# Patient Record
Sex: Female | Born: 1995 | Race: Black or African American | Hispanic: No | Marital: Single | Smoking: Former smoker
Health system: Southern US, Community
[De-identification: ages and names within clinical notes are randomized; demographics above are authoritative.]

## PROBLEM LIST (undated history)

## (undated) ENCOUNTER — Inpatient Hospital Stay (HOSPITAL_COMMUNITY): Payer: Self-pay

## (undated) DIAGNOSIS — J45909 Unspecified asthma, uncomplicated: Secondary | ICD-10-CM

## (undated) DIAGNOSIS — A749 Chlamydial infection, unspecified: Secondary | ICD-10-CM

## (undated) DIAGNOSIS — A549 Gonococcal infection, unspecified: Secondary | ICD-10-CM

## (undated) DIAGNOSIS — A599 Trichomoniasis, unspecified: Secondary | ICD-10-CM

## (undated) DIAGNOSIS — K219 Gastro-esophageal reflux disease without esophagitis: Secondary | ICD-10-CM

## (undated) DIAGNOSIS — R569 Unspecified convulsions: Secondary | ICD-10-CM

## (undated) DIAGNOSIS — L708 Other acne: Secondary | ICD-10-CM

## (undated) DIAGNOSIS — F419 Anxiety disorder, unspecified: Secondary | ICD-10-CM

## (undated) DIAGNOSIS — E282 Polycystic ovarian syndrome: Secondary | ICD-10-CM

## (undated) DIAGNOSIS — I10 Essential (primary) hypertension: Secondary | ICD-10-CM

## (undated) DIAGNOSIS — A4902 Methicillin resistant Staphylococcus aureus infection, unspecified site: Secondary | ICD-10-CM

## (undated) DIAGNOSIS — R519 Headache, unspecified: Secondary | ICD-10-CM

## (undated) HISTORY — PX: ROOT CANAL: SHX2363

## (undated) HISTORY — PX: ADENOIDECTOMY: SUR15

## (undated) HISTORY — PX: TONSILLECTOMY: SUR1361

## (undated) HISTORY — PX: WISDOM TOOTH EXTRACTION: SHX21

## (undated) HISTORY — PX: NASAL SEPTOPLASTY W/ TURBINOPLASTY: SHX2070

## (undated) HISTORY — DX: Essential (primary) hypertension: I10

---
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
## 2016-01-17 ENCOUNTER — Emergency Department (HOSPITAL_COMMUNITY)
Admission: EM | Admit: 2016-01-17 | Discharge: 2016-01-18 | Disposition: A | Discharge status: Home / Self Care | Payer: BLUE CROSS/BLUE SHIELD | Attending: Emergency Medicine | Admitting: Emergency Medicine

## 2016-01-17 ENCOUNTER — Encounter (HOSPITAL_COMMUNITY): Payer: Self-pay | Admitting: *Deleted

## 2016-01-17 DIAGNOSIS — B9689 Other specified bacterial agents as the cause of diseases classified elsewhere: Secondary | ICD-10-CM | POA: Insufficient documentation

## 2016-01-17 DIAGNOSIS — N760 Acute vaginitis: Secondary | ICD-10-CM | POA: Diagnosis not present

## 2016-01-17 DIAGNOSIS — Z79899 Other long term (current) drug therapy: Secondary | ICD-10-CM | POA: Diagnosis not present

## 2016-01-17 DIAGNOSIS — Z91040 Latex allergy status: Secondary | ICD-10-CM | POA: Diagnosis not present

## 2016-01-17 DIAGNOSIS — F17200 Nicotine dependence, unspecified, uncomplicated: Secondary | ICD-10-CM | POA: Diagnosis not present

## 2016-01-17 DIAGNOSIS — N898 Other specified noninflammatory disorders of vagina: Secondary | ICD-10-CM | POA: Diagnosis present

## 2016-01-17 DIAGNOSIS — J45909 Unspecified asthma, uncomplicated: Secondary | ICD-10-CM | POA: Insufficient documentation

## 2016-01-17 HISTORY — DX: Unspecified asthma, uncomplicated: J45.909

## 2016-01-17 LAB — WET PREP, GENITAL
Sperm: NONE SEEN
TRICH WET PREP: NONE SEEN
YEAST WET PREP: NONE SEEN

## 2016-01-17 NOTE — ED Triage Notes (Signed)
Pt c/o vaginal itching, discharge, treated for chlamydia 2 months ago

## 2016-01-17 NOTE — ED Provider Notes (Signed)
[facility]-EMERGENCY DEPT Provider Note   [name]: [id number] Arrival date & time: 01/17/16  2259  By signing my name below, I, Vista Minkobert Ross, attest that this documentation has been prepared under the direction and in the presence of No att. providers found. Electronically signed, Vista Minkobert Ross, ED Scribe. 01/17/16. 11:30 PM.  History   Chief Complaint Chief Complaint  Patient presents with  . Vaginal Itching    HPI HPI Comments: Duffy RhodyBranisha Podolak is a 21 y.o. female, with Hx of vaginosis, chlamydia, who presents to the Emergency Department complaining of persistent vaginal itching and discharge that started last week. Pt states that her symptoms feel similar to prior yeast infection that she had last year. She was recently treated for chlamydia last month. She never had symptoms but was dx and treated. Pt's partner also received treatment for chlamydia. No fever.  The history is provided by the patient. No language interpreter was used.   Past Medical History:  Diagnosis Date  . Asthma    There are no active problems to display for this patient.   Past Surgical History:  Procedure Laterality Date  . TONSILLECTOMY      OB History    No data available       Home Medications    Prior to Admission medications   Medication Sig Start Date End Date Taking? Authorizing Provider  JUNEL FE 1/20 1-20 MG-MCG tablet Take 1 tablet by mouth daily. 12/19/15  Yes Historical Provider, MD  metroNIDAZOLE (FLAGYL) 500 MG tablet Take 1 tablet (500 mg total) by mouth 2 (two) times daily. 01/18/16   Rolland PorterMark Vee Bahe, MD  metroNIDAZOLE (FLAGYL) 500 MG tablet Take 1 tablet (500 mg total) by mouth 2 (two) times daily. 01/18/16   Rolland PorterMark Coriann Brouhard, MD    Family History No family history on file.  Social History Social History  Substance Use Topics  . Smoking status: Current Some Day Smoker  . Smokeless tobacco: Never Used  . Alcohol use No    Allergies   Latex and Red dye   Review of Systems Review of Systems    Constitutional: Negative for appetite change, chills, diaphoresis, fatigue and fever.  HENT: Negative for mouth sores, sore throat and trouble swallowing.   Eyes: Negative for visual disturbance.  Respiratory: Negative for cough, chest tightness, shortness of breath and wheezing.   Cardiovascular: Negative for chest pain.  Gastrointestinal: Negative for abdominal distention, abdominal pain, diarrhea, nausea and vomiting.  Endocrine: Negative for polydipsia, polyphagia and polyuria.  Genitourinary: Positive for vaginal discharge. Negative for dysuria, frequency and hematuria.  Musculoskeletal: Negative for gait problem.  Skin: Negative for color change, pallor and rash.  Neurological: Negative for dizziness, syncope, light-headedness and headaches.  Hematological: Does not bruise/bleed easily.  Psychiatric/Behavioral: Negative for behavioral problems and confusion.     Physical Exam Updated Vital Signs BP 143/95   Pulse 99   Temp 98.9 F (37.2 C) (Oral)   Resp 16   LMP 11/17/2015   SpO2 100%   Physical Exam  Constitutional: She is oriented to person, place, and time. She appears well-developed and well-nourished. No distress.  HENT:  Head: Normocephalic.  Eyes: Conjunctivae are normal. Pupils are equal, round, and reactive to light. No scleral icterus.  Neck: Normal range of motion. Neck supple. No thyromegaly present.  Cardiovascular: Normal rate and regular rhythm.  Exam reveals no gallop and no friction rub.   No murmur heard. Pulmonary/Chest: Effort normal and breath sounds normal. No respiratory distress. She has no wheezes. She  has no rales.  Abdominal: Soft. Bowel sounds are normal. She exhibits no distension. There is no tenderness. There is no rebound.  Genitourinary:  Genitourinary Comments: Pelvic exam: Thick yellow discharge.  No CMT. No external lesions or erythema.  Musculoskeletal: Normal range of motion.  Neurological: She is alert and oriented to person,  place, and time.  Skin: Skin is warm and dry. No rash noted.  Psychiatric: She has a normal mood and affect. Her behavior is normal.     ED Treatments / Results  DIAGNOSTIC STUDIES: Oxygen Saturation is 100% on RA, normal by my interpretation.  COORDINATION OF CARE: 11:29 PM-Discussed treatment plan with pt at bedside and pt agreed to plan.   Labs (all labs ordered are listed, but only abnormal results are displayed) Labs Reviewed  WET PREP, GENITAL - Abnormal; Notable for the following:       Result Value   Clue Cells Wet Prep HPF POC PRESENT (*)    WBC, Wet Prep HPF POC FEW (*)    All other components within normal limits  POC URINE PREG, ED  GC/CHLAMYDIA PROBE AMP ([place]) NOT AT [facility]    EKG  EKG Interpretation None       Radiology No results found.  Procedures Procedures (including critical care time)  Medications Ordered in ED Medications - No data to display   Initial Impression / Assessment and Plan / ED Course  I have reviewed the triage vital signs and the nursing notes.  Pertinent labs & imaging results that were available during my care of the patient were reviewed by me and considered in my medical decision making (see chart for details).  Clinical Course       Final Clinical Impressions(s) / ED Diagnoses   Final diagnoses:  BV (bacterial vaginosis)    New Prescriptions Discharge Medication List as of 01/18/2016 12:37 AM    START taking these medications   Details  metroNIDAZOLE (FLAGYL) 500 MG tablet Take 1 tablet (500 mg total) by mouth 2 (two) times daily., Starting Thu 01/18/2016, Print      I personally performed the services described in this documentation, which was scribed in my presence. The recorded information has been reviewed and is accurate.     Rolland Porter, MD 01/18/16 [phone]

## 2016-01-18 DIAGNOSIS — N760 Acute vaginitis: Secondary | ICD-10-CM | POA: Diagnosis not present

## 2016-01-18 LAB — POC URINE PREG, ED: PREG TEST UR: NEGATIVE

## 2016-01-18 LAB — GC/CHLAMYDIA PROBE AMP (~~LOC~~) NOT AT ARMC
CHLAMYDIA, DNA PROBE: NEGATIVE
Neisseria Gonorrhea: NEGATIVE

## 2016-01-18 MED ORDER — METRONIDAZOLE 500 MG PO TABS: 500.0000 mg | ORAL_TABLET | Freq: Two times a day (BID) | ORAL | 0 refills | Status: DC

## 2016-01-18 NOTE — ED Notes (Signed)
Patient is alert and oriented x3.  She was given DC instructions and follow up visit instructions.  Patient gave verbal understanding. She was DC ambulatory under her own power to home.  V/S stable.  He was not showing any signs of distress on DC 

## 2016-01-18 NOTE — Discharge Instructions (Signed)
Flagyl prescription 10 days. Absolute no alcohol while taking Flagyl.  I recommend OB/GYN follow-up. GYN maybe able to explain your recurrent episodes of BV, and possibly help prevent future episodes.  You have been given the name and number of the [facility]. Call for outpatient appointment.

## 2016-09-14 ENCOUNTER — Encounter (HOSPITAL_COMMUNITY): Payer: Self-pay | Admitting: Nurse Practitioner

## 2016-09-14 DIAGNOSIS — A599 Trichomoniasis, unspecified: Secondary | ICD-10-CM | POA: Diagnosis not present

## 2016-09-14 DIAGNOSIS — J45909 Unspecified asthma, uncomplicated: Secondary | ICD-10-CM | POA: Insufficient documentation

## 2016-09-14 DIAGNOSIS — Z91040 Latex allergy status: Secondary | ICD-10-CM | POA: Diagnosis not present

## 2016-09-14 DIAGNOSIS — F17200 Nicotine dependence, unspecified, uncomplicated: Secondary | ICD-10-CM | POA: Insufficient documentation

## 2016-09-14 DIAGNOSIS — B9689 Other specified bacterial agents as the cause of diseases classified elsewhere: Secondary | ICD-10-CM | POA: Insufficient documentation

## 2016-09-14 DIAGNOSIS — N760 Acute vaginitis: Secondary | ICD-10-CM | POA: Insufficient documentation

## 2016-09-14 DIAGNOSIS — B373 Candidiasis of vulva and vagina: Secondary | ICD-10-CM | POA: Insufficient documentation

## 2016-09-14 DIAGNOSIS — N898 Other specified noninflammatory disorders of vagina: Secondary | ICD-10-CM | POA: Diagnosis present

## 2016-09-14 NOTE — ED Triage Notes (Signed)
Pt is reporting that she has colorless, odorless vaginal discharge and discomfort. Onset she reports as a week ago, adds that she is sexually active, unprotected sex would like to checked for STD and pregnancy.

## 2016-09-15 ENCOUNTER — Emergency Department (HOSPITAL_COMMUNITY)
Admission: EM | Admit: 2016-09-15 | Discharge: 2016-09-15 | Disposition: A | Discharge status: Home / Self Care | Payer: Medicaid Other | Attending: Emergency Medicine | Admitting: Emergency Medicine

## 2016-09-15 DIAGNOSIS — B3731 Acute candidiasis of vulva and vagina: Secondary | ICD-10-CM

## 2016-09-15 DIAGNOSIS — B373 Candidiasis of vulva and vagina: Secondary | ICD-10-CM

## 2016-09-15 DIAGNOSIS — N760 Acute vaginitis: Secondary | ICD-10-CM

## 2016-09-15 DIAGNOSIS — B9689 Other specified bacterial agents as the cause of diseases classified elsewhere: Secondary | ICD-10-CM

## 2016-09-15 DIAGNOSIS — A599 Trichomoniasis, unspecified: Secondary | ICD-10-CM

## 2016-09-15 LAB — URINALYSIS, ROUTINE W REFLEX MICROSCOPIC
Bacteria, UA: NONE SEEN
Bilirubin Urine: NEGATIVE
Glucose, UA: NEGATIVE mg/dL
Hgb urine dipstick: NEGATIVE
Ketones, ur: NEGATIVE mg/dL
NITRITE: NEGATIVE
PH: 8 (ref 5.0–8.0)
PROTEIN: 30 mg/dL — AB
SPECIFIC GRAVITY, URINE: 1.024 (ref 1.005–1.030)

## 2016-09-15 LAB — WET PREP, GENITAL
Sperm: NEGATIVE
Trich, Wet Prep: NEGATIVE — AB

## 2016-09-15 LAB — RPR: RPR: NONREACTIVE

## 2016-09-15 LAB — PREGNANCY, URINE: PREG TEST UR: NEGATIVE

## 2016-09-15 LAB — HIV ANTIBODY (ROUTINE TESTING W REFLEX): HIV SCREEN 4TH GENERATION: NONREACTIVE

## 2016-09-15 MED ORDER — METRONIDAZOLE 500 MG PO TABS
500.0000 mg | ORAL_TABLET | Freq: Once | ORAL | Status: AC
  Administered 2016-09-15: 500 mg via ORAL
  Filled 2016-09-15: qty 1

## 2016-09-15 MED ORDER — METRONIDAZOLE 500 MG PO TABS: 500.0000 mg | ORAL_TABLET | Freq: Two times a day (BID) | ORAL | 0 refills | Status: DC

## 2016-09-15 MED ORDER — FLUCONAZOLE 100 MG PO TABS
100.0000 mg | ORAL_TABLET | Freq: Once | ORAL | Status: AC
  Administered 2016-09-15: 100 mg via ORAL
  Filled 2016-09-15: qty 1

## 2016-09-15 NOTE — Discharge Instructions (Signed)
You were not tested for all STDs today. Your gonorrhea and chlamydia tests are pending- if they are positive, you will receive a phone call. Refrain from sex until you have the results from a full STD screen. Please go to [facility] (Address: [address], [place], [place] [address] Phone: [phone]) or see the [facility] (Address: [address] Phone: [phone]) for full STD screening. Return to the emergency room for worsening of symptoms, fever, and vomiting.

## 2016-09-15 NOTE — ED Provider Notes (Signed)
[facility]-EMERGENCY DEPT Provider Note   [name]: [id number] Arrival date & time: 09/14/16  2209     History   Chief Complaint Chief Complaint  Patient presents with  . Vaginal Discharge     HPI   Blood pressure 135/90, pulse 79, temperature 98.2 F (36.8 C), temperature source Oral, resp. rate 18, height 5\' 4"  (1.626 m), weight 123.8 kg (273 lb), SpO2 100 %.  Colleen Stone is a 21 y.o. female complaining of colorless odorless vaginal discharge onset approximately 4 days ago. She regularly has unprotected sex with her boyfriend, she is monogamous with him. She denies any abdominal pain, fever, chills, nausea, vomiting, dysuria, hematuria, urinary frequency. She states that she does not have an OB/GYN and is frequently affected with bacterial vaginosis.  Past Medical History:  Diagnosis Date  . Asthma     There are no active problems to display for this patient.   Past Surgical History:  Procedure Laterality Date  . TONSILLECTOMY      OB History    No data available       Home Medications    Prior to Admission medications   Medication Sig Start Date End Date Taking? Authorizing Provider  metroNIDAZOLE (FLAGYL) 500 MG tablet Take 1 tablet (500 mg total) by mouth 2 (two) times daily. One tab PO bid x 10 days 09/15/16   Percy Winterrowd, Mardella Layman    Family History History reviewed. No pertinent family history.  Social History Social History  Substance Use Topics  . Smoking status: Current Some Day Smoker  . Smokeless tobacco: Never Used  . Alcohol use No     Allergies   Latex and Red dye   Review of Systems Review of Systems  A complete review of systems was obtained and all systems are negative except as noted in the HPI and PMH.    Physical Exam Updated Vital Signs BP (!) 145/93 (BP Location: Left Arm)   Pulse 88   Temp 98.2 F (36.8 C) (Oral)   Resp 17   Ht 5\' 4"  (1.626 m)   Wt 123.8 kg (273 lb)   SpO2 100%   BMI 46.86 kg/m   Physical Exam    Constitutional: She is oriented to person, place, and time. She appears well-developed and well-nourished. No distress.  HENT:  Head: Normocephalic and atraumatic.  Mouth/Throat: Oropharynx is clear and moist.  Eyes: Pupils are equal, round, and reactive to light. Conjunctivae and EOM are normal.  Neck: Normal range of motion.  Cardiovascular: Normal rate, regular rhythm and intact distal pulses.   Pulmonary/Chest: Effort normal and breath sounds normal.  Abdominal: Soft. She exhibits no distension and no mass. There is no tenderness. There is no rebound and no guarding. No hernia.  Genitourinary:  Genitourinary Comments: Pelvic exam a chaperoned by technician: No rashes or lesions, scant, non-foul-smelling discharge no cervical motion or adnexal tenderness.  Musculoskeletal: Normal range of motion.  Neurological: She is alert and oriented to person, place, and time.  Skin: Capillary refill takes less than 2 seconds. She is not diaphoretic.  Psychiatric: She has a normal mood and affect.  Nursing note and vitals reviewed.    ED Treatments / Results  Labs (all labs ordered are listed, but only abnormal results are displayed) Labs Reviewed  WET PREP, GENITAL - Abnormal; Notable for the following:       Result Value   Yeast Wet Prep HPF POC PRESENT (*)    Trich, Wet Prep NEGATIVE (*)  Clue Cells Wet Prep HPF POC PRESENT (*)    WBC, Wet Prep HPF POC MODERATE (*)    All other components within normal limits  URINALYSIS, ROUTINE W REFLEX MICROSCOPIC - Abnormal; Notable for the following:    APPearance CLOUDY (*)    Protein, ur 30 (*)    Leukocytes, UA MODERATE (*)    Squamous Epithelial / LPF 0-5 (*)    All other components within normal limits  PREGNANCY, URINE  RPR  HIV ANTIBODY (ROUTINE TESTING)  GC/CHLAMYDIA PROBE AMP ([place]) NOT AT [facility]    EKG  EKG Interpretation None       Radiology No results found.  Procedures Procedures (including critical care  time)  Medications Ordered in ED Medications  fluconazole (DIFLUCAN) tablet 100 mg (100 mg Oral Given 09/15/16 0431)  metroNIDAZOLE (FLAGYL) tablet 500 mg (500 mg Oral Given 09/15/16 0431)     Initial Impression / Assessment and Plan / ED Course  I have reviewed the triage vital signs and the nursing notes.  Pertinent labs & imaging results that were available during my care of the patient were reviewed by me and considered in my medical decision making (see chart for details).     Vitals:   09/14/16 2244 09/15/16 0413  BP: 135/90 (!) 145/93  Pulse: 79 88  Resp: 18 17  Temp: 98.2 F (36.8 C)   TempSrc: Oral   SpO2: 100% 100%  Weight: 123.8 kg (273 lb)   Height: 5\' 4"  (1.626 m)     Medications  fluconazole (DIFLUCAN) tablet 100 mg (100 mg Oral Given 09/15/16 0431)  metroNIDAZOLE (FLAGYL) tablet 500 mg (500 mg Oral Given 09/15/16 0431)    Colleen Stone is 21 y.o. female presenting with Vaginal discharge onset several days ago. Abdominal exam is benign, no signs of systemic infection.  Wet prep with yeast, trichomoniasis and clue cells. Urinalysis with moderate leukocytes negative nitrites, think this is likely contamination from the vaginal area. She's having no urinary symptoms. Advised patient to refrain from sex will she is being treated for trichomonas and advised her her partner will need treatment as well. [facility].  Evaluation does not show pathology that would require ongoing emergent intervention or inpatient treatment. Pt is hemodynamically stable and mentating appropriately. Discussed findings and plan with patient/guardian, who agrees with care plan. All questions answered. Return precautions discussed and outpatient follow up given.    Final Clinical Impressions(s) / ED Diagnoses   Final diagnoses:  BV (bacterial vaginosis)  Trichimoniasis  Yeast infection of the vagina    New Prescriptions New Prescriptions   METRONIDAZOLE (FLAGYL)  500 MG TABLET    Take 1 tablet (500 mg total) by mouth 2 (two) times daily. One tab PO bid x 10 days     Kaylyn Limisciotta, Nieves Chapa, PA-C 09/15/16 0444    Mancel BaleWentz, Elliott, MD 09/15/16 [id number]

## 2016-09-19 LAB — GC/CHLAMYDIA PROBE AMP (~~LOC~~) NOT AT ARMC
CHLAMYDIA, DNA PROBE: POSITIVE — AB
Neisseria Gonorrhea: NEGATIVE

## 2016-09-20 ENCOUNTER — Telehealth: Payer: Self-pay | Admitting: Student

## 2016-09-20 DIAGNOSIS — A749 Chlamydial infection, unspecified: Secondary | ICD-10-CM

## 2016-09-20 MED ORDER — AZITHROMYCIN 500 MG PO TABS: 1000.0000 mg | ORAL_TABLET | Freq: Once | ORAL | 0 refills | Status: AC

## 2016-09-20 NOTE — Telephone Encounter (Addendum)
Colleen Stone tested positive for  Chlamydia. Patient was called by RN and allergies and pharmacy confirmed. Rx sent to pharmacy of choice.   Judeth HornLawrence, Alishia Lebo, NP 09/20/2016 3:36 PM          ----- Message from Kathe BectonLori S Berdik, RN sent at 09/20/2016  3:34 PM EDT ----- This patient tested positive for :"Chlamydia",   She :","is allergic to latex, Red dye , I have informed the patient of her results and confirmed her pharmacy is correct in her chart. Please send Rx.   Thank you,   Kathe BectonBerdik, Lori S, RN   [facility].

## 2016-10-04 ENCOUNTER — Encounter (HOSPITAL_COMMUNITY): Payer: Self-pay | Admitting: Emergency Medicine

## 2016-10-04 DIAGNOSIS — N898 Other specified noninflammatory disorders of vagina: Secondary | ICD-10-CM | POA: Insufficient documentation

## 2016-10-04 DIAGNOSIS — N730 Acute parametritis and pelvic cellulitis: Secondary | ICD-10-CM | POA: Insufficient documentation

## 2016-10-04 DIAGNOSIS — R1030 Lower abdominal pain, unspecified: Secondary | ICD-10-CM | POA: Diagnosis present

## 2016-10-04 DIAGNOSIS — J45909 Unspecified asthma, uncomplicated: Secondary | ICD-10-CM | POA: Insufficient documentation

## 2016-10-04 DIAGNOSIS — F17210 Nicotine dependence, cigarettes, uncomplicated: Secondary | ICD-10-CM | POA: Insufficient documentation

## 2016-10-04 DIAGNOSIS — Z7251 High risk heterosexual behavior: Secondary | ICD-10-CM | POA: Insufficient documentation

## 2016-10-04 DIAGNOSIS — Z91040 Latex allergy status: Secondary | ICD-10-CM | POA: Diagnosis not present

## 2016-10-04 LAB — LIPASE, BLOOD: LIPASE: 26 U/L (ref 11–51)

## 2016-10-04 LAB — URINALYSIS, ROUTINE W REFLEX MICROSCOPIC
BILIRUBIN URINE: NEGATIVE
GLUCOSE, UA: NEGATIVE mg/dL
HGB URINE DIPSTICK: NEGATIVE
KETONES UR: NEGATIVE mg/dL
LEUKOCYTES UA: NEGATIVE
NITRITE: NEGATIVE
Protein, ur: 30 mg/dL — AB
Specific Gravity, Urine: 1.026 (ref 1.005–1.030)
pH: 5 (ref 5.0–8.0)

## 2016-10-04 LAB — CBC
HEMATOCRIT: 40.6 % (ref 36.0–46.0)
HEMOGLOBIN: 13.4 g/dL (ref 12.0–15.0)
MCH: 27 pg (ref 26.0–34.0)
MCHC: 33 g/dL (ref 30.0–36.0)
MCV: 81.7 fL (ref 78.0–100.0)
Platelets: 341 10*3/uL (ref 150–400)
RBC: 4.97 MIL/uL (ref 3.87–5.11)
RDW: 14.4 % (ref 11.5–15.5)
WBC: 7.1 10*3/uL (ref 4.0–10.5)

## 2016-10-04 LAB — I-STAT BETA HCG BLOOD, ED (MC, WL, AP ONLY): I-stat hCG, quantitative: <5 m[IU]/mL (ref <5)

## 2016-10-04 LAB — COMPREHENSIVE METABOLIC PANEL
ALT: 19 U/L (ref 14–54)
ANION GAP: 7 (ref 5–15)
AST: 20 U/L (ref 15–41)
Albumin: 3.5 g/dL (ref 3.5–5.0)
Alkaline Phosphatase: 84 U/L (ref 38–126)
BILIRUBIN TOTAL: 0.5 mg/dL (ref 0.3–1.2)
BUN: 9 mg/dL (ref 6–20)
CHLORIDE: 107 mmol/L (ref 101–111)
CO2: 23 mmol/L (ref 22–32)
Calcium: 9.3 mg/dL (ref 8.9–10.3)
Creatinine, Ser: 0.65 mg/dL (ref 0.44–1.00)
GFR calc Af Amer: >60 mL/min (ref >60)
Glucose, Bld: 80 mg/dL (ref 65–99)
POTASSIUM: 3.8 mmol/L (ref 3.5–5.1)
Sodium: 137 mmol/L (ref 135–145)
TOTAL PROTEIN: 7 g/dL (ref 6.5–8.1)

## 2016-10-04 NOTE — ED Triage Notes (Signed)
Reports taking a pregnancy test yesterday that was positive.  Reports having lower abdominal pain that started today with discharge with milky thick discharge.  Reports being treated for std's on the 2nd but has had unprotected sex with same partner since then.  Unsure if he was treated or not.  Denies having any bleeding.

## 2016-10-05 ENCOUNTER — Emergency Department (HOSPITAL_COMMUNITY)
Admission: EM | Admit: 2016-10-05 | Discharge: 2016-10-05 | Disposition: A | Discharge status: Home / Self Care | Payer: Medicaid Other | Attending: Emergency Medicine | Admitting: Emergency Medicine

## 2016-10-05 DIAGNOSIS — N730 Acute parametritis and pelvic cellulitis: Secondary | ICD-10-CM

## 2016-10-05 DIAGNOSIS — Z7251 High risk heterosexual behavior: Secondary | ICD-10-CM

## 2016-10-05 LAB — WET PREP, GENITAL
SPERM: NONE SEEN
Trich, Wet Prep: NONE SEEN
YEAST WET PREP: NONE SEEN

## 2016-10-05 MED ORDER — DOXYCYCLINE HYCLATE 100 MG PO CAPS: 100.0000 mg | ORAL_CAPSULE | Freq: Two times a day (BID) | ORAL | 0 refills | Status: DC

## 2016-10-05 MED ORDER — NAPROXEN 500 MG PO TABS: 500.0000 mg | ORAL_TABLET | Freq: Two times a day (BID) | ORAL | 0 refills | Status: DC

## 2016-10-05 MED ORDER — LIDOCAINE HCL (PF) 1 % IJ SOLN
INTRAMUSCULAR | Status: AC
  Filled 2016-10-05: qty 5

## 2016-10-05 MED ORDER — PROMETHAZINE HCL 25 MG PO TABS
25.0000 mg | ORAL_TABLET | Freq: Once | ORAL | Status: AC
  Administered 2016-10-05: 25 mg via ORAL
  Filled 2016-10-05: qty 1

## 2016-10-05 MED ORDER — CEFTRIAXONE SODIUM 250 MG IJ SOLR
250.0000 mg | Freq: Once | INTRAMUSCULAR | Status: AC
  Administered 2016-10-05: 250 mg via INTRAMUSCULAR
  Filled 2016-10-05: qty 250

## 2016-10-05 MED ORDER — DOXYCYCLINE HYCLATE 100 MG PO TABS
100.0000 mg | ORAL_TABLET | Freq: Once | ORAL | Status: AC
  Administered 2016-10-05: 100 mg via ORAL
  Filled 2016-10-05: qty 1

## 2016-10-05 NOTE — ED Provider Notes (Signed)
[facility]-EMERGENCY DEPT Provider Note   [name]: [id number] Arrival date & time: 10/04/16  2035    History   Chief Complaint Chief Complaint  Patient presents with  . Abdominal Pain    HPI Colleen Stone is a 21 y.o. female.  21 year old female presents to the emergency department for evaluation of lower abdominal pain. Symptoms began this morning, preceded by vaginal discharge 2 days. She describes a milky, thick discharge. She was recently tested and treated for chlamydia 3 weeks ago. She reports telling her partner to be tested and treated, but she is unsure if he followed through with this. She has been sexually active with this partner since her treatment 3 weeks ago. Intercourse has been unprotected. She describes a cramping suprapubic discomfort. No medications taken PTA for symptoms. No vaginal bleeding, N/V, fevers, urinary symptoms. No hx of abdominal surgeries.   The history is provided by the patient. No language interpreter was used.  Abdominal Pain   Pertinent negatives include fever and vomiting.    Past Medical History:  Diagnosis Date  . Asthma     There are no active problems to display for this patient.   Past Surgical History:  Procedure Laterality Date  . TONSILLECTOMY      OB History    No data available       Home Medications    Prior to Admission medications   Medication Sig Start Date End Date Taking? Authorizing Provider  doxycycline (VIBRAMYCIN) 100 MG capsule Take 1 capsule (100 mg total) by mouth 2 (two) times daily. Take as prescribed until finished 10/05/16   Antony Madura, PA-C  metroNIDAZOLE (FLAGYL) 500 MG tablet Take 1 tablet (500 mg total) by mouth 2 (two) times daily. One tab PO bid x 10 days Patient not taking: Reported on 10/05/2016 09/15/16   Pisciotta, Joni Reining, PA-C  naproxen (NAPROSYN) 500 MG tablet Take 1 tablet (500 mg total) by mouth 2 (two) times daily. 10/05/16   Antony Madura, PA-C    Family History No family history on  file.  Social History Social History  Substance Use Topics  . Smoking status: Current Some Day Smoker  . Smokeless tobacco: Never Used  . Alcohol use No     Allergies   Latex and Red dye   Review of Systems Review of Systems  Constitutional: Negative for fever.  Gastrointestinal: Positive for abdominal pain. Negative for vomiting.  Genitourinary: Positive for vaginal discharge.  Ten systems reviewed and are negative for acute change, except as noted in the HPI.    Physical Exam Updated Vital Signs BP (!) 128/91 (BP Location: Left Arm)   Pulse 86   Temp 98.2 F (36.8 C) (Oral)   Resp 18   Ht 5' 4.5" (1.638 m)   Wt 123.8 kg (273 lb)   LMP 08/26/2016   SpO2 100%   BMI 46.14 kg/m   Physical Exam  Constitutional: She is oriented to person, place, and time. She appears well-developed and well-nourished. No distress.  Nontoxic and in NAD  HENT:  Head: Normocephalic and atraumatic.  Eyes: Conjunctivae and EOM are normal. No scleral icterus.  Neck: Normal range of motion.  Cardiovascular: Normal rate, regular rhythm and intact distal pulses.   Pulmonary/Chest: Effort normal. No respiratory distress.  Respirations even and unlabored  Abdominal: Soft. She exhibits no mass. There is tenderness (minimal, suprapubic). There is no guarding.  Obese abdomen without peritoneal signs.  Genitourinary: There is no rash, tenderness or lesion on the right labia. There is  no rash, tenderness or lesion on the left labia. Uterus is tender. Cervix exhibits no motion tenderness and no friability. Right adnexum displays no mass and no tenderness. Left adnexum displays tenderness (minimal). Left adnexum displays no mass. Vaginal discharge (white) found.  Musculoskeletal: Normal range of motion.  Neurological: She is alert and oriented to person, place, and time. She exhibits normal muscle tone. Coordination normal.  Skin: Skin is warm and dry. No rash noted. She is not diaphoretic. No erythema.  No pallor.  Psychiatric: She has a normal mood and affect. Her behavior is normal.  Nursing note and vitals reviewed.    ED Treatments / Results  Labs (all labs ordered are listed, but only abnormal results are displayed) Labs Reviewed  WET PREP, GENITAL - Abnormal; Notable for the following:       Result Value   Clue Cells Wet Prep HPF POC PRESENT (*)    WBC, Wet Prep HPF POC MANY (*)    All other components within normal limits  URINALYSIS, ROUTINE W REFLEX MICROSCOPIC - Abnormal; Notable for the following:    APPearance HAZY (*)    Protein, ur 30 (*)    Bacteria, UA RARE (*)    Squamous Epithelial / LPF 0-5 (*)    All other components within normal limits  LIPASE, BLOOD  COMPREHENSIVE METABOLIC PANEL  CBC  I-STAT BETA HCG BLOOD, ED ([facility], [facility], [facility] ONLY)  GC/CHLAMYDIA PROBE AMP ([place]) NOT AT [facility]    EKG  EKG Interpretation None       Radiology No results found.  Procedures Procedures (including critical care time)  Medications Ordered in ED Medications  cefTRIAXone (ROCEPHIN) injection 250 mg (not administered)  doxycycline (VIBRA-TABS) tablet 100 mg (not administered)  promethazine (PHENERGAN) tablet 25 mg (not administered)     Initial Impression / Assessment and Plan / ED Course  I have reviewed the triage vital signs and the nursing notes.  Pertinent labs & imaging results that were available during my care of the patient were reviewed by me and considered in my medical decision making (see chart for details).     21 year old female presents to the emergency department for evaluation of pelvic pain with associated vaginal discharge. She was treated for chlamydia 3 weeks ago, but has had unprotected sex with the same partner since her treatment and is unsure if he was treated for STDs as well. Patient has mild uterine tenderness. There is minimal left adnexal tenderness. No masses or peritoneal signs on exam.The patient is afebrile with a reassuring  laboratory workup. Wet prep does show many white blood cells. There is concern for underlying repeat STI infection. Given pelvic pain, will treat for pelvic inflammatory disease. I have had a long conversation with the patient regarding safe sex practices. She is to follow-up with the health department in 48 hours regarding the results of her STD tests. Return precautions discussed and provided. Patient discharged in stable condition with no unaddressed concerns.   Final Clinical Impressions(s) / ED Diagnoses   Final diagnoses:  PID (acute pelvic inflammatory disease)  History of unprotected sex    New Prescriptions New Prescriptions   DOXYCYCLINE (VIBRAMYCIN) 100 MG CAPSULE    Take 1 capsule (100 mg total) by mouth 2 (two) times daily. Take as prescribed until finished   NAPROXEN (NAPROSYN) 500 MG TABLET    Take 1 tablet (500 mg total) by mouth 2 (two) times daily.     Antony Madura, PA-C 10/05/16 1610  Geoffery Lyons, MD 10/05/16 [phone]

## 2016-10-05 NOTE — ED Notes (Signed)
Patient able to ambulate independently  

## 2016-10-05 NOTE — Discharge Instructions (Signed)
Your work up today was concerning for possible STD exposure. You have been treated for gonorrhea and chlamydia today. Continue doxycycline as prescribed for management of pelvic inflammatory disease. Follow-up with the health department in 48 hours for the results of your STD tests. If you test positive for STDs, notify all sexual partners of their need to be tested and treated as well. Do not engage in sexual intercourse for one week after completion of treatment. Use a condom when sexually active.

## 2016-10-07 LAB — GC/CHLAMYDIA PROBE AMP (~~LOC~~) NOT AT ARMC
Chlamydia: NEGATIVE
Neisseria Gonorrhea: NEGATIVE

## 2016-10-29 ENCOUNTER — Emergency Department (HOSPITAL_COMMUNITY): Payer: Medicaid Other

## 2016-10-29 ENCOUNTER — Encounter (HOSPITAL_COMMUNITY): Payer: Self-pay | Admitting: Emergency Medicine

## 2016-10-29 ENCOUNTER — Emergency Department (HOSPITAL_COMMUNITY)
Admission: EM | Admit: 2016-10-29 | Discharge: 2016-10-29 | Disposition: A | Discharge status: Home / Self Care | Payer: Medicaid Other | Attending: Emergency Medicine | Admitting: Emergency Medicine

## 2016-10-29 DIAGNOSIS — R55 Syncope and collapse: Secondary | ICD-10-CM | POA: Diagnosis not present

## 2016-10-29 DIAGNOSIS — Z91040 Latex allergy status: Secondary | ICD-10-CM | POA: Insufficient documentation

## 2016-10-29 DIAGNOSIS — Z79899 Other long term (current) drug therapy: Secondary | ICD-10-CM | POA: Diagnosis not present

## 2016-10-29 DIAGNOSIS — R079 Chest pain, unspecified: Secondary | ICD-10-CM | POA: Insufficient documentation

## 2016-10-29 DIAGNOSIS — F17200 Nicotine dependence, unspecified, uncomplicated: Secondary | ICD-10-CM | POA: Diagnosis not present

## 2016-10-29 DIAGNOSIS — R42 Dizziness and giddiness: Secondary | ICD-10-CM | POA: Insufficient documentation

## 2016-10-29 DIAGNOSIS — J45909 Unspecified asthma, uncomplicated: Secondary | ICD-10-CM | POA: Insufficient documentation

## 2016-10-29 DIAGNOSIS — R300 Dysuria: Secondary | ICD-10-CM | POA: Diagnosis not present

## 2016-10-29 LAB — D-DIMER, QUANTITATIVE: D-Dimer, Quant: 1.5 ug/mL-FEU — ABNORMAL HIGH (ref 0.00–0.50)

## 2016-10-29 LAB — CBC WITH DIFFERENTIAL/PLATELET
BASOS ABS: 0 10*3/uL (ref 0.0–0.1)
Basophils Relative: 0 %
Eosinophils Absolute: 0.1 10*3/uL (ref 0.0–0.7)
Eosinophils Relative: 1 %
HEMATOCRIT: 40 % (ref 36.0–46.0)
HEMOGLOBIN: 13.3 g/dL (ref 12.0–15.0)
LYMPHS ABS: 3.9 10*3/uL (ref 0.7–4.0)
Lymphocytes Relative: 48 %
MCH: 26.9 pg (ref 26.0–34.0)
MCHC: 33.3 g/dL (ref 30.0–36.0)
MCV: 81 fL (ref 78.0–100.0)
Monocytes Absolute: 0.4 10*3/uL (ref 0.1–1.0)
Monocytes Relative: 5 %
Neutro Abs: 3.9 10*3/uL (ref 1.7–7.7)
Neutrophils Relative %: 46 %
Platelets: 354 10*3/uL (ref 150–400)
RBC: 4.94 MIL/uL (ref 3.87–5.11)
RDW: 14 % (ref 11.5–15.5)
WBC: 8.3 10*3/uL (ref 4.0–10.5)

## 2016-10-29 LAB — BASIC METABOLIC PANEL
ANION GAP: 8 (ref 5–15)
BUN: 12 mg/dL (ref 6–20)
CHLORIDE: 107 mmol/L (ref 101–111)
CO2: 24 mmol/L (ref 22–32)
Calcium: 9.5 mg/dL (ref 8.9–10.3)
Creatinine, Ser: 0.72 mg/dL (ref 0.44–1.00)
GFR calc Af Amer: >60 mL/min (ref >60)
GLUCOSE: 91 mg/dL (ref 65–99)
POTASSIUM: 3.8 mmol/L (ref 3.5–5.1)
Sodium: 139 mmol/L (ref 135–145)

## 2016-10-29 LAB — URINALYSIS, ROUTINE W REFLEX MICROSCOPIC
Bacteria, UA: NONE SEEN
Bilirubin Urine: NEGATIVE
Glucose, UA: NEGATIVE mg/dL
Ketones, ur: NEGATIVE mg/dL
Nitrite: NEGATIVE
Protein, ur: NEGATIVE mg/dL
Specific Gravity, Urine: 1.02 (ref 1.005–1.030)
pH: 5 (ref 5.0–8.0)

## 2016-10-29 LAB — I-STAT BETA HCG BLOOD, ED (MC, WL, AP ONLY): I-stat hCG, quantitative: <5 m[IU]/mL (ref <5)

## 2016-10-29 MED ORDER — FLUCONAZOLE 100 MG PO TABS
150.0000 mg | ORAL_TABLET | Freq: Once | ORAL | Status: AC
  Administered 2016-10-29: 150 mg via ORAL
  Filled 2016-10-29: qty 2

## 2016-10-29 MED ORDER — IOPAMIDOL (ISOVUE-370) INJECTION 76%
INTRAVENOUS | Status: AC
  Administered 2016-10-29: 100 mL
  Filled 2016-10-29: qty 100

## 2016-10-29 MED ORDER — NAPROXEN 250 MG PO TABS
500.0000 mg | ORAL_TABLET | Freq: Once | ORAL | Status: AC
  Administered 2016-10-29: 500 mg via ORAL
  Filled 2016-10-29: qty 2

## 2016-10-29 MED ORDER — SULFAMETHOXAZOLE-TRIMETHOPRIM 800-160 MG PO TABS: 1.0000 | ORAL_TABLET | Freq: Once | ORAL | Status: DC

## 2016-10-29 NOTE — ED Triage Notes (Signed)
Patient reported that she passed out yesterday afternoon witnessed by her brother at home   , pt. can not recall incident , alert and oriented at arrival , denies injury , ambulatory/respirations unlabored .

## 2016-10-29 NOTE — ED Notes (Signed)
Nurse will stick.

## 2016-10-29 NOTE — Discharge Instructions (Signed)
Follow-up with neurology for reevaluation of your possible seizures. Follow-up with OB/GYN. Return to the ED immediately if any concerning signs or symptoms develop such as numbness, weakness, slurred speech, or if you have worsening abdominal pain, fevers, or pain radiating up the back because this may be a sign of worsening UTI.

## 2016-10-29 NOTE — ED Notes (Signed)
Pt difficult stick, needs IV for CTA.

## 2016-10-29 NOTE — ED Notes (Signed)
Pt transported to CT ?

## 2016-10-29 NOTE — ED Provider Notes (Signed)
[facility] EMERGENCY DEPARTMENT Provider Note   [name]: [id number] Arrival date & time: 10/29/16  0502     History   Chief Complaint Chief Complaint  Patient presents with  . Passed Out    HPI Colleen Stone is a 21 y.o. female with history of asthma who presents today with chief complaint acute onset, resolved episodes of syncope. She states that yesterday at around 5 PM she was at home and walking when she began feeling acutely lightheaded and nauseous and states "I tried to go sit down. Then I woke up on the couch. My brother says that he hit the floor and he had to carry me to the couch". She states the episode occurred around 4 PM and that when she awoke it was night fall. She is unsure how long she lost consciousness, and she is unsure if there was any seizure-like activity involved. She states she has a history of focal seizures for which she was taking Depakote but states she has not taken this in over 6 months and does not have follow-up with a neurologist. She states she had a second episode 2 hours prior to arrival in which she got up to get a glass of water and then fell to the ground. She states this episode lasted less than 30 minutes. Currently she endorses a mild generalized headache, denies vision changes, CP, SOB,abdominal pain, nausea, or vomiting. Denies neck or back pain, numbness, tingling, or weakness. She was recently treated for PID and completed her course of doxycycline 10 days ago. She does endorse urinary symptoms of dysuria and frequency for the past several days. She also endorses lower crampy abdominal pain but states this is consistent with her cycle. She is on estrogen patch for birth control, but states she is not currently on it for her cycle. Denies hemoptysis, recent travel or surgeries, prior history of DVT or PE. She does smoke marijuana, but denies any other drug use. Denies alcohol use or cigarette smoking.  The history is provided by  the patient.    Past Medical History:  Diagnosis Date  . Asthma     There are no active problems to display for this patient.   Past Surgical History:  Procedure Laterality Date  . TONSILLECTOMY      OB History    No data available       Home Medications    Prior to Admission medications   Medication Sig Start Date End Date Taking? Authorizing Provider  norelgestromin-ethinyl estradiol Burr Medico) 150-35 MCG/24HR transdermal patch Place 1 patch onto the skin once a week. Tuesdays   Yes [provider]    Family History No family history on file.  Social History Social History  Substance Use Topics  . Smoking status: Current Some Day Smoker  . Smokeless tobacco: Never Used  . Alcohol use No     Allergies   Latex and Red dye   Review of Systems Review of Systems  Constitutional: Negative for chills and fever.  Eyes: Negative for visual disturbance.  Respiratory: Negative for cough and shortness of breath.   Cardiovascular: Negative for chest pain.  Gastrointestinal: Positive for abdominal pain. Negative for nausea and vomiting.  Genitourinary: Positive for dysuria and frequency. Negative for hematuria.  Musculoskeletal: Negative for back pain and neck pain.  Neurological: Positive for syncope, light-headedness and headaches. Negative for weakness and numbness.  All other systems reviewed and are negative.    Physical Exam Updated Vital Signs BP 119/81 (  BP Location: Right Arm)   Pulse 84   Temp 98.2 F (36.8 C) (Oral)   Resp (!) 25   Ht  (1.626 m)   Wt 119.3 kg (263 lb)   LMP 10/28/2016   SpO2 99%   BMI 45.14 kg/m   Physical Exam  Constitutional: She appears well-developed and well-nourished. No distress.  HENT:  Head: Normocephalic and atraumatic.  Right Ear: External ear normal.  Left Ear: External ear normal.  No Battle's signs, no raccoon's eyes, no rhinorrhea. No hemotympanum. No tenderness to palpation of the face or skull.  No deformity, crepitus, or swelling noted.   Eyes: Pupils are equal, round, and reactive to light. Conjunctivae and EOM are normal. Right eye exhibits no discharge. Left eye exhibits no discharge.  Neck: No JVD present. No tracheal deviation present.  Cardiovascular: Regular rhythm, normal heart sounds and intact distal pulses.   Tachycardic, 2+ radial and DP/PT pulses bl, negative Homan's bl, No appreciable leg swelling  Pulmonary/Chest: Effort normal and breath sounds normal. No respiratory distress. She has no wheezes. She has no rales. She exhibits no tenderness.  Abdominal: Soft. Bowel sounds are normal. She exhibits no distension. There is tenderness.  Mild suprapubic tenderness, Murphy's sign absent, Rovsing sign absent, no CVA tenderness  Musculoskeletal: Normal range of motion. She exhibits no edema or tenderness.  No midline spine TTP, no paraspinal muscle tenderness, no deformity, crepitus, or step-off noted. 5/5 strength of BLE and BLE major muscle groups. No tenderness, deformity, crepitus, or swelling noted on palpation of the extremities.  Neurological: She is alert. No cranial nerve deficit or sensory deficit. She exhibits normal muscle tone.  Mental Status:  Alert to person, place, but not time, thought content appropriate, able to give a coherent history. Speech fluent without evidence of aphasia. Able to follow 2 step commands without difficulty.  Cranial Nerves:  II:  Peripheral visual fields grossly normal, pupils equal, round, reactive to light III,IV, VI: ptosis not present, extra-ocular motions intact bilaterally  V,VII: smile symmetric, facial light touch sensation equal VIII: hearing grossly normal to voice  X: uvula elevates symmetrically  XI: bilateral shoulder shrug symmetric and strong XII: midline tongue extension without fassiculations Motor:  Normal tone. 5/5 strength of BUE and BLE major muscle groups including strong and equal grip strength and  dorsiflexion/plantar flexion Sensory: light touch normal in all extremities. Cerebellar: normal finger-to-nose with bilateral upper extremities Gait: normal gait and balance. Able to walk on toes and heels with ease.     Skin: No erythema.  Psychiatric: She has a normal mood and affect. Her behavior is normal.  Nursing note and vitals reviewed.    ED Treatments / Results  Labs (all labs ordered are listed, but only abnormal results are displayed) Labs Reviewed  URINALYSIS, ROUTINE W REFLEX MICROSCOPIC - Abnormal; Notable for the following:       Result Value   Hgb urine dipstick LARGE (*)    Leukocytes, UA SMALL (*)    Squamous Epithelial / LPF 0-5 (*)    All other components within normal limits  D-DIMER, QUANTITATIVE (NOT AT [facility]) - Abnormal; Notable for the following:    D-Dimer, Quant 1.50 (*)    All other components within normal limits  URINE CULTURE  CBC WITH DIFFERENTIAL/PLATELET  BASIC METABOLIC PANEL  I-STAT BETA HCG BLOOD, ED ([facility], [facility], [facility] ONLY)    EKG  EKG Interpretation  Date/Time:  Tuesday October 29 2016 06:16:08 EDT Ventricular Rate:  77 PR Interval:  QRS Duration: 83 QT Interval:  367 QTC Calculation: 416 R Axis:   58 Text Interpretation:  Sinus rhythm Probable left atrial enlargement Otherwise within normal limits Confirmed by Gilda Crease [phone]) on 10/29/2016 6:26:11 AM Also confirmed by Gilda Crease [phone]), editor Elita Quick ([id number])  on 10/29/2016 6:56:22 AM       Radiology Ct Angio Chest Pe W And/or Wo Contrast  Result Date: 10/29/2016 CLINICAL DATA:  Mid chest pain, shortness of breath x2 days EXAM: CT ANGIOGRAPHY CHEST WITH CONTRAST TECHNIQUE: Multidetector CT imaging of the chest was performed using the standard protocol during bolus administration of intravenous contrast. Multiplanar CT image reconstructions and MIPs were obtained to evaluate the vascular anatomy. CONTRAST:  80 mL Isovue 370 IV COMPARISON:   None. FINDINGS: Cardiovascular: Satisfactory opacification of the bilateral pulmonary arteries to the segmental level. No evidence of pulmonary embolism. No evidence of thoracic aortic aneurysm. The heart is normal in size.  No pericardial effusion. Mediastinum/Nodes: No suspicious mediastinal lymphadenopathy. Visualized thyroid is unremarkable. Lungs/Pleura: Evaluation of the lung parenchyma is mildly constrained by respiratory motion. Within that constraint, there are no suspicious pulmonary nodules. Mild ground-glass opacity, likely reflecting atelectasis/ mosaic attenuation related to poor inspiration. No pleural effusion or pneumothorax. Upper Abdomen: Visualized upper abdomen is unremarkable. Musculoskeletal: Visualized osseous structures are within normal limits. Review of the MIP images confirms the above findings. IMPRESSION: No evidence of pulmonary embolism. Negative CT chest. Electronically Signed   By: Charline Bills M.D.   On: 10/29/2016 10:45    Procedures Procedures (including critical care time)  Medications Ordered in ED Medications  naproxen (NAPROSYN) tablet 500 mg (500 mg Oral Given 10/29/16 0928)  iopamidol (ISOVUE-370) 76 % injection (100 mLs  Contrast Given 10/29/16 1030)  fluconazole (DIFLUCAN) tablet 150 mg (150 mg Oral Given 10/29/16 1109)     Initial Impression / Assessment and Plan / ED Course  I have reviewed the triage vital signs and the nursing notes.  Pertinent labs & imaging results that were available during my care of the patient were reviewed by me and considered in my medical decision making (see chart for details).     Patient with 2 episodes of syncope. Afebrile, vital signs are stable. No focal neurological deficits on examination. Denies chest pain or shortness of breath, however she was intermittently tachycardic while in the ED and uses estrogen patch as birth control. D-dimer was positive, so she underwent CTA of the chest to rule out PE. Imaging  was negative forPE or other cardiopulmonary abnormality.I have low suspicion of DVT. EKG without evidence of ST segment abnormality or arrhythmia. Remainder of lab work is unremarkable. She is not anemic and no electrolyte abnormalities. She is extremely low risk for cardiac etiology of symptoms, I doubt ACS or MI, and she has no chest pain. I doubt CVA, ICH, SAH, or TIA as she is low risk for the above and there are no red flag signs. UA is not concerning for UTI, however she does have dysuria. Discussed with patient, she elects to hold on antibiotics and wishes to wait for urine culture results. She is requesting Diflucan for yeast infection as she recently had antibiotics for PID and states that she is prone to yeast infections. I think this is reasonable.  With her history of focal seizures and with her not taking Depakote for greater than 6 months, I recommend follow-up with neurology for reevaluation. Also discussed follow-up with primary care physician for possible Holter monitoring. She also  requested information for follow-up with OB/GYN which I provided.discussed indications for return to the ED.Pt verbalized understanding of and agreement with plan and is safe for discharge home at this time.   Final Clinical Impressions(s) / ED Diagnoses   Final diagnoses:  Syncope and collapse    New Prescriptions New Prescriptions   No medications on file     Jeanie Sewer, PA-C 10/29/16 1112    Gilda Crease, MD 10/30/16 [phone]

## 2016-10-30 LAB — URINE CULTURE

## 2016-12-03 ENCOUNTER — Other Ambulatory Visit: Payer: Self-pay

## 2016-12-03 ENCOUNTER — Encounter: Payer: Medicaid Other | Admitting: Medical

## 2016-12-03 DIAGNOSIS — Z7251 High risk heterosexual behavior: Secondary | ICD-10-CM | POA: Insufficient documentation

## 2016-12-03 DIAGNOSIS — J45909 Unspecified asthma, uncomplicated: Secondary | ICD-10-CM | POA: Insufficient documentation

## 2016-12-03 DIAGNOSIS — Z91040 Latex allergy status: Secondary | ICD-10-CM | POA: Diagnosis not present

## 2016-12-03 DIAGNOSIS — F17200 Nicotine dependence, unspecified, uncomplicated: Secondary | ICD-10-CM | POA: Insufficient documentation

## 2016-12-03 DIAGNOSIS — Z113 Encounter for screening for infections with a predominantly sexual mode of transmission: Secondary | ICD-10-CM | POA: Diagnosis present

## 2016-12-03 DIAGNOSIS — N898 Other specified noninflammatory disorders of vagina: Secondary | ICD-10-CM | POA: Diagnosis not present

## 2016-12-03 DIAGNOSIS — Z79899 Other long term (current) drug therapy: Secondary | ICD-10-CM | POA: Insufficient documentation

## 2016-12-04 ENCOUNTER — Encounter (HOSPITAL_COMMUNITY): Payer: Self-pay | Admitting: Emergency Medicine

## 2016-12-04 ENCOUNTER — Emergency Department (HOSPITAL_COMMUNITY)
Admission: EM | Admit: 2016-12-04 | Discharge: 2016-12-04 | Disposition: A | Discharge status: Home / Self Care | Payer: Medicaid Other | Attending: Emergency Medicine | Admitting: Emergency Medicine

## 2016-12-04 DIAGNOSIS — Z7251 High risk heterosexual behavior: Secondary | ICD-10-CM

## 2016-12-04 HISTORY — DX: Polycystic ovarian syndrome: E28.2

## 2016-12-04 LAB — WET PREP, GENITAL
SPERM: NONE SEEN
TRICH WET PREP: NONE SEEN
YEAST WET PREP: NONE SEEN

## 2016-12-04 LAB — URINALYSIS, ROUTINE W REFLEX MICROSCOPIC
Bacteria, UA: NONE SEEN
Bilirubin Urine: NEGATIVE
GLUCOSE, UA: NEGATIVE mg/dL
Ketones, ur: NEGATIVE mg/dL
Leukocytes, UA: NEGATIVE
Nitrite: NEGATIVE
PH: 7 (ref 5.0–8.0)
Protein, ur: NEGATIVE mg/dL
SPECIFIC GRAVITY, URINE: 1.009 (ref 1.005–1.030)

## 2016-12-04 LAB — GC/CHLAMYDIA PROBE AMP (~~LOC~~) NOT AT ARMC
Chlamydia: NEGATIVE
Neisseria Gonorrhea: NEGATIVE

## 2016-12-04 LAB — POC URINE PREG, ED: Preg Test, Ur: NEGATIVE

## 2016-12-04 NOTE — ED Provider Notes (Signed)
[facility] EMERGENCY DEPARTMENT Provider Note   [name]: [id number] Arrival date & time: 12/03/16  2355     History   Chief Complaint Chief Complaint  Patient presents with  . Gynecologic Exam    HPI Colleen Stone is a 21 y.o. female.  [age] year old female with a history of asthma and polycystic ovarian syndrome presents to the emergency department after missing a routine appointment with women's outpatient clinic.  She states that she made the appointment to be tested for STDs.  She denies having any current symptoms, but is distrusting of her boyfriend who has cheated on her in the past.  She does have a history of STDs which she was treated for with antibiotics.  She is unsure if her partner was treated as well and she has resumed sexual intercourse without use of protection.  No fevers, abdominal pain or urinary symptoms.   The history is provided by the patient. No language interpreter was used.  Gynecologic Exam     Past Medical History:  Diagnosis Date  . Asthma   . PCOS (polycystic ovarian syndrome)     There are no active problems to display for this patient.   Past Surgical History:  Procedure Laterality Date  . TONSILLECTOMY      OB History    No data available       Home Medications    Prior to Admission medications   Medication Sig Start Date End Date Taking? Authorizing Provider  norelgestromin-ethinyl estradiol Burr Medico(XULANE) 150-35 MCG/24HR transdermal patch Place 1 patch onto the skin once a week. Tuesdays    [provider]    Family History History reviewed. No pertinent family history.  Social History Social History   Tobacco Use  . Smoking status: Current Some Day Smoker  . Smokeless tobacco: Never Used  Substance Use Topics  . Alcohol use: No  . Drug use: Yes    Types: Marijuana     Allergies   Latex and Red dye   Review of Systems Review of Systems Ten systems reviewed and are negative for acute  change, except as noted in the HPI.    Physical Exam Updated Vital Signs BP 126/85 (BP Location: Right Arm)   Pulse 82   Temp 98.1 F (36.7 C) (Oral)   Resp 16   Ht 5\' 4"  (1.626 m)   Wt 119.3 kg (263 lb)   SpO2 100%   BMI 45.14 kg/m   Physical Exam  Constitutional: She is oriented to person, place, and time. She appears well-developed and well-nourished. No distress.  Nontoxic appearing and in NAD  HENT:  Head: Normocephalic and atraumatic.  Eyes: Conjunctivae and EOM are normal. No scleral icterus.  Neck: Normal range of motion.  Pulmonary/Chest: Effort normal. No respiratory distress.  Respirations even and unlabored  Genitourinary: There is no rash, tenderness, lesion or injury on the right labia. There is no rash, tenderness, lesion or injury on the left labia. Cervix exhibits no motion tenderness and no friability. No foreign body in the vagina. Vaginal discharge found.  Genitourinary Comments: Scant brown discharge. No CMT or friability.  Musculoskeletal: Normal range of motion.  Neurological: She is alert and oriented to person, place, and time. She exhibits normal muscle tone. Coordination normal.  Skin: Skin is warm and dry. No rash noted. She is not diaphoretic. No erythema. No pallor.  Psychiatric: She has a normal mood and affect. Her behavior is normal.  Nursing note and vitals reviewed.  ED Treatments / Results  Labs (all labs ordered are listed, but only abnormal results are displayed) Labs Reviewed  WET PREP, GENITAL - Abnormal; Notable for the following components:      Result Value   Clue Cells Wet Prep HPF POC PRESENT (*)    WBC, Wet Prep HPF POC MANY (*)    All other components within normal limits  URINALYSIS, ROUTINE W REFLEX MICROSCOPIC - Abnormal; Notable for the following components:   Color, Urine STRAW (*)    Hgb urine dipstick MODERATE (*)    Squamous Epithelial / LPF 0-5 (*)    All other components within normal limits  POC URINE PREG,  ED  GC/CHLAMYDIA PROBE AMP ([place]) NOT AT [facility]    EKG  EKG Interpretation None       Radiology No results found.  Procedures Procedures (including critical care time)  Medications Ordered in ED Medications - No data to display   Initial Impression / Assessment and Plan / ED Course  I have reviewed the triage vital signs and the nursing notes.  Pertinent labs & imaging results that were available during my care of the patient were reviewed by me and considered in my medical decision making (see chart for details).     Patient to be discharged with instructions to follow up with the Health Department. Discussed importance of using protection when sexually active. Patient understands that they have GC/Chlamydia cultures pending and that they will need to inform all sexual partners if results return positive. No current indication for prophylactic treatment. Return precautions discussed and provided. Patient discharged in stable condition with no unaddressed concerns.   Final Clinical Impressions(s) / ED Diagnoses   Final diagnoses:  History of unprotected sex    ED Discharge Orders    None       Antony MaduraHumes, Jerome Otter, PA-C 12/04/16 0154    Palumbo, April, MD 12/04/16 [phone]

## 2016-12-04 NOTE — Discharge Instructions (Signed)
Follow-up with the health department in 2 days regarding the results of your STD test.  You were negative for trichomonas at this visit.  Your urinalysis did not suggest evidence of a UTI.  If you test positive for an STD, notify all partners of their need to be tested and treated for STDs as well.  Should you require treatment with antibiotics, do not resume sexual intercourse until 1 week following completion of these antibiotics.  Follow-up with an OB/GYN as needed for persistent symptoms.
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
## 2017-09-27 ENCOUNTER — Emergency Department (HOSPITAL_COMMUNITY)
Admission: EM | Admit: 2017-09-27 | Discharge: 2017-09-27 | Disposition: A | Discharge status: Home / Self Care | Payer: Medicaid Other | Attending: Emergency Medicine | Admitting: Emergency Medicine

## 2017-09-27 ENCOUNTER — Encounter (HOSPITAL_COMMUNITY): Payer: Self-pay | Admitting: Obstetrics and Gynecology

## 2017-09-27 ENCOUNTER — Other Ambulatory Visit: Payer: Self-pay

## 2017-09-27 DIAGNOSIS — Z3201 Encounter for pregnancy test, result positive: Secondary | ICD-10-CM

## 2017-09-27 DIAGNOSIS — N898 Other specified noninflammatory disorders of vagina: Secondary | ICD-10-CM | POA: Diagnosis not present

## 2017-09-27 DIAGNOSIS — R109 Unspecified abdominal pain: Secondary | ICD-10-CM | POA: Diagnosis not present

## 2017-09-27 DIAGNOSIS — J45909 Unspecified asthma, uncomplicated: Secondary | ICD-10-CM | POA: Insufficient documentation

## 2017-09-27 DIAGNOSIS — F17200 Nicotine dependence, unspecified, uncomplicated: Secondary | ICD-10-CM | POA: Diagnosis not present

## 2017-09-27 LAB — I-STAT BETA HCG BLOOD, ED (MC, WL, AP ONLY): I-stat hCG, quantitative: 28.6 m[IU]/mL — ABNORMAL HIGH (ref <5)

## 2017-09-27 LAB — POC URINE PREG, ED: Preg Test, Ur: NEGATIVE

## 2017-09-27 LAB — HCG, QUANTITATIVE, PREGNANCY: hCG, Beta Chain, Quant, S: 28 m[IU]/mL — ABNORMAL HIGH (ref <5)

## 2017-09-27 NOTE — ED Provider Notes (Signed)
[facility]-EMERGENCY DEPT Provider Note   [name]: [id number] Arrival date & time: 09/27/17  1844     History   Chief Complaint Chief Complaint  Patient presents with  . Possible Pregnancy    HPI Colleen Stone is a 22 y.o. G4 P0040, with hx of PCOS who presents to the ED requesting a pregnancy test. Patient is currently sexually active with 3 partners. one partner is female and 2 are female. patient has had unprotected sex. Patient reports using no birth control and thinks her period was over 2 months ago but it is irregular due to PCOS. Patient reports she has had lower abdominal cramping, vaginal d/c, vaginal itching and irritation. Hx of GC, Chlamydia and trichomonas. Patient states she has taken several HPT that were positive.   HPI  Past Medical History:  Diagnosis Date  . Asthma   . PCOS (polycystic ovarian syndrome)     There are no active problems to display for this patient.   Past Surgical History:  Procedure Laterality Date  . TONSILLECTOMY       OB History   None      Home Medications    Prior to Admission medications   Medication Sig Start Date End Date Taking? Authorizing Provider  norelgestromin-ethinyl estradiol Burr Medico) 150-35 MCG/24HR transdermal patch Place 1 patch onto the skin once a week. Tuesdays    [provider]    Family History No family history on file.  Social History Social History   Tobacco Use  . Smoking status: Current Some Day Smoker  . Smokeless tobacco: Never Used  Substance Use Topics  . Alcohol use: Yes    Comment: Social  . Drug use: Yes    Types: Marijuana    Comment: Daily     Allergies   Latex and Red dye   Review of Systems Review of Systems  Gastrointestinal: Positive for abdominal pain.  Genitourinary: Positive for pelvic pain and vaginal discharge. Negative for dysuria, frequency and vaginal bleeding. Vaginal pain: irritation and itching.  All other systems reviewed and  are negative.    Physical Exam Updated Vital Signs BP 133/79 (BP Location: Right Arm)   Pulse 83   Temp 99 F (37.2 C)   Resp 16   LMP 06/27/2017 (Approximate)   SpO2 100%   Physical Exam  Constitutional: She appears well-developed and well-nourished. No distress.  HENT:  Head: Normocephalic.  Eyes: EOM are normal.  Neck: Neck supple.  Cardiovascular: Normal rate.  Pulmonary/Chest: Effort normal.  Abdominal: Soft. There is no tenderness.  Genitourinary:  Genitourinary Comments: Cultures collected for GC and Chlamydia  Musculoskeletal: Normal range of motion.  Neurological: She is alert.  Skin: Skin is warm and dry.  Psychiatric: She has a normal mood and affect.  Nursing note and vitals reviewed.    ED Treatments / Results  Labs (all labs ordered are listed, but only abnormal results are displayed) Labs Reviewed  HCG, QUANTITATIVE, PREGNANCY - Abnormal; Notable for the following components:      Result Value   hCG, Beta Chain, Quant, S 28 (*)    All other components within normal limits  I-STAT BETA HCG BLOOD, ED ([facility], [facility], [facility] ONLY) - Abnormal; Notable for the following components:   I-stat hCG, quantitative 28.6 (*)    All other components within normal limits  RPR  HIV ANTIBODY (ROUTINE TESTING W REFLEX)  POC URINE PREG, ED  GC/CHLAMYDIA PROBE AMP ([place]) NOT AT [facility]   Radiology No  results found.  Procedures Procedures (including critical care time)  Medications Ordered in ED Medications - No data to display   Initial Impression / Assessment and Plan / ED Course  I have reviewed the triage vital signs and the nursing notes. 22 y.o. female here for pregnancy conformation and mild cramping in lower abdomen stable for d/c without vaginal bleeding and no pain on abdominal exam. Spoke with Vonzella NippleJulie Wenzel[facility] and they will see the patient for f/u in the clinic Tuesday 09/30/17 @ 8:30 am. If the patient has problems before then she  [facility]. Ectopic precautions discussed with the patient.   Final Clinical Impressions(s) / ED Diagnoses   Final diagnoses:  Positive pregnancy test  Abdominal cramping    ED Discharge Orders    None       Kerrie Buffaloeese, Hope ManM, [place]NP 09/27/17 2255    Cathren LaineSteinl, Kevin, MD 09/28/17 [phone]

## 2017-09-27 NOTE — ED Triage Notes (Signed)
Pt reports she took a pregnancy test as a joke and it came back positive, so she took 4 more. Pt reports they were all positive. Pt reports she has PCOS, so she was shocked that it came back positive. Pt is very concerned because she didn't think it was possible for her to be pregnant. Pt reports she was drinking and smoking marijuana yesterday and she smokes every day.

## 2017-09-27 NOTE — Discharge Instructions (Addendum)
Your pregnancy hormone level tonight is slightly elevated. [facility]30 am to repeat the hormone level. With a normal pregnancy the number should double every 2 days. You can ask the results of your STD screening at that visit and they will treat you if anything is positive. If you have problems before Tuesday [facility].

## 2017-09-28 LAB — HIV ANTIBODY (ROUTINE TESTING W REFLEX): HIV Screen 4th Generation wRfx: NONREACTIVE

## 2017-09-28 LAB — RPR: RPR Ser Ql: NONREACTIVE

## 2017-09-29 LAB — GC/CHLAMYDIA PROBE AMP (~~LOC~~) NOT AT ARMC
CHLAMYDIA, DNA PROBE: NEGATIVE
Neisseria Gonorrhea: NEGATIVE

## 2017-09-30 ENCOUNTER — Ambulatory Visit (INDEPENDENT_AMBULATORY_CARE_PROVIDER_SITE_OTHER): Payer: Medicaid Other | Admitting: General Practice

## 2017-09-30 DIAGNOSIS — Z3490 Encounter for supervision of normal pregnancy, unspecified, unspecified trimester: Secondary | ICD-10-CM

## 2017-09-30 DIAGNOSIS — O0281 Inappropriate change in quantitative human chorionic gonadotropin (hCG) in early pregnancy: Secondary | ICD-10-CM

## 2017-09-30 LAB — HCG, QUANTITATIVE, PREGNANCY: HCG, BETA CHAIN, QUANT, S: 7 m[IU]/mL — AB (ref <5)

## 2017-09-30 NOTE — Progress Notes (Signed)
I have reviewed this chart and agree with the RN/CMA assessment and management.    Lenice Koper C Lauralei Clouse, MD, FACOG Attending Physician, [facility]  

## 2017-09-30 NOTE — Progress Notes (Signed)
Patient presents to office today for stat bhcg. Patient denies pain or bleeding. Discussed with patient we are monitoring your beta hcg levels today & asked she wait in lobby for results/updated plan of care. Patient verbalized understanding & had no questions at this time.   Reviewed results with Dr Marice Potterove who finds decreasing bhcg levels consistent with SAB. No further follow up is needed.  Informed patient of results & offered follow up appt with a provider to discuss birth control, future pregnancies, annual exam etc. Patient verbalized understanding & will call back at another time. Patient had no questions.

## 2017-10-06 ENCOUNTER — Inpatient Hospital Stay (HOSPITAL_COMMUNITY)
Admission: AD | Admit: 2017-10-06 | Discharge: 2017-10-06 | Disposition: A | Discharge status: Home / Self Care | Payer: Medicaid Other | Source: Ambulatory Visit | Attending: Obstetrics and Gynecology | Admitting: Obstetrics and Gynecology

## 2017-10-06 ENCOUNTER — Other Ambulatory Visit: Payer: Self-pay

## 2017-10-06 ENCOUNTER — Encounter (HOSPITAL_COMMUNITY): Payer: Self-pay | Admitting: *Deleted

## 2017-10-06 DIAGNOSIS — F419 Anxiety disorder, unspecified: Secondary | ICD-10-CM

## 2017-10-06 DIAGNOSIS — O039 Complete or unspecified spontaneous abortion without complication: Secondary | ICD-10-CM | POA: Diagnosis present

## 2017-10-06 DIAGNOSIS — F17210 Nicotine dependence, cigarettes, uncomplicated: Secondary | ICD-10-CM | POA: Insufficient documentation

## 2017-10-06 DIAGNOSIS — Z711 Person with feared health complaint in whom no diagnosis is made: Secondary | ICD-10-CM

## 2017-10-06 HISTORY — DX: Gonococcal infection, unspecified: A54.9

## 2017-10-06 HISTORY — DX: Unspecified convulsions: R56.9

## 2017-10-06 HISTORY — DX: Trichomoniasis, unspecified: A59.9

## 2017-10-06 HISTORY — DX: Chlamydial infection, unspecified: A74.9

## 2017-10-06 LAB — URINALYSIS, ROUTINE W REFLEX MICROSCOPIC
BILIRUBIN URINE: NEGATIVE
Glucose, UA: NEGATIVE mg/dL
HGB URINE DIPSTICK: NEGATIVE
Ketones, ur: NEGATIVE mg/dL
Leukocytes, UA: NEGATIVE
NITRITE: NEGATIVE
PH: 6 (ref 5.0–8.0)
Protein, ur: NEGATIVE mg/dL
SPECIFIC GRAVITY, URINE: 1.016 (ref 1.005–1.030)

## 2017-10-06 LAB — CBC WITH DIFFERENTIAL/PLATELET
Basophils Absolute: 0 10*3/uL (ref 0.0–0.1)
Basophils Relative: 0 %
EOS ABS: 0 10*3/uL (ref 0.0–0.7)
Eosinophils Relative: 1 %
HCT: 37 % (ref 36.0–46.0)
HEMOGLOBIN: 12.3 g/dL (ref 12.0–15.0)
LYMPHS ABS: 2.4 10*3/uL (ref 0.7–4.0)
LYMPHS PCT: 41 %
MCH: 27.2 pg (ref 26.0–34.0)
MCHC: 33.2 g/dL (ref 30.0–36.0)
MCV: 81.9 fL (ref 78.0–100.0)
Monocytes Absolute: 0.3 10*3/uL (ref 0.1–1.0)
Monocytes Relative: 5 %
NEUTROS PCT: 53 %
Neutro Abs: 3.2 10*3/uL (ref 1.7–7.7)
Platelets: 355 10*3/uL (ref 150–400)
RBC: 4.52 MIL/uL (ref 3.87–5.11)
RDW: 14.6 % (ref 11.5–15.5)
WBC: 5.9 10*3/uL (ref 4.0–10.5)

## 2017-10-06 LAB — ABO/RH: ABO/RH(D): A POS

## 2017-10-06 LAB — HCG, QUANTITATIVE, PREGNANCY: hCG, Beta Chain, Quant, S: 1 m[IU]/mL (ref <5)

## 2017-10-06 MED ORDER — CLONAZEPAM 0.5 MG PO TABS
0.5000 mg | ORAL_TABLET | Freq: Once | ORAL | Status: AC
  Administered 2017-10-06: 0.5 mg via ORAL
  Filled 2017-10-06: qty 1

## 2017-10-06 MED ORDER — CLONAZEPAM 0.5 MG PO TABS: 0.5000 mg | ORAL_TABLET | Freq: Two times a day (BID) | ORAL | 0 refills | Status: DC | PRN

## 2017-10-06 MED ORDER — KETOROLAC TROMETHAMINE 60 MG/2ML IM SOLN
60.0000 mg | Freq: Once | INTRAMUSCULAR | Status: AC
  Administered 2017-10-06: 60 mg via INTRAMUSCULAR
  Filled 2017-10-06: qty 2

## 2017-10-06 NOTE — Discharge Instructions (Signed)

## 2017-10-06 NOTE — MAU Note (Signed)
Pt was very tearful and has experienced much recent loss and didn't know it was okay to cry and "break down". She lost her grandmother and her friend and her brother and now this early pregnancy. She is very willing and eager to receive counseling. [facility], heart strings support and a local church to provide a variety of support. She desires to start GYN care in the clinic soon.

## 2017-10-06 NOTE — MAU Provider Note (Signed)
History     [name]: [id number]  Arrival date and time: 10/06/17 1653   First Provider Initiated Contact with Patient 10/06/17 1755      Chief Complaint  Patient presents with  . Vaginal Bleeding  . Abdominal Pain  . Fever   HPI   Ms.Colleen Stone is a 22 y.o. female G1P0010 @ Unknown here in MAU with concerns and questions regarding her recent miscarriage. Says she has a lot of questions and just does not understand the process or what to expect. She has been cramping off and on for about a week. Says the cramping comes and goes. She has tried advil/tylenol which is not helping. Currently she rates her pain 7/10; the pain is located in her lower back and the radiates changes in her abdomen from left to right lower quadrant. She started bleeding 1 week ago. She says she had a lot of bleeding and at times she felt light headed. Currently her bleeding is very light, she only notices it when she wipes.   OB History    Gravida  1   Para      Term      Preterm      AB  1   Living        SAB  1   TAB      Ectopic      Multiple      Live Births              Past Medical History:  Diagnosis Date  . Asthma   . Chlamydia   . Gonorrhea   . PCOS (polycystic ovarian syndrome)   . Seizures (HCC)    Last seizure in 2017  . Trichomonas infection     Past Surgical History:  Procedure Laterality Date  . ADENOIDECTOMY    . ROOT CANAL    . TONSILLECTOMY    . WISDOM TOOTH EXTRACTION      No family history on file.  Social History   Tobacco Use  . Smoking status: Current Some Day Smoker    Types: Cigarettes  . Smokeless tobacco: Never Used  Substance Use Topics  . Alcohol use: Yes    Comment: Social  . Drug use: Yes    Types: Marijuana    Comment: Last marijuana 10/03/17    Allergies:  Allergies  Allergen Reactions  . Latex Hives and Swelling  . Red Dye Nausea And Vomiting and Other (See Comments)    Her body rejects it    Medications Prior to  Admission  Medication Sig Dispense Refill Last Dose  . norelgestromin-ethinyl estradiol Burr Medico(XULANE) 150-35 MCG/24HR transdermal patch Place 1 patch onto the skin once a week. Tuesdays   Past Month at Unknown time   Results for orders placed or performed during the hospital encounter of 10/06/17 (from the past 48 hour(s))  Urinalysis, Routine w reflex microscopic     Status: None   Collection Time: 10/06/17  5:24 PM  Result Value Ref Range   Color, Urine YELLOW YELLOW   APPearance CLEAR CLEAR   Specific Gravity, Urine 1.016 1.005 - 1.030   pH 6.0 5.0 - 8.0   Glucose, UA NEGATIVE NEGATIVE mg/dL   Hgb urine dipstick NEGATIVE NEGATIVE   Bilirubin Urine NEGATIVE NEGATIVE   Ketones, ur NEGATIVE NEGATIVE mg/dL   Protein, ur NEGATIVE NEGATIVE mg/dL   Nitrite NEGATIVE NEGATIVE   Leukocytes, UA NEGATIVE NEGATIVE    Comment: Performed at [facility], [address] [place], [place]  [address]  CBC with Differential     Status: None   Collection Time: 10/06/17  5:57 PM  Result Value Ref Range   WBC 5.9 4.0 - 10.5 K/uL   RBC 4.52 3.87 - 5.11 MIL/uL   Hemoglobin 12.3 12.0 - 15.0 g/dL   HCT 47.8 29.5 - 62.1 %   MCV 81.9 78.0 - 100.0 fL   MCH 27.2 26.0 - 34.0 pg   MCHC 33.2 30.0 - 36.0 g/dL   RDW 30.8 65.7 - 84.6 %   Platelets 355 150 - 400 K/uL   Neutrophils Relative % 53 %   Neutro Abs 3.2 1.7 - 7.7 K/uL   Lymphocytes Relative 41 %   Lymphs Abs 2.4 0.7 - 4.0 K/uL   Monocytes Relative 5 %   Monocytes Absolute 0.3 0.1 - 1.0 K/uL   Eosinophils Relative 1 %   Eosinophils Absolute 0.0 0.0 - 0.7 K/uL   Basophils Relative 0 %   Basophils Absolute 0.0 0.0 - 0.1 K/uL    Comment: Performed at [facility], [address] [place], [place] [address]  hCG, quantitative, pregnancy     Status: None   Collection Time: 10/06/17  5:57 PM  Result Value Ref Range   hCG, Beta Chain, Quant, S 1 <5 mIU/mL    Comment:          GEST. AGE      CONC.  (mIU/mL)   <=1 WEEK        5 - 50     2 WEEKS        50 - 500     3 WEEKS       100 - 10,000     4 WEEKS     1,000 - 30,000     5 WEEKS     3,500 - 115,000   6-8 WEEKS     12,000 - 270,000    12 WEEKS     15,000 - 220,000        FEMALE AND NON-PREGNANT FEMALE:     LESS THAN 5 mIU/mL Performed at [facility], [address], [place], [place] [address]   ABO/Rh     Status: None   Collection Time: 10/06/17  5:57 PM  Result Value Ref Range   ABO/RH(D)      A POS Performed at [facility], [address], [place], [place] [address]     Review of Systems  Constitutional: Negative for fever.  Gastrointestinal: Negative for abdominal pain.  Genitourinary: Negative for vaginal bleeding and vaginal discharge.  Neurological: Positive for dizziness.  Psychiatric/Behavioral: Positive for sleep disturbance. Negative for suicidal ideas. The patient is nervous/anxious.    Physical Exam   Blood pressure 124/84, pulse 71, temperature 98.3 F (36.8 C), temperature source Oral, resp. rate 16, weight 120.9 kg, last menstrual period 08/26/2017, SpO2 100 %.  Physical Exam  Constitutional: She is oriented to person, place, and time. She appears well-developed and well-nourished. No distress.  GI: Soft. She exhibits no distension and no mass. There is no tenderness. There is no rebound and no guarding.  Musculoskeletal: Normal range of motion.  Neurological: She is alert and oriented to person, place, and time.  Skin: Skin is warm. She is not diaphoretic.  Psychiatric: Her behavior is normal. She exhibits a depressed mood. She expresses no homicidal and no suicidal ideation.    MAU Course  Procedures  None  MDM  A positive blood type.  Quant on 9/17: 7 Patient afebrile today,  no leukocytosis, quant <1. Spent a considerable amount of time discussing process of SAB.  0.5 mg of klonopin given in MAU, patient instructed not to drive. Patient informed staff that she has a ride home and is not driving.   Assessment and Plan   A:  1.  SAB (spontaneous abortion)   2. Physically well but worried   3. Anxiousness     P:  Discharge home in stable condition Message sent to to the [facility] to schedule a f/u visit for [facility] and miscarriage. Plan to have patient see Asher Muir at that time as well.  Return to MAU if symptoms worsen Comfort pack provided Rx: Klonopin for sleep # 10 no refill, no driving while taking this medication.    Venia Carbon I, NP 10/08/2017 8:24 AM

## 2017-10-06 NOTE — MAU Note (Signed)
Had a miscarriage last wk.  Still experiencing the same cramping. Her cousin brought her because she passed out about an hour ago. Pain is getting pretty intense. Had a fever last wk, over 100.  Doesn't feel like she is running a fever now, but her body still feels hot. Still bleeding here and there. Did not receive any after care instructions, came in because the pain is continuing.

## 2017-11-07 ENCOUNTER — Telehealth: Payer: Self-pay | Admitting: General Practice

## 2017-11-07 NOTE — Telephone Encounter (Signed)
Patient called & left message requesting a prenatal appt.

## 2017-11-27 ENCOUNTER — Inpatient Hospital Stay (HOSPITAL_COMMUNITY)
Admission: AD | Admit: 2017-11-27 | Discharge: 2017-11-27 | Disposition: A | Discharge status: Home / Self Care | Payer: Medicaid Other | Source: Ambulatory Visit | Attending: Obstetrics & Gynecology | Admitting: Obstetrics & Gynecology

## 2017-11-27 ENCOUNTER — Encounter (HOSPITAL_COMMUNITY): Payer: Self-pay | Admitting: *Deleted

## 2017-11-27 DIAGNOSIS — O210 Mild hyperemesis gravidarum: Secondary | ICD-10-CM | POA: Diagnosis not present

## 2017-11-27 DIAGNOSIS — Z5321 Procedure and treatment not carried out due to patient leaving prior to being seen by health care provider: Secondary | ICD-10-CM | POA: Insufficient documentation

## 2017-11-27 LAB — URINALYSIS, ROUTINE W REFLEX MICROSCOPIC
Bilirubin Urine: NEGATIVE
Glucose, UA: NEGATIVE mg/dL
Hgb urine dipstick: NEGATIVE
KETONES UR: NEGATIVE mg/dL
LEUKOCYTES UA: NEGATIVE
Nitrite: NEGATIVE
PH: 7 (ref 5.0–8.0)
PROTEIN: NEGATIVE mg/dL
Specific Gravity, Urine: 1.014 (ref 1.005–1.030)

## 2017-11-27 LAB — POCT PREGNANCY, URINE: PREG TEST UR: POSITIVE — AB

## 2017-11-27 NOTE — MAU Note (Signed)
Pt reports she has been having N/V  x1 week. Can't keep anything down.  Pt had pregnancy confirmed in [facility]. Pt had miscarriage in September. And is pregnant again. Did not have a period after her miscarriage.

## 2017-11-27 NOTE — MAU Note (Signed)
1st call, pt not in lobby 

## 2017-11-27 NOTE — MAU Note (Signed)
Pt not in lobby x3 

## 2017-11-27 NOTE — MAU Note (Signed)
Pt not in lobby x2 

## 2017-12-12 ENCOUNTER — Other Ambulatory Visit: Payer: Self-pay

## 2017-12-12 ENCOUNTER — Inpatient Hospital Stay (HOSPITAL_COMMUNITY)
Admission: AD | Admit: 2017-12-12 | Discharge: 2017-12-12 | Disposition: A | Discharge status: Home / Self Care | Payer: Medicaid Other | Source: Ambulatory Visit | Attending: Obstetrics and Gynecology | Admitting: Obstetrics and Gynecology

## 2017-12-12 ENCOUNTER — Encounter (HOSPITAL_COMMUNITY): Payer: Self-pay | Admitting: *Deleted

## 2017-12-12 DIAGNOSIS — K5900 Constipation, unspecified: Secondary | ICD-10-CM | POA: Insufficient documentation

## 2017-12-12 DIAGNOSIS — O23591 Infection of other part of genital tract in pregnancy, first trimester: Secondary | ICD-10-CM | POA: Diagnosis not present

## 2017-12-12 DIAGNOSIS — O99331 Smoking (tobacco) complicating pregnancy, first trimester: Secondary | ICD-10-CM | POA: Diagnosis not present

## 2017-12-12 DIAGNOSIS — F17210 Nicotine dependence, cigarettes, uncomplicated: Secondary | ICD-10-CM | POA: Insufficient documentation

## 2017-12-12 DIAGNOSIS — N760 Acute vaginitis: Secondary | ICD-10-CM | POA: Diagnosis not present

## 2017-12-12 DIAGNOSIS — N898 Other specified noninflammatory disorders of vagina: Secondary | ICD-10-CM

## 2017-12-12 DIAGNOSIS — Z3A09 9 weeks gestation of pregnancy: Secondary | ICD-10-CM | POA: Diagnosis not present

## 2017-12-12 DIAGNOSIS — Z79899 Other long term (current) drug therapy: Secondary | ICD-10-CM | POA: Insufficient documentation

## 2017-12-12 DIAGNOSIS — O99611 Diseases of the digestive system complicating pregnancy, first trimester: Secondary | ICD-10-CM | POA: Insufficient documentation

## 2017-12-12 DIAGNOSIS — O26891 Other specified pregnancy related conditions, first trimester: Secondary | ICD-10-CM

## 2017-12-12 DIAGNOSIS — B9689 Other specified bacterial agents as the cause of diseases classified elsewhere: Secondary | ICD-10-CM | POA: Diagnosis not present

## 2017-12-12 DIAGNOSIS — Z91040 Latex allergy status: Secondary | ICD-10-CM | POA: Diagnosis not present

## 2017-12-12 LAB — URINALYSIS, ROUTINE W REFLEX MICROSCOPIC
BILIRUBIN URINE: NEGATIVE
GLUCOSE, UA: NEGATIVE mg/dL
Hgb urine dipstick: NEGATIVE
KETONES UR: NEGATIVE mg/dL
Leukocytes, UA: NEGATIVE
NITRITE: NEGATIVE
PH: 5 (ref 5.0–8.0)
Protein, ur: NEGATIVE mg/dL
Specific Gravity, Urine: 1.019 (ref 1.005–1.030)

## 2017-12-12 LAB — POCT PREGNANCY, URINE: Preg Test, Ur: POSITIVE — AB

## 2017-12-12 LAB — WET PREP, GENITAL
SPERM: NONE SEEN
Trich, Wet Prep: NONE SEEN
Yeast Wet Prep HPF POC: NONE SEEN

## 2017-12-12 MED ORDER — METRONIDAZOLE 500 MG PO TABS: 500.0000 mg | ORAL_TABLET | Freq: Two times a day (BID) | ORAL | 0 refills | Status: DC

## 2017-12-12 MED ORDER — POLYETHYLENE GLYCOL 3350 17 GM/SCOOP PO POWD: 17.0000 g | Freq: Every day | ORAL | 2 refills | Status: DC | PRN

## 2017-12-12 MED ORDER — PROMETHAZINE HCL 25 MG PO TABS: 25.0000 mg | ORAL_TABLET | Freq: Four times a day (QID) | ORAL | 3 refills | Status: DC | PRN

## 2017-12-12 NOTE — MAU Note (Addendum)
Pt reports to MAU c/o a green vaginal discharge with no odor for the last few days. Pt reports she is [redacted]weeks pregnant. LMP was August 13th pt had a miscarriage after this LMP and had not had another period since then pt states she got care in [place] and they told her she was 6weeks so she now would be 9weeks.. No vaginal bleeding. Pt reports cramping and constipation.   Pt also concerned about patches on skin

## 2017-12-12 NOTE — Discharge Instructions (Signed)
Bacterial Vaginosis Bacterial vaginosis is a vaginal infection that occurs when the normal balance of bacteria in the vagina is disrupted. It results from an overgrowth of certain bacteria. This is the most common vaginal infection among women ages 7-44. Because bacterial vaginosis increases your risk for STIs (sexually transmitted infections), getting treated can help reduce your risk for chlamydia, gonorrhea, herpes, and HIV (human immunodeficiency virus). Treatment is also important for preventing complications in pregnant women, because this condition can cause an early (premature) delivery. What are the causes? This condition is caused by an increase in harmful bacteria that are normally present in small amounts in the vagina. However, the reason that the condition develops is not fully understood. What increases the risk? The following factors may make you more likely to develop this condition:  Having a new sexual partner or multiple sexual partners.  Having unprotected sex.  Douching.  Having an intrauterine device (IUD).  Smoking.  Drug and alcohol abuse.  Taking certain antibiotic medicines.  Being pregnant.  You cannot get bacterial vaginosis from toilet seats, bedding, swimming pools, or contact with objects around you. What are the signs or symptoms? Symptoms of this condition include:  Grey or white vaginal discharge. The discharge can also be watery or foamy.  A fish-like odor with discharge, especially after sexual intercourse or during menstruation.  Itching in and around the vagina.  Burning or pain with urination.  Some women with bacterial vaginosis have no signs or symptoms. How is this diagnosed? This condition is diagnosed based on:  Your medical history.  A physical exam of the vagina.  Testing a sample of vaginal fluid under a microscope to look for a large amount of bad bacteria or abnormal cells. Your health care provider may use a cotton swab  or a small wooden spatula to collect the sample.  How is this treated? This condition is treated with antibiotics. These may be given as a pill, a vaginal cream, or a medicine that is put into the vagina (suppository). If the condition comes back after treatment, a second round of antibiotics may be needed. Follow these instructions at home: Medicines  Take over-the-counter and prescription medicines only as told by your health care provider.  Take or use your antibiotic as told by your health care provider. Do not stop taking or using the antibiotic even if you start to feel better. General instructions  If you have a female sexual partner, tell her that you have a vaginal infection. She should see her health care provider and be treated if she has symptoms. If you have a female sexual partner, he does not need treatment.  During treatment: ? Avoid sexual activity until you finish treatment. ? Do not douche. ? Avoid alcohol as directed by your health care provider. ? Avoid breastfeeding as directed by your health care provider.  Drink enough water and fluids to keep your urine clear or pale yellow.  Keep the area around your vagina and rectum clean. ? Wash the area daily with warm water. ? Wipe yourself from front to back after using the toilet.  Keep all follow-up visits as told by your health care provider. This is important. How is this prevented?  Do not douche.  Wash the outside of your vagina with warm water only.  Use protection when having sex. This includes latex condoms and dental dams.  Limit how many sexual partners you have. To help prevent bacterial vaginosis, it is best to have sex with just  one partner (monogamous). °· Make sure you and your sexual partner are tested for STIs. °· Wear cotton or cotton-lined underwear. °· Avoid wearing tight pants and pantyhose, especially during summer. °· Limit the amount of alcohol that you drink. °· Do not use any products that  contain nicotine or tobacco, such as cigarettes and e-cigarettes. If you need help quitting, ask your health care provider. °· Do not use illegal drugs. °Where to find more information: °· Centers for Disease Control and Prevention: www.cdc.gov/std °· American Sexual Health Association (ASHA): www.ashastd.org °· U.S. Department of Health and Human Services, Office on Women's Health: www.womenshealth.gov/ or https://www.womenshealth.gov/a-z-topics/bacterial-vaginosis °Contact a health care provider if: °· Your symptoms do not improve, even after treatment. °· You have more discharge or pain when urinating. °· You have a fever. °· You have pain in your abdomen. °· You have pain during sex. °· You have vaginal bleeding between periods. °Summary °· Bacterial vaginosis is a vaginal infection that occurs when the normal balance of bacteria in the vagina is disrupted. °· Because bacterial vaginosis increases your risk for STIs (sexually transmitted infections), getting treated can help reduce your risk for chlamydia, gonorrhea, herpes, and HIV (human immunodeficiency virus). Treatment is also important for preventing complications in pregnant women, because the condition can cause an early (premature) delivery. °· This condition is treated with antibiotic medicines. These may be given as a pill, a vaginal cream, or a medicine that is put into the vagina (suppository). °This information is not intended to replace advice given to you by your health care provider. Make sure you discuss any questions you have with your health care provider. °Document Released: 12/31/2004 Document Revised: 05/06/2016 Document Reviewed: 09/16/2015 °Elsevier Interactive Patient Education © 2018 [employer] °Constipation, Adult °Constipation is when a person has fewer bowel movements in a week than normal, has difficulty having a bowel movement, or has stools that are dry, hard, or larger than normal. Constipation may be caused by an underlying  condition. It may become worse with age if a person takes certain medicines and does not take in enough fluids. °Follow these instructions at home: °Eating and drinking ° °· Eat foods that have a lot of fiber, such as fresh fruits and vegetables, whole grains, and beans. °· Limit foods that are high in fat, low in fiber, or overly processed, such as french fries, hamburgers, cookies, candies, and soda. °· Drink enough fluid to keep your urine clear or pale yellow. °General instructions °· Exercise regularly or as told by your health care provider. °· Go to the restroom when you have the urge to go. Do not hold it in. °· Take over-the-counter and prescription medicines only as told by your health care provider. These include any fiber supplements. °· Practice pelvic floor retraining exercises, such as deep breathing while relaxing the lower abdomen and pelvic floor relaxation during bowel movements. °· Watch your condition for any changes. °· Keep all follow-up visits as told by your health care provider. This is important. °Contact a health care provider if: °· You have pain that gets worse. °· You have a fever. °· You do not have a bowel movement after 4 days. °· You vomit. °· You are not hungry. °· You lose weight. °· You are bleeding from the anus. °· You have thin, pencil-like stools. °Get help right away if: °· You have a fever and your symptoms suddenly get worse. °· You leak stool or have blood in your stool. °· Your abdomen is   bloated. °· You have severe pain in your abdomen. °· You feel dizzy or you faint. °This information is not intended to replace advice given to you by your health care provider. Make sure you discuss any questions you have with your health care provider. °Document Released: 09/29/2003 Document Revised: 07/21/2015 Document Reviewed: 06/21/2015 °Elsevier Interactive Patient Education © 2018 [employer] ° °

## 2017-12-12 NOTE — MAU Provider Note (Addendum)
Chief Complaint: Vaginal Discharge and Rash   First Provider Initiated Contact with Patient 12/12/17 0721     SUBJECTIVE HPI: Colleen Stone is a 22 y.o. G2P0010 at [redacted]w[redacted]d who presents to Maternity Admissions reporting green vaginal discharge.  Worried that that means there is something wrong with her baby.  Had ultrasound on 11/20/2017 showing 6-week 4-day live IUP.  Is having slight cramping.  Denies bleeding.  Associated signs and symptoms: Negative for fever, chills, vaginal bleeding, urinary complaints, vaginal itching or irritation.  Positive for constipation.  Past Medical History:  Diagnosis Date  . Asthma   . Chlamydia   . Gonorrhea   . PCOS (polycystic ovarian syndrome)   . Seizures (HCC)    Last seizure in 2017  . Trichomonas infection    OB History  Gravida Para Term Preterm AB Living  2       1    SAB TAB Ectopic Multiple Live Births  1            # Outcome Date GA Lbr Len/2nd Weight Sex Delivery Anes PTL Lv  2 Current           1 SAB            Past Surgical History:  Procedure Laterality Date  . ADENOIDECTOMY    . ROOT CANAL    . TONSILLECTOMY    . WISDOM TOOTH EXTRACTION     Social History   Socioeconomic History  . Marital status: Single    Spouse name: Not on file  . Number of children: Not on file  . Years of education: Not on file  . Highest education level: Not on file  Occupational History  . Not on file  Social Needs  . Financial resource strain: Not on file  . Food insecurity:    Worry: Not on file    Inability: Not on file  . Transportation needs:    Medical: Not on file    Non-medical: Not on file  Tobacco Use  . Smoking status: Current Some Day Smoker    Types: Cigarettes  . Smokeless tobacco: Never Used  Substance and Sexual Activity  . Alcohol use: Yes    Comment: Social  . Drug use: Yes    Types: Marijuana    Comment: Last marijuana 10/03/17  . Sexual activity: Yes    Birth control/protection: none  Lifestyle  . Physical  activity:    Days per week: Not on file    Minutes per session: Not on file  . Stress: Not on file  Relationships  . Social connections:    Talks on phone: Not on file    Gets together: Not on file    Attends religious service: Not on file    Active member of club or organization: Not on file    Attends meetings of clubs or organizations: Not on file    Relationship status: Not on file  . Intimate partner violence:    Fear of current or ex partner: Not on file    Emotionally abused: Not on file    Physically abused: Not on file    Forced sexual activity: Not on file  Other Topics Concern  . Not on file  Social History Narrative  . Not on file   No family history on file. No current facility-administered medications on file prior to encounter.    Current Outpatient Medications on File Prior to Encounter  Medication Sig Dispense Refill  . Prenatal Vit-Fe Fumarate-FA (  PRENATAL MULTIVITAMIN) TABS tablet Take 1 tablet by mouth daily at 12 noon.    . clonazePAM (KLONOPIN) 0.5 MG tablet Take 1 tablet (0.5 mg total) by mouth 2 (two) times daily as needed for anxiety. 10 tablet 0  . norelgestromin-ethinyl estradiol Burr Medico(XULANE) 150-35 MCG/24HR transdermal patch Place 1 patch onto the skin once a week. Tuesdays     Allergies  Allergen Reactions  . Latex Hives and Swelling  . Red Dye Nausea And Vomiting and Other (See Comments)    Her body rejects it    I have reviewed patient's Past Medical Hx, Surgical Hx, Family Hx, Social Hx, medications and allergies.   Review of Systems  Constitutional: Negative for chills and fever.  Gastrointestinal: Positive for abdominal pain and constipation. Negative for diarrhea, nausea and vomiting.  Genitourinary: Positive for vaginal discharge. Negative for dysuria, vaginal bleeding and vaginal pain.    OBJECTIVE Patient Vitals for the past 24 hrs:  BP Temp Temp src Pulse Resp Height Weight  12/12/17 0604 123/74 98.4 F (36.9 C) Oral 92 17 5' 4.5"  (1.638 m) 120.8 kg   Constitutional: Well-developed, well-nourished female in no acute distress.  Very anxious. Cardiovascular: normal rate Respiratory: normal rate and effort.  GI: Abd soft, non-tender.  MS: Extremities nontender, no edema, normal ROM Neurologic: Alert and oriented x 4.  GU: Neg CVAT.  SPECULUM EXAM: NEFG, slight excoriation of vulva (patient states she has been previously prescribed with eczema of her vulva) thin, white, mildly malodorous discharge and small amount of mucus, no blood noted, cervix clean.  No mucopurulent discharge.  BIMANUAL: cervix closed; uterus-week size, no adnexal tenderness or masses.  No CMT.  Fetal heart rate 152 by informal bedside ultrasound.  Active fetus.  LAB RESULTS Results for orders placed or performed during the hospital encounter of 12/12/17 (from the past 24 hour(s))  Urinalysis, Routine w reflex microscopic     Status: Abnormal   Collection Time: 12/12/17  6:18 AM  Result Value Ref Range   Color, Urine YELLOW YELLOW   APPearance HAZY (A) CLEAR   Specific Gravity, Urine 1.019 1.005 - 1.030   pH 5.0 5.0 - 8.0   Glucose, UA NEGATIVE NEGATIVE mg/dL   Hgb urine dipstick NEGATIVE NEGATIVE   Bilirubin Urine NEGATIVE NEGATIVE   Ketones, ur NEGATIVE NEGATIVE mg/dL   Protein, ur NEGATIVE NEGATIVE mg/dL   Nitrite NEGATIVE NEGATIVE   Leukocytes, UA NEGATIVE NEGATIVE  Pregnancy, urine POC     Status: Abnormal   Collection Time: 12/12/17  6:21 AM  Result Value Ref Range   Preg Test, Ur POSITIVE (A) NEGATIVE  Wet prep, genital     Status: Abnormal   Collection Time: 12/12/17  6:42 AM  Result Value Ref Range   Yeast Wet Prep HPF POC NONE SEEN NONE SEEN   Trich, Wet Prep NONE SEEN NONE SEEN   Clue Cells Wet Prep HPF POC PRESENT (A) NONE SEEN   WBC, Wet Prep HPF POC MODERATE (A) NONE SEEN   Sperm NONE SEEN     IMAGING No results found.  MAU COURSE Orders Placed This Encounter  Procedures  . Wet prep, genital  . Urinalysis,  Routine w reflex microscopic  . Pregnancy, urine POC  . Discharge patient   Meds ordered this encounter  Medications  . metroNIDAZOLE (FLAGYL) 500 MG tablet    Sig: Take 1 tablet (500 mg total) by mouth 2 (two) times daily.    Dispense:  14 tablet    Refill:  0    Order Specific Question:   Supervising Provider    Answer:   Conan Bowens [[id number]]  . polyethylene glycol powder (GLYCOLAX/MIRALAX) powder    Sig: Take 17 g by mouth daily as needed for moderate constipation.    Dispense:  500 g    Refill:  2    Order Specific Question:   Supervising Provider    Answer:   Conan Bowens [[id number]]  . promethazine (PHENERGAN) 25 MG tablet    Sig: Take 1 tablet (25 mg total) by mouth every 6 (six) hours as needed for nausea or vomiting.    Dispense:  30 tablet    Refill:  3    Order Specific Question:   Supervising Provider    Answer:   Conan Bowens [[id number]]    MDM -Vaginal discharge in pregnancy.  Wet prep and exam consistent with bacterial vaginosis.  Patient was initially concerned because she did not feel like she had bacterial vaginosis and doubted the diagnosis because the discharge had looked green.  There was no green discharge on exam.  Patient reassured by seeing baby's heart rate on ultrasound.  Rx Flagyl. -Abdominal discomfort likely secondary to constipation.  Increase fluids and fiber.  MiraLAX as needed.  ASSESSMENT 1. BV (bacterial vaginosis)   2. Vaginal discharge during pregnancy in first trimester   3. Constipation during pregnancy in first trimester     PLAN Discharge home in stable condition. First trimester precautions Follow-up Information    [facility] Follow up on 01/13/2018.   Specialty:  Obstetrics and Gynecology Why:  For new OB appointment as scheduled or sooner as needed Contact information: [address] [place] [address] [phone]       [facility] Follow up.   Why:  For  pregnancy emergencies Contact information: [address] [id number] mc [place] [address] [phone]         Allergies as of 12/12/2017      Reactions   Latex Hives, Swelling   Red Dye Nausea And Vomiting, Other (See Comments)   Her body rejects it      Medication List    STOP taking these medications   clonazePAM 0.5 MG tablet Commonly known as:  Levy Pupa 150-35 MCG/24HR transdermal patch Generic drug:  norelgestromin-ethinyl estradiol     TAKE these medications   metroNIDAZOLE 500 MG tablet Commonly known as:  FLAGYL Take 1 tablet (500 mg total) by mouth 2 (two) times daily.   polyethylene glycol powder powder Commonly known as:  GLYCOLAX/MIRALAX Take 17 g by mouth daily as needed for moderate constipation.   prenatal multivitamin Tabs tablet Take 1 tablet by mouth daily at 12 noon.   promethazine 25 MG tablet Commonly known as:  PHENERGAN Take 1 tablet (25 mg total) by mouth every 6 (six) hours as needed for nausea or vomiting.        Katrinka Blazing, [place], CNM 12/12/2017  8:01 AM

## 2017-12-15 LAB — GC/CHLAMYDIA PROBE AMP (~~LOC~~) NOT AT ARMC
Chlamydia: NEGATIVE
Neisseria Gonorrhea: NEGATIVE

## 2017-12-27 ENCOUNTER — Other Ambulatory Visit: Payer: Self-pay | Admitting: Student

## 2017-12-27 ENCOUNTER — Telehealth: Payer: Self-pay | Admitting: Student

## 2017-12-27 DIAGNOSIS — K5900 Constipation, unspecified: Secondary | ICD-10-CM

## 2017-12-27 DIAGNOSIS — N898 Other specified noninflammatory disorders of vagina: Secondary | ICD-10-CM

## 2017-12-27 DIAGNOSIS — N760 Acute vaginitis: Secondary | ICD-10-CM

## 2017-12-27 DIAGNOSIS — B9689 Other specified bacterial agents as the cause of diseases classified elsewhere: Secondary | ICD-10-CM

## 2017-12-27 DIAGNOSIS — O26891 Other specified pregnancy related conditions, first trimester: Secondary | ICD-10-CM

## 2017-12-27 DIAGNOSIS — O99611 Diseases of the digestive system complicating pregnancy, first trimester: Secondary | ICD-10-CM

## 2017-12-27 MED ORDER — METRONIDAZOLE 500 MG PO TABS: 500.0000 mg | ORAL_TABLET | Freq: Two times a day (BID) | ORAL | 0 refills | Status: DC

## 2017-12-27 NOTE — Telephone Encounter (Signed)
Patient called bc she threw up her flagyl a " few times" but she cannot remember how many pills she took. She would like a refill. Recommended she take flagyl with food 30 min after she has taken her promethazine. Patient verbalized understanding.

## 2018-01-13 ENCOUNTER — Other Ambulatory Visit (HOSPITAL_COMMUNITY)
Admission: RE | Admit: 2018-01-13 | Discharge: 2018-01-13 | Disposition: A | Discharge status: Home / Self Care | Payer: Medicaid Other | Source: Ambulatory Visit | Attending: Nurse Practitioner | Admitting: Nurse Practitioner

## 2018-01-13 ENCOUNTER — Encounter: Payer: Self-pay | Admitting: Nurse Practitioner

## 2018-01-13 ENCOUNTER — Ambulatory Visit (INDEPENDENT_AMBULATORY_CARE_PROVIDER_SITE_OTHER): Payer: Self-pay | Admitting: Clinical

## 2018-01-13 ENCOUNTER — Ambulatory Visit (INDEPENDENT_AMBULATORY_CARE_PROVIDER_SITE_OTHER): Payer: Medicaid Other | Admitting: Nurse Practitioner

## 2018-01-13 VITALS — BP 131/85 | HR 102 | Wt 269.7 lb

## 2018-01-13 DIAGNOSIS — Z6841 Body Mass Index (BMI) 40.0 and over, adult: Secondary | ICD-10-CM

## 2018-01-13 DIAGNOSIS — O0990 Supervision of high risk pregnancy, unspecified, unspecified trimester: Secondary | ICD-10-CM | POA: Insufficient documentation

## 2018-01-13 DIAGNOSIS — Z3480 Encounter for supervision of other normal pregnancy, unspecified trimester: Secondary | ICD-10-CM | POA: Diagnosis present

## 2018-01-13 DIAGNOSIS — F17200 Nicotine dependence, unspecified, uncomplicated: Secondary | ICD-10-CM

## 2018-01-13 DIAGNOSIS — B951 Streptococcus, group B, as the cause of diseases classified elsewhere: Secondary | ICD-10-CM

## 2018-01-13 DIAGNOSIS — F3162 Bipolar disorder, current episode mixed, moderate: Secondary | ICD-10-CM

## 2018-01-13 DIAGNOSIS — O2340 Unspecified infection of urinary tract in pregnancy, unspecified trimester: Secondary | ICD-10-CM

## 2018-01-13 DIAGNOSIS — Z3482 Encounter for supervision of other normal pregnancy, second trimester: Secondary | ICD-10-CM

## 2018-01-13 DIAGNOSIS — Z818 Family history of other mental and behavioral disorders: Secondary | ICD-10-CM

## 2018-01-13 DIAGNOSIS — F319 Bipolar disorder, unspecified: Secondary | ICD-10-CM | POA: Insufficient documentation

## 2018-01-13 MED ORDER — VITAFOL GUMMIES 3.33-0.333-34.8 MG PO CHEW: 3.0000 | CHEWABLE_TABLET | Freq: Every day | ORAL | 11 refills | Status: DC

## 2018-01-13 NOTE — Progress Notes (Signed)
Subjective:   Colleen Stone is a 22 y.o. G2P0010 at 2016w2d by early ultrasound being seen today for her first obstetrical visit.  Her obstetrical history is significant for obesity and history of bipolar - stopped her meds and anxiety, and recent miscarriage just before becoming pregnant, and history of seizures last in 2017. Upon learning she was pregnant stopped Ability, Depakote and Klonozapam.  Patient does intend to breast feed. Pregnancy history fully reviewed.  Currently using Miralax and having BMs every other day - had been seen in MAU for constipation.  Patient reports urinary frequency.  HISTORY: OB History  Gravida Para Term Preterm AB Living  2 0 0 0 1 0  SAB TAB Ectopic Multiple Live Births  1 0 0 0 0    # Outcome Date GA Lbr Len/2nd Weight Sex Delivery Anes PTL Lv  2 Current           1 SAB 2019           Past Medical History:  Diagnosis Date  . Asthma   . Chlamydia   . Gonorrhea   . PCOS (polycystic ovarian syndrome)   . Seizures (HCC)    Last seizure in 2017  . Trichomonas infection    Past Surgical History:  Procedure Laterality Date  . ADENOIDECTOMY    . ROOT CANAL    . TONSILLECTOMY    . WISDOM TOOTH EXTRACTION     No family history on file. Social History   Tobacco Use  . Smoking status: Current Some Day Smoker    Types: Cigarettes  . Smokeless tobacco: Never Used  Substance Use Topics  . Alcohol use: Yes    Comment: Social  . Drug use: Yes    Types: Marijuana    Comment: Last marijuana 10/03/17   Allergies  Allergen Reactions  . Latex Hives and Swelling  . Red Dye Nausea And Vomiting and Other (See Comments)    Her body rejects it   Current Outpatient Medications on File Prior to Visit  Medication Sig Dispense Refill  . polyethylene glycol powder (GLYCOLAX/MIRALAX) powder Take 17 g by mouth daily as needed for moderate constipation. 500 g 2  . Prenatal Vit-Fe Fumarate-FA (PRENATAL MULTIVITAMIN) TABS tablet Take 1 tablet by mouth  daily at 12 noon.    . promethazine (PHENERGAN) 25 MG tablet Take 1 tablet (25 mg total) by mouth every 6 (six) hours as needed for nausea or vomiting. 30 tablet 3  . metroNIDAZOLE (FLAGYL) 500 MG tablet Take 1 tablet (500 mg total) by mouth 2 (two) times daily. (Patient not taking: Reported on 01/13/2018) 14 tablet 0   No current facility-administered medications on file prior to visit.      Exam   Vitals:   01/13/18 1008  BP: 131/85  Pulse: (!) 102  Weight: 269 lb 11.2 oz (122.3 kg)   Fetal Heart Rate (bpm): 150  Uterus:  Fundal Height: 14 cm  Pelvic Exam: Perineum: no hemorrhoids, normal perineum   Vulva: normal external genitalia, no lesions   Vagina:  normal mucosa, normal discharge   Cervix: no lesions and normal, pap smear done.    Adnexa: normal adnexa and no mass, fullness, tenderness   Bony Pelvis: average  System: General: well-developed, well-nourished female in no acute distress   Breast:  normal appearance, no masses or tenderness, mild tenderness   Skin: normal coloration and turgor, no rashes   Neurologic: oriented, normal, negative, normal mood   Extremities: normal strength,  tone, and muscle mass, ROM of all joints is normal   HEENT extraocular movement intact and sclera clear, anicteric   Mouth/Teeth mucous membranes moist, pharynx normal without lesions and dental hygiene good   Neck supple and no masses, normal thyroid   Cardiovascular: regular rate and rhythm   Respiratory:  no respiratory distress, normal breath sounds   Abdomen: soft, non-tender; no masses,  no organomegaly     Assessment:   Pregnancy: G2P0010 Patient Active Problem List   Diagnosis Date Noted  . Supervision of other normal pregnancy, antepartum 01/13/2018  . Bipolar 1 disorder (HCC) 01/13/2018  . Family history of schizophrenia 01/13/2018  . Smoker 01/13/2018  . Atypical squamous cells of undetermined significance (ASCUS) on Papanicolaou smear of cervix 01/26/2013  .  Trichomonal vaginitis 09/02/2012  . Family history of breast cancer 08/31/2012  . Other acne 06/30/2008     Plan:  1. Supervision of other normal pregnancy, antepartum Will need to watch BP in this pregnancy -  No seizures since 2017 Advised to stop smoking  - Culture, OB Urine - Hemoglobinopathy Evaluation - Obstetric Panel, Including HIV - US MFM OB DETAIL +14 WK; Future - Cytology - PAP( [place]) - Genetic Screening - Inheritest(R) CF/SMA Panel  2.  BMI 44 Advised 10-20 pounds of weight gain in this pregnancy Did not get HA1C done today so CMA called and left a message for her to schedule this ASAP.  3.  History of anxiety and Bipolar Stopped her medications.  [facility] and wants to see another provider.  [facility] and has a list of resources to investigate.  Recommended seeing a provider to see if she currently needs medication for her mental health needs.  Initial labs drawn. Continue prenatal vitamins. Genetic Screening discussed, NIPS: ordered. Ultrasound discussed; detailed fetal anatomic survey: ordered. Problem list reviewed and updated. The nature of [facility] with multiple MDs and other Advanced Practice Providers was explained to patient; also emphasized that residents, students are part of our team. Routine obstetric precautions reviewed. Return in about 4 weeks (around 02/10/2018).  Total face-to-face time with patient: 40 minutes.  Over 50% of encounter was spent on counseling and coordination of care.     Nolene BernheimERRI , FNP Family Nurse Practitioner, [facility], [facility] 01/13/2018 12:28 PM

## 2018-01-13 NOTE — BH Specialist Note (Signed)
Integrated Behavioral Health Initial Visit  MRN: [id number] Name: Colleen Stone  Number of Integrated Behavioral Health Clinician visits:: 1/6 Session Start time: 9:44  Session End time: 10:08 Total time: 20 minutes  Type of Service: Integrated Behavioral Health- Individual/Family Interpretor:No. Interpretor Name and Language: n/a   Warm Hand Off Completed.       SUBJECTIVE: Colleen Stone is a 22 y.o. female accompanied by n/a Patient was referred by Nolene Bernheimerri Burleson, NP for Initial OB introduction to integrated behavioral health services . Patient reports the following symptoms/concerns: Pt states her primary concern today is having a history of bipolar affective disorder(mother has bipolar; father recently diagnosed with schizophrenia); prefers no [facility]. Pt had a previous miscarriage, and worries about taking BH medication during pregnancy. Pt took Klonozapam, Abilify, and Depakote 4 months ago; also concerned about acid reflux affecting her sleep quality.  Duration of problem: Current pregnancy; Severity of problem: moderate  OBJECTIVE: Mood: Anxious and Depressed and Affect: Appropriate Risk of harm to self or others: No plan to harm self or others  LIFE CONTEXT: Family and Social: Pt lives with FOB; has good support from family School/Work: - Self-Care: - Life Changes: Current pregnancy; previous miscarriage  GOALS ADDRESSED: Patient will: 1. Reduce symptoms of: anxiety, depression and mood instability 2. Increase knowledge and/or ability of: stress reduction  3. Demonstrate ability to: Increase healthy adjustment to current life circumstances  INTERVENTIONS: Interventions utilized: Psychoeducation and/or Health Education and Link to [facility]  Standardized Assessments completed: GAD-7 and PHQ 9  ASSESSMENT: Patient currently experiencing Bipolar affective disorder, current episode mixed, moderate   Patient may benefit from psychoeducation and  brief therapeutic interventions regarding coping with symptoms of anxiety and depression related to bipolar disorder .  PLAN: 1. Follow up with behavioral health clinician on : Next medical visit; earlier, if needed 2. Behavioral recommendations:  -Read educational materials regarding coping with symptoms of anxiety and depression related to bipolar disorder -Consider NAMI Family-to-Family support -Consider establishing care with psychiatry, as discussed in office visit, prior to 3rd trimester.  3. Referral(s): Integrated Art gallery manager[facility]al Health Services (In Clinic), Community Mental Health Services (LME/Outside Clinic) and Community Resources:  NAMI support 4. "From scale of 1-10, how likely are you to follow plan?": 9  Valetta CloseJamie C Pernie Grosso, LCSW  Depression screen [facility] 2/9 01/13/2018  Decreased Interest 1  Down, Depressed, Hopeless 2  PHQ - 2 Score 3  Altered sleeping 2  Tired, decreased energy 3  Change in appetite 2  Feeling bad or failure about yourself  0  Trouble concentrating 0  Moving slowly or fidgety/restless 0  Suicidal thoughts 0  PHQ-9 Score 10   GAD 7 : Generalized Anxiety Score 01/13/2018  Nervous, Anxious, on Edge 1  Control/stop worrying 2  Worry too much - different things 3  Trouble relaxing 2  Restless 1  Easily annoyed or irritable 3  Afraid - awful might happen 3  Total GAD 7 Score 15

## 2018-01-13 NOTE — Patient Instructions (Addendum)
Advised to gain 10-20 pounds this pregnancy. Safe Medications in Pregnancy   Acne: Benzoyl Peroxide Salicylic Acid  Backache/Headache: Tylenol: 2 regular strength every 4 hours OR              2 Extra strength every 6 hours  Colds/Coughs/Allergies: Benadryl (alcohol free) 25 mg every 6 hours as needed Breath right strips Claritin Cepacol throat lozenges Chloraseptic throat spray Cold-Eeze- up to three times per day Cough drops, alcohol free Flonase (by prescription only) Guaifenesin Mucinex Robitussin DM (plain only, alcohol free) Saline nasal spray/drops Sudafed (pseudoephedrine) & Actifed ** use only after [redacted] weeks gestation and if you do not have high blood pressure Tylenol Vicks Vaporub Zinc lozenges Zyrtec   Constipation: Colace Ducolax suppositories Fleet enema Glycerin suppositories Metamucil Milk of magnesia Miralax Senokot Smooth move tea  Diarrhea: Kaopectate Imodium A-D  *NO pepto Bismol  Hemorrhoids: Anusol Anusol HC Preparation H Tucks  Indigestion: Tums Maalox Mylanta Zantac  Pepcid  Insomnia: Benadryl (alcohol free) 25mg  every 6 hours as needed Tylenol PM Unisom, no Gelcaps  Leg Cramps: Tums MagGel  Nausea/Vomiting:  Bonine Dramamine Emetrol Ginger extract Sea bands Meclizine  Nausea medication to take during pregnancy:  Unisom (doxylamine succinate 25 mg tablets) Take one tablet daily at bedtime. If symptoms are not adequately controlled, the dose can be increased to a maximum recommended dose of two tablets daily (1/2 tablet in the morning, 1/2 tablet mid-afternoon and one at bedtime). Vitamin B6 100mg  tablets. Take one tablet twice a day (up to 200 mg per day).  Skin Rashes: Aveeno products Benadryl cream or 25mg  every 6 hours as needed Calamine Lotion 1% cortisone cream  Yeast infection: Gyne-lotrimin 7 Monistat 7   **If taking multiple medications, please check labels to avoid duplicating the same active  ingredients **take medication as directed on the label ** Do not exceed 4000 mg of tylenol in 24 hours **Do not take medications that contain aspirin or ibuprofen     Second Trimester of Pregnancy  The second trimester is from week 14 through week 27 (month 4 through 6). This is often the time in pregnancy that you feel your best. Often times, morning sickness has lessened or quit. You may have more energy, and you may get hungry more often. Your unborn baby is growing rapidly. At the end of the sixth month, he or she is about 9 inches long and weighs about 1 pounds. You will likely feel the baby move between 18 and 20 weeks of pregnancy. Follow these instructions at home: Medicines  Take over-the-counter and prescription medicines only as told by your doctor. Some medicines are safe and some medicines are not safe during pregnancy.  Take a prenatal vitamin that contains at least 600 micrograms (mcg) of folic acid.  If you have trouble pooping (constipation), take medicine that will make your stool soft (stool softener) if your doctor approves. Eating and drinking   Eat regular, healthy meals.  Avoid raw meat and uncooked cheese.  If you get low calcium from the food you eat, talk to your doctor about taking a daily calcium supplement.  Avoid foods that are high in fat and sugars, such as fried and sweet foods.  If you feel sick to your stomach (nauseous) or throw up (vomit): ? Eat 4 or 5 small meals a day instead of 3 large meals. ? Try eating a few soda crackers. ? Drink liquids between meals instead of during meals.  To prevent constipation: ? Eat foods that  are high in fiber, like fresh fruits and vegetables, whole grains, and beans. ? Drink enough fluids to keep your pee (urine) clear or pale yellow. Activity  Exercise only as told by your doctor. Stop exercising if you start to have cramps.  Do not exercise if it is too hot, too humid, or if you are in a place of  great height (high altitude).  Avoid heavy lifting.  Wear low-heeled shoes. Sit and stand up straight.  You can continue to have sex unless your doctor tells you not to. Relieving pain and discomfort  Wear a good support bra if your breasts are tender.  Take warm water baths (sitz baths) to soothe pain or discomfort caused by hemorrhoids. Use hemorrhoid cream if your doctor approves.  Rest with your legs raised if you have leg cramps or low back pain.  If you develop puffy, bulging veins (varicose veins) in your legs: ? Wear support hose or compression stockings as told by your doctor. ? Raise (elevate) your feet for 15 minutes, 3-4 times a day. ? Limit salt in your food. Prenatal care  Write down your questions. Take them to your prenatal visits.  Keep all your prenatal visits as told by your doctor. This is important. Safety  Wear your seat belt when driving.  Make a list of emergency phone numbers, including numbers for family, friends, the hospital, and police and fire departments. General instructions  Ask your doctor about the right foods to eat or for help finding a counselor, if you need these services.  Ask your doctor about local prenatal classes. Begin classes before month 6 of your pregnancy.  Do not use hot tubs, steam rooms, or saunas.  Do not douche or use tampons or scented sanitary pads.  Do not cross your legs for long periods of time.  Visit your dentist if you have not done so. Use a soft toothbrush to brush your teeth. Floss gently.  Avoid all smoking, herbs, and alcohol. Avoid drugs that are not approved by your doctor.  Do not use any products that contain nicotine or tobacco, such as cigarettes and e-cigarettes. If you need help quitting, ask your doctor.  Avoid cat litter boxes and soil used by cats. These carry germs that can cause birth defects in the baby and can cause a loss of your baby (miscarriage) or stillbirth. Contact a doctor  if:  You have mild cramps or pressure in your lower belly.  You have pain when you pee (urinate).  You have bad smelling fluid coming from your vagina.  You continue to feel sick to your stomach (nauseous), throw up (vomit), or have watery poop (diarrhea).  You have a nagging pain in your belly area.  You feel dizzy. Get help right away if:  You have a fever.  You are leaking fluid from your vagina.  You have spotting or bleeding from your vagina.  You have severe belly cramping or pain.  You lose or gain weight rapidly.  You have trouble catching your breath and have chest pain.  You notice sudden or extreme puffiness (swelling) of your face, hands, ankles, feet, or legs.  You have not felt the baby move in over an hour.  You have severe headaches that do not go away when you take medicine.  You have trouble seeing. Summary  The second trimester is from week 14 through week 27 (months 4 through 6). This is often the time in pregnancy that you feel your best.  To take care of yourself and your unborn baby, you will need to eat healthy meals, take medicines only if your doctor tells you to do so, and do activities that are safe for you and your baby.  Call your doctor if you get sick or if you notice anything unusual about your pregnancy. Also, call your doctor if you need help with the right food to eat, or if you want to know what activities are safe for you. This information is not intended to replace advice given to you by your health care provider. Make sure you discuss any questions you have with your health care provider. Document Released: 03/27/2009 Document Revised: 02/06/2016 Document Reviewed: 02/06/2016 Elsevier Interactive Patient Education  2019 [employer]

## 2018-01-14 NOTE — L&D Delivery Note (Addendum)
OB/GYN Faculty Practice Delivery Note  Colleen Stone is a 23 y.o. G2P0010 s/p NSVD at [redacted]w[redacted]d. She was admitted for IOL 2/2 CHTN on meds.   ROM: 2h 56m with clear fluid GBS Status: Positive Maximum Maternal Temperature: 98.7  Labor Progress: . Admitted for IOL. Received one dose of cytotec. Pitocin started, but patient only got 72mu/min to get to complete. SROM for clear fluid.   Delivery Date/Time: 07/05/18 at 1817  Delivery: Called to room and patient was complete and pushing. Head delivered LOA. Nuchal cord present X1. Shoulder and body delivered in usual fashion. Infant with spontaneous cry, placed on mother's abdomen, dried and stimulated. Cord clamped x 2 after 1-minute delay, and cut by FOB. Cord blood drawn. Placenta delivered spontaneously with gentle cord traction. Fundus firm with massage and Pitocin. Labia, perineum, vagina, and cervix inspected inspected with 1st degree vaginal laceration. Not hemostatic, repaired with 4-0 Monocryl.   Placenta: Spontaneous  complete  intact Complications: none Lacerations: 1st degree EBL: 300 cc Analgesia: Epidural, local   Postpartum Planning [x]  message to sent to schedule follow-up    Infant: Female  APGARs 9/9  pending  Marcille Buffy DNP, CNM  07/05/18  6:44 PM

## 2018-01-15 ENCOUNTER — Encounter: Payer: Self-pay | Admitting: *Deleted

## 2018-01-16 LAB — CYTOLOGY - PAP
Candida vaginitis: NEGATIVE
Chlamydia: NEGATIVE
DIAGNOSIS: NEGATIVE
Neisseria Gonorrhea: NEGATIVE
Trichomonas: NEGATIVE

## 2018-01-18 LAB — URINE CULTURE, OB REFLEX

## 2018-01-18 LAB — CULTURE, OB URINE

## 2018-01-21 ENCOUNTER — Encounter: Payer: Self-pay | Admitting: Nurse Practitioner

## 2018-01-21 DIAGNOSIS — B951 Streptococcus, group B, as the cause of diseases classified elsewhere: Secondary | ICD-10-CM | POA: Insufficient documentation

## 2018-01-21 DIAGNOSIS — O98819 Other maternal infectious and parasitic diseases complicating pregnancy, unspecified trimester: Secondary | ICD-10-CM

## 2018-01-21 MED ORDER — AMOXICILLIN 500 MG PO TABS: 500.0000 mg | ORAL_TABLET | Freq: Three times a day (TID) | ORAL | 0 refills | Status: AC

## 2018-01-21 NOTE — Addendum Note (Signed)
Addended by: Currie Paris on: 01/21/2018 09:29 AM   Modules accepted: Orders

## 2018-01-21 NOTE — Progress Notes (Signed)
GBS in urine noted.  Medication sent to her pharmacy and message sent to clinical staff to call and review with her.  Nolene Bernheim, RN, MSN, NP-BC Nurse Practitioner, [facility] for [employer], [facility] 01/21/2018 9:27 AM

## 2018-01-22 ENCOUNTER — Telehealth: Payer: Self-pay | Admitting: General Practice

## 2018-01-22 NOTE — Telephone Encounter (Signed)
Called patient, no answer- left message on nurse voicemail line stating we are trying to reach you with results. Your most recent urine culture shows you have a urinary tract infection. We have sent antibiotics to your pharmacy for treatment, please pick up your prescription and take as directed. Will also send mychart message.

## 2018-01-22 NOTE — Telephone Encounter (Signed)
-----   Message from Currie Pariserri L Burleson, NP sent at 01/21/2018  9:29 AM EST ----- Positive GBS - medication sent to her pharmacy and message to clinical staff to call and review it with her.  Also get her scheduled for HA1C and fasting CBG before her next visit.  We missed this on [facility] and I don't see that she has been scheduled for this.  Thanks.

## 2018-01-23 ENCOUNTER — Other Ambulatory Visit: Payer: Self-pay

## 2018-01-23 ENCOUNTER — Inpatient Hospital Stay (HOSPITAL_COMMUNITY)
Admission: AD | Admit: 2018-01-23 | Discharge: 2018-01-23 | Disposition: A | Discharge status: Home / Self Care | Payer: Medicaid Other | Attending: Family Medicine | Admitting: Family Medicine

## 2018-01-23 ENCOUNTER — Encounter (HOSPITAL_COMMUNITY): Payer: Self-pay | Admitting: *Deleted

## 2018-01-23 DIAGNOSIS — O99612 Diseases of the digestive system complicating pregnancy, second trimester: Secondary | ICD-10-CM | POA: Diagnosis not present

## 2018-01-23 DIAGNOSIS — Z3A15 15 weeks gestation of pregnancy: Secondary | ICD-10-CM | POA: Diagnosis not present

## 2018-01-23 DIAGNOSIS — K5900 Constipation, unspecified: Secondary | ICD-10-CM | POA: Diagnosis not present

## 2018-01-23 DIAGNOSIS — O26892 Other specified pregnancy related conditions, second trimester: Secondary | ICD-10-CM | POA: Insufficient documentation

## 2018-01-23 LAB — OBSTETRIC PANEL, INCLUDING HIV
Antibody Screen: NEGATIVE
BASOS ABS: 0 10*3/uL (ref 0.0–0.2)
Basos: 0 %
EOS (ABSOLUTE): 0 10*3/uL (ref 0.0–0.4)
EOS: 0 %
HEP B S AG: NEGATIVE
HIV SCREEN 4TH GENERATION: NONREACTIVE
Hematocrit: 39.1 % (ref 34.0–46.6)
Hemoglobin: 13.8 g/dL (ref 11.1–15.9)
Immature Grans (Abs): 0 10*3/uL (ref 0.0–0.1)
Immature Granulocytes: 0 %
LYMPHS ABS: 2.1 10*3/uL (ref 0.7–3.1)
Lymphs: 25 %
MCH: 28.6 pg (ref 26.6–33.0)
MCHC: 35.3 g/dL (ref 31.5–35.7)
MCV: 81 fL (ref 79–97)
Monocytes Absolute: 0.4 10*3/uL (ref 0.1–0.9)
Monocytes: 5 %
NEUTROS ABS: 5.6 10*3/uL (ref 1.4–7.0)
Neutrophils: 70 %
PLATELETS: 311 10*3/uL (ref 150–450)
RBC: 4.82 x10E6/uL (ref 3.77–5.28)
RDW: 15.1 % (ref 12.3–15.4)
RH TYPE: POSITIVE
RPR: NONREACTIVE
Rubella Antibodies, IGG: 2.77 index (ref >0.99)
WBC: 8.1 10*3/uL (ref 3.4–10.8)

## 2018-01-23 LAB — INHERITEST(R) CF/SMA PANEL

## 2018-01-23 LAB — URINALYSIS, ROUTINE W REFLEX MICROSCOPIC
Bilirubin Urine: NEGATIVE
GLUCOSE, UA: NEGATIVE mg/dL
Hgb urine dipstick: NEGATIVE
Ketones, ur: NEGATIVE mg/dL
Leukocytes, UA: NEGATIVE
Nitrite: NEGATIVE
Protein, ur: NEGATIVE mg/dL
Specific Gravity, Urine: 1.016 (ref 1.005–1.030)
pH: 7 (ref 5.0–8.0)

## 2018-01-23 LAB — HEMOGLOBINOPATHY EVALUATION
Ferritin: 24 ng/mL (ref 15–150)
HGB A2 QUANT: 2.2 % (ref 1.8–3.2)
HGB A: 97.8 % (ref 96.4–98.8)
HGB S: 0 %
Hgb C: 0 %
Hgb F Quant: 0 % (ref 0.0–2.0)
Hgb Solubility: NEGATIVE
Hgb Variant: 0 %

## 2018-01-23 MED ORDER — FLEET ENEMA 7-19 GM/118ML RE ENEM
1.0000 | ENEMA | Freq: Once | RECTAL | Status: AC
  Administered 2018-01-23: 1 via RECTAL

## 2018-01-23 NOTE — Progress Notes (Signed)
Pt reports good results from enema, states has had several BM's.

## 2018-01-23 NOTE — MAU Provider Note (Signed)
History     [name]: [id number]  Arrival date and time: 01/23/18 1418   First Provider Initiated Contact with Patient 01/23/18 1513      Chief Complaint  Patient presents with  . abdominal cramping  . Constipation  . Rectal Bleeding   HPI   Colleen Stone is a 23 y.o. female G2P0010 @ [id number] here in MAU with complaints of constipation and rectal bleeding. She has not had a BM in 2 weeks. She has been try miralax which is not helping. She is doing the miralax every single day. She first noticed the rectal bleeding 3 weeks ago. No vaginal bleeding and says she is positive about this.   OB History    Gravida  2   Para      Term      Preterm      AB  1   Living        SAB  1   TAB      Ectopic      Multiple      Live Births              Past Medical History:  Diagnosis Date  . Asthma   . Chlamydia   . Gonorrhea   . PCOS (polycystic ovarian syndrome)   . Seizures (HCC)    Last seizure in 2017  . Trichomonas infection     Past Surgical History:  Procedure Laterality Date  . ADENOIDECTOMY    . ROOT CANAL    . TONSILLECTOMY    . WISDOM TOOTH EXTRACTION      Family History  Problem Relation Age of Onset  . Asthma Mother   . Heart disease Father     Social History   Tobacco Use  . Smoking status: Former Smoker    Types: Cigarettes    Last attempt to quit: 10/03/2017    Years since quitting: 0.3  . Smokeless tobacco: Never Used  Substance Use Topics  . Alcohol use: Not Currently    Comment: Social  . Drug use: Yes    Types: Marijuana    Comment: Last marijuana 10/03/17    Allergies:  Allergies  Allergen Reactions  . Latex Hives and Swelling  . Red Dye Nausea And Vomiting and Other (See Comments)    Her body rejects it    Medications Prior to Admission  Medication Sig Dispense Refill Last Dose  . amoxicillin (AMOXIL) 500 MG tablet Take 1 tablet (500 mg total) by mouth 3 (three) times daily for 7 days. 21 tablet 0   .  metroNIDAZOLE (FLAGYL) 500 MG tablet Take 1 tablet (500 mg total) by mouth 2 (two) times daily. (Patient not taking: Reported on 01/13/2018) 14 tablet 0 Not Taking  . polyethylene glycol powder (GLYCOLAX/MIRALAX) powder Take 17 g by mouth daily as needed for moderate constipation. 500 g 2 Taking  . Prenatal Vit-Fe Fumarate-FA (PRENATAL MULTIVITAMIN) TABS tablet Take 1 tablet by mouth daily at 12 noon.   Taking  . Prenatal Vit-Fe Phos-FA-Omega (VITAFOL GUMMIES) 3.33-0.333-34.8 MG CHEW Chew 3 each by mouth daily. 90 tablet 11   . promethazine (PHENERGAN) 25 MG tablet Take 1 tablet (25 mg total) by mouth every 6 (six) hours as needed for nausea or vomiting. 30 tablet 3 Taking   Results for orders placed or performed during the hospital encounter of 01/23/18 (from the past 48 hour(s))  Urinalysis, Routine w reflex microscopic     Status: Abnormal   Collection Time: 01/23/18  2:40  PM  Result Value Ref Range   Color, Urine YELLOW YELLOW   APPearance CLOUDY (A) CLEAR   Specific Gravity, Urine 1.016 1.005 - 1.030   pH 7.0 5.0 - 8.0   Glucose, UA NEGATIVE NEGATIVE mg/dL   Hgb urine dipstick NEGATIVE NEGATIVE   Bilirubin Urine NEGATIVE NEGATIVE   Ketones, ur NEGATIVE NEGATIVE mg/dL   Protein, ur NEGATIVE NEGATIVE mg/dL   Nitrite NEGATIVE NEGATIVE   Leukocytes, UA NEGATIVE NEGATIVE    Comment: Performed at [facility], [address] [place], [place] [address]   Review of Systems  Constitutional: Negative for fever.  Gastrointestinal: Positive for constipation. Negative for abdominal pain, nausea and vomiting.   Physical Exam   Blood pressure 124/80, pulse (!) 106, temperature 98.2 F (36.8 C), temperature source Oral, resp. rate 20, height 5' 4.5" (1.638 m), weight 122.5 kg, last menstrual period 08/26/2017, SpO2 99 %.  Physical Exam  Constitutional: She is oriented to person, place, and time. She appears well-developed and well-nourished. No distress.  HENT:  Head: Normocephalic.   Eyes: Pupils are equal, round, and reactive to light.  Respiratory: Effort normal.  GI: Soft. She exhibits no distension. There is no abdominal tenderness. There is no rebound and no guarding.  Genitourinary:    Rectum normal.  Rectum:     No rectal mass.     Genitourinary Comments: No blood noted on exam glove following rectal exam. Stool felt in rectum.    Musculoskeletal: Normal range of motion.  Neurological: She is alert and oriented to person, place, and time.  Skin: Skin is warm. She is not diaphoretic.  Psychiatric: Her behavior is normal.   MAU Course  Procedures None  MDM  Fleets enema given Patient reports multiple large BM's and feels much better   Assessment and Plan   A:  1. Constipation in pregnancy in second trimester   2. [redacted] weeks gestation of pregnancy     P:  Discharge home in stable condition Discussed miralax clean out at home Return to MAU for emergencies  Kennedy Brines, Harolyn RutherfordJennifer I, NP 01/28/2018 10:22 AM

## 2018-01-23 NOTE — Discharge Instructions (Signed)

## 2018-01-23 NOTE — MAU Note (Signed)
Presents with c/o lower abdominal cramping, constipation, and rectal bleeding.  Reports attempting to have BM, attempt unsuccessful.  Reports began rectal bleeding while attempting BM.  States taking Miralax daily, poor results.

## 2018-01-26 ENCOUNTER — Encounter: Payer: Self-pay | Admitting: *Deleted

## 2018-02-02 ENCOUNTER — Telehealth: Payer: Self-pay | Admitting: Student

## 2018-02-02 NOTE — Telephone Encounter (Signed)
Called patient to inform of upcoming appointment.

## 2018-02-03 ENCOUNTER — Encounter: Payer: Self-pay | Admitting: Student

## 2018-02-06 ENCOUNTER — Encounter (HOSPITAL_COMMUNITY): Payer: Self-pay

## 2018-02-09 ENCOUNTER — Other Ambulatory Visit: Payer: Self-pay | Admitting: *Deleted

## 2018-02-09 DIAGNOSIS — Z6841 Body Mass Index (BMI) 40.0 and over, adult: Secondary | ICD-10-CM

## 2018-02-09 DIAGNOSIS — Z3480 Encounter for supervision of other normal pregnancy, unspecified trimester: Secondary | ICD-10-CM

## 2018-02-11 ENCOUNTER — Other Ambulatory Visit: Payer: Medicaid Other

## 2018-02-11 ENCOUNTER — Ambulatory Visit (INDEPENDENT_AMBULATORY_CARE_PROVIDER_SITE_OTHER): Payer: Medicaid Other | Admitting: Medical

## 2018-02-11 VITALS — BP 131/78 | HR 97 | Wt 274.0 lb

## 2018-02-11 DIAGNOSIS — Z3480 Encounter for supervision of other normal pregnancy, unspecified trimester: Secondary | ICD-10-CM

## 2018-02-11 DIAGNOSIS — Z6841 Body Mass Index (BMI) 40.0 and over, adult: Secondary | ICD-10-CM

## 2018-02-11 DIAGNOSIS — O99212 Obesity complicating pregnancy, second trimester: Secondary | ICD-10-CM | POA: Insufficient documentation

## 2018-02-11 DIAGNOSIS — Z3482 Encounter for supervision of other normal pregnancy, second trimester: Secondary | ICD-10-CM

## 2018-02-11 LAB — GLUCOSE, CAPILLARY: GLUCOSE-CAPILLARY: 74 mg/dL (ref 70–99)

## 2018-02-11 LAB — HEMOGLOBIN A1C
Est. average glucose Bld gHb Est-mCnc: 91 mg/dL
Hgb A1c MFr Bld: 4.8 % (ref 4.8–5.6)

## 2018-02-11 NOTE — Progress Notes (Signed)
   PRENATAL VISIT NOTE  Subjective:  Colleen Stone is a 23 y.o. G2P0010 at [redacted]w[redacted]d being seen today for ongoing prenatal care.  She is currently monitored for the following issues for this high-risk pregnancy and has Supervision of other normal pregnancy, antepartum; Bipolar 1 disorder (HCC); Atypical squamous cells of undetermined significance (ASCUS) on Papanicolaou smear of cervix; Family history of breast cancer; Other acne; Family history of schizophrenia; Smoker; Group B streptococcal infection during pregnancy; and Obesity affecting pregnancy in second trimester on their problem list.  Patient reports mild LE edema.  Contractions: Not present. Vag. Bleeding: None.  Movement: Present. Denies leaking of fluid.   The following portions of the patient's history were reviewed and updated as appropriate: allergies, current medications, past family history, past medical history, past social history, past surgical history and problem list. Problem list updated.  Objective:   Vitals:   02/11/18 0944  BP: 131/78  Pulse: 97  Weight: 274 lb (124.3 kg)    Fetal Status: Fetal Heart Rate (bpm): 144   Movement: Present     General:  Alert, oriented and cooperative. Patient is in no acute distress.  Skin: Skin is warm and dry. No rash noted.   Cardiovascular: Normal heart rate noted  Respiratory: Normal respiratory effort, no problems with respiration noted  Abdomen: Soft, gravid, appropriate for gestational age.  Pain/Pressure: Absent     Pelvic: Cervical exam deferred        Extremities: Normal range of motion.  Edema: Mild pitting, slight indentation  Mental Status: Normal mood and affect. Normal behavior. Normal judgment and thought content.   Assessment and Plan:  Pregnancy: G2P0010 at [redacted]w[redacted]d  1. Supervision of other normal pregnancy, antepartum - Doing well - [place] scheduled for 02/13/18 - Declined AFP today   2. Obesity affecting pregnancy in second trimester - Advised 81 mg ASA  daily. Patient agrees.  - CMP and Hgb A1c drawn today  - Fasting CBG normal   Preterm labor/second trimester warning symptoms and general obstetric precautions including but not limited to vaginal bleeding, contractions, leaking of fluid and fetal movement were reviewed in detail with the patient. Please refer to After Visit Summary for other counseling recommendations.  Return in about 4 weeks (around 03/11/2018) for LOB.  Future Appointments  Date Time Provider Department Center  02/13/2018  8:30 AM [facility] [place] 1 [facility]    Vonzella Nipple, PA-C

## 2018-02-11 NOTE — Patient Instructions (Signed)
Childbirth Education Options: [facility] Classes:  Childbirth education classes can help you get ready for a positive parenting experience. You can also meet other expectant parents and get free stuff for your baby. Each class runs for five weeks on the same night and costs $45 for the mother-to-be and her support person. Medicaid covers the cost if you are eligible. Call [phone] to register. [facility] Childbirth Education:  [phone] or [phone] or [email or web address]  Baby & Me Class: Discuss newborn & infant parenting and family adjustment issues with other new mothers in a relaxed environment. Each week brings a new speaker or baby-centered activity. We encourage new mothers to join us every Thursday at 11:00am. Babies birth until crawling. No registration or fee. Daddy Boot Camp: This course offers Dads-to-be the tools and knowledge needed to feel confident on their journey to becoming new fathers. Experienced dads, who have been trained as coaches, teach dads-to-be how to hold, comfort, diaper, swaddle and play with their infant while being able to support the new mom as well. A class for men taught by men. $25/dad Big Brother/Big Sister: Let your children share in the joy of a new brother or sister in this special class designed just for them. Class includes discussion about how families care for babies: swaddling, holding, diapering, safety as well as how they can be helpful in their new role. This class is designed for children ages 2 to 6, but any age is welcome. Please register each child individually. $5/child  Mom Talk: This mom-led group offers support and connection to mothers as they journey through the adjustments and struggles of that sometimes overwhelming first year after the birth of a child. Tuesdays at 10:00am and Thursdays at 6:00pm. Babies welcome. No registration or fee. Breastfeeding Support Group: This group is a mother-to-mother  support circle where moms have the opportunity to share their breastfeeding experiences. A Lactation Consultant is present for questions and concerns. Meets each Tuesday at 11:00am. No fee or registration. Breastfeeding Your Baby: Learn what to expect in the first days of breastfeeding your newborn.  This class will help you feel more confident with the skills needed to begin your breastfeeding experience. Many new mothers are concerned about breastfeeding after leaving the hospital. This class will also address the most common fears and challenges about breastfeeding during the first few weeks, months and beyond. (call for fee) Comfort Techniques and Tour: This 2 hour interactive class will provide you the opportunity to learn & practice hands-on techniques that can help relieve some of the discomfort of labor and encourage your baby to rotate toward the best position for birth. You and your partner will be able to try a variety of labor positions with birth balls and rebozos as well as practice breathing, relaxation, and visualization techniques. A tour of the [facility] is included with this class. $20 per registrant and support person Childbirth Class- Weekend Option: This class is a Weekend version of our Birth & Baby series. It is designed for parents who have a difficult time fitting several weeks of classes into their schedule. It covers the care of your newborn and the basics of labor and childbirth. It also includes a Maternity Care Center Tour of [facility] and lunch. The class is held two consecutive days: beginning on Friday evening from 6:30 - 8:30 p.m. and the next day, Saturday from 9 a.m. - 4 p.m. (call for fee) Waterbirth Class: Interested in a waterbirth?  This   informational class will help you discover whether waterbirth is the right fit for you. Education about waterbirth itself, supplies you would need and how to assemble your support team is what you can  expect from this class. Some obstetrical practices require this class in order to pursue a waterbirth. (Not all obstetrical practices offer waterbirth-check with your healthcare provider.) Register only the expectant mom, but you are encouraged to bring your partner to class! Required if planning waterbirth, no fee. Infant/Child CPR: Parents, grandparents, babysitters, and friends learn Cardio-Pulmonary Resuscitation skills for infants and children. You will also learn how to treat both conscious and unconscious choking in infants and children. This Family & Friends program does not offer certification. Register each participant individually to ensure that enough mannequins are available. (Call for fee) Grandparent Love: Expecting a grandbaby? This class is for you! Learn about the latest infant care and safety recommendations and ways to support your own child as he or she transitions into the parenting role. Taught by Registered Nurses who are childbirth instructors, but most importantly...they are grandmothers too! $10/person. Childbirth Class- Natural Childbirth: This series of 5 weekly classes is for expectant parents who want to learn and practice natural methods of coping with the process of labor and childbirth. Relaxation, breathing, massage, visualization, role of the partner, and helpful positioning are highlighted. Participants learn how to be confident in their body's ability to give birth. This class will empower and help parents make informed decisions about their own care. Includes discussion that will help new parents transition into the immediate postpartum period. Maternity Care Center Tour of [facility] is included. We suggest taking this class between 25-32 weeks, but it's only a recommendation. $75 per registrant and one support person or $30 Medicaid. Childbirth Class- 3 week Series: This option of 3 weekly classes helps you and your labor partner prepare for childbirth. Newborn  care, labor & birth, cesarean birth, pain management, and comfort techniques are discussed and a Maternity Care Center Tour of [facility] is included. The class meets at the same time, on the same day of the week for 3 consecutive weeks beginning with the starting date you choose. $60 for registrant and one support person.  Marvelous Multiples: Expecting twins, triplets, or more? This class covers the differences in labor, birth, parenting, and breastfeeding issues that face multiples' parents. NICU tour is included. Led by a Certified Childbirth Educator who is the mother of twins. No fee. Caring for Baby: This class is for expectant and adoptive parents who want to learn and practice the most up-to-date newborn care for their babies. Focus is on birth through the first six weeks of life. Topics include feeding, bathing, diapering, crying, umbilical cord care, circumcision care and safe sleep. Parents learn to recognize symptoms of illness and when to call the pediatrician. Register only the mom-to-be and your partner or support person can plan to come with you! $10 per registrant and support person Childbirth Class- online option: This online class offers you the freedom to complete a Birth and Baby series in the comfort of your own home. The flexibility of this option allows you to review sections at your own pace, at times convenient to you and your support people. It includes additional video information, animations, quizzes, and extended activities. Get organized with helpful eClass tools, checklists, and trackers. Once you register online for the class, you will receive an email within a few days to accept the invitation and begin the class when the time   is right for you. The content will be available to you for 60 days. $60 for 60 days of online access for you and your support people.  Local Doulas: Natural Baby Doulas [email or web address] Tel:  [phone] [email or web address] [email or web address] [phone] [email or web address] The Labor Ladies  (also do waterbirth tub rental) [phone] [email or web address] [email or web address] [phone] [email or web address] Sacred Rhythms  [phone] [email or web address] [place] [employer] (PADA) [email or web address] http://www.[email or web address].org/index.htm [place] Birth [facility] Baby  [email or web address] Considering Waterbirth? Guide for patients at [facility]  Why consider waterbirth?  . Gentle birth for babies . Less pain medicine used in labor . May allow for passive descent/less pushing . May reduce perineal tears  . More mobility and instinctive maternal position changes . Increased maternal relaxation . Reduced blood pressure in labor  Is waterbirth safe? What are the risks of infection, drowning or other complications?  . Infection: o Very low risk (3.7 % for tub vs 4.8% for bed) o 7 in 8000 waterbirths with documented infection o Poorly cleaned equipment most common cause o Slightly lower group B strep transmission rate  . Drowning o Maternal:  - Very low risk   - Related to seizures or fainting o Newborn:  - Very low risk. No evidence of increased risk of respiratory problems in multiple large studies - Physiological protection from breathing under water - Avoid underwater birth if there are any fetal complications - Once baby's head is out of the water, keep it out.  . Birth complication o Some reports of cord trauma, but risk decreased by bringing baby to surface gradually o No evidence of increased risk of shoulder dystocia. Mothers can usually change positions faster in water than in a bed, possibly aiding the maneuvers to free the shoulder.   You must attend a Waterbirth class at [facility]  3rd Wednesday of every month from 7-9pm  Free  Register by calling [phone] or online at [email or web address]  Bring us the certificate from the class to your prenatal appointment  Meet with a midwife at 36 weeks to see if you can still plan a waterbirth and to sign the consent.   Purchase or rent the following supplies:   Water Birth Pool (Birth Pool in a Box or [place] for instance)  (Tubs start ~$125)  Single-use disposable tub liner designed for your brand of tub  New garden hose labeled "lead-free", "suitable for drinking water",  Electric drain pump to remove water (We recommend 792 gallon per hour or greater pump.)   Separate garden hose to remove the dirty water  Fish net  Bathing suit top (optional)  Long-handled mirror (optional)  Places to purchase or rent supplies  [email or web address] for tub purchases and supplies  [email or web address] for tub purchases and supplies  The Labor Ladies ([email or web address]) $275 for tub rental/set-up & take down/kit   [facility] [employer] (http://www.padanc.org/MeetUs.htm) Information regarding doulas (labor support) who provide pool rentals  Our practice has a Birth Pool in a Box tub at the hospital that you may borrow on a first-come-first-served basis. It is your responsibility to to set up, clean and break down the tub. We cannot guarantee the availability of this tub in advance. You are responsible for bringing all accessories listed above. If you do not have all necessary supplies you cannot have a waterbirth.    Things that would prevent you from having a waterbirth:  Premature, <37wks  Previous cesarean birth  Presence of thick meconium-stained fluid  Multiple gestation (Twins,   triplets, etc.)  Uncontrolled diabetes or gestational diabetes requiring medication  Hypertension requiring medication or diagnosis of pre-eclampsia  Heavy vaginal bleeding  Non-reassuring fetal  heart rate  Active infection (MRSA, etc.). Group B Strep is NOT a contraindication for  waterbirth.  If your labor has to be induced and induction method requires continuous  monitoring of the baby's heart rate  Other risks/issues identified by your obstetrical provider  Please remember that birth is unpredictable. Under certain unforeseeable circumstances your provider may advise against giving birth in the tub. These decisions will be made on a case-by-case basis and with the safety of you and your baby as our highest priority.   AREA PEDIATRIC/FAMILY PRACTICE PHYSICIANS  [facility] [address], [address] [place], [place]  [address] Phone - [phone]   Fax - [phone]  [facility] [address] [place], [place] [address] Phone - [phone]   Fax - [phone]  [facility] [address] [place], [place]  [address] Phone - [phone]   Fax - [phone]  [facility] [address] [place], [place]  [address] Phone - [phone]   Fax - [phone]  [facility] [address] [place], [place]  [address] Phone - [phone]   Fax - [phone]  [facility] [address] [place], [place]  [address] Phone - [phone]   Fax - [phone]  [facility] [address] [place], [place]  [address] Phone - [phone]   Fax - [phone]  [facility] [address] [place], [place]  [address] Phone - [phone]   Fax - [phone]  [facility] [address] [place], [place]  [address] Phone - [phone]   Fax - [phone] [facility] [address] [place], [place]  [address] Phone - [phone]   Fax - [phone]  [facility] [address] [place], [place]  [address] Phone -  [phone]   Fax - [phone]  [facility] [address] [place], [place]  [address] Phone - [phone]   Fax - [phone]  [facility] [address], [address] [place], [place]  [address] Phone - [phone]   Fax - [phone]  SHIL[facility] GOSRANI [address] [place], [place]  [address] Phone - [phone]  [facility] [address] [place], [place]  [address] Phone - [phone]   Fax - [phone]  [facility] [address] [place], [place]  [address] Phone - [phone]   Fax - [phone]  [facility] [address] [place], [place]  [address] Phone - [phone]   Fax - [phone]  [facility] [address] [place], [place]  [address] Phone - [phone]   Fax - [phone]   [facility] [address] [place], [place]  [address] Phone - [phone]   Fax - [phone]  [facility] [address] [place], [place]  [address] Phone - [phone]   Fax - [phone]  [facility]VID RUB[facility] [address] [place], [place]  [address] Phone - [phone]   Fax - [phone]  [facility] [address] , [place]  [address] Phone - [phone]   Fax - [phone]  [facility] [address] [place], [place]  [address] Phone - [phone]   Fax - [phone] [facility] [place] [address] [place], [place]  [address] Phone - [phone]   Fax - [phone]  [facility] [address] [place], [place]  [address] Phone - [phone]   Fax - [phone]  [facility] [address] [place], [place]  [address] Phone - [phone]   Fax - [phone]  [facility] [place] Charlene Flemming MD [address] [place] [address] Phone [phone]  Fax [phone]     

## 2018-02-11 NOTE — Progress Notes (Signed)
Pt states has had a lot of swelling in feet & pain in wrist, does not elevate feet.

## 2018-02-13 ENCOUNTER — Other Ambulatory Visit (HOSPITAL_COMMUNITY): Payer: Self-pay | Admitting: *Deleted

## 2018-02-13 ENCOUNTER — Encounter (HOSPITAL_COMMUNITY): Payer: Self-pay

## 2018-02-13 ENCOUNTER — Ambulatory Visit (HOSPITAL_COMMUNITY)
Admission: RE | Admit: 2018-02-13 | Discharge: 2018-02-13 | Disposition: A | Discharge status: Home / Self Care | Payer: Medicaid Other | Source: Ambulatory Visit | Attending: Nurse Practitioner | Admitting: Nurse Practitioner

## 2018-02-13 DIAGNOSIS — Z3480 Encounter for supervision of other normal pregnancy, unspecified trimester: Secondary | ICD-10-CM | POA: Diagnosis not present

## 2018-02-13 DIAGNOSIS — O99210 Obesity complicating pregnancy, unspecified trimester: Secondary | ICD-10-CM

## 2018-02-13 DIAGNOSIS — O99212 Obesity complicating pregnancy, second trimester: Secondary | ICD-10-CM | POA: Diagnosis not present

## 2018-02-13 DIAGNOSIS — G40909 Epilepsy, unspecified, not intractable, without status epilepticus: Secondary | ICD-10-CM | POA: Diagnosis not present

## 2018-02-13 DIAGNOSIS — Z3A18 18 weeks gestation of pregnancy: Secondary | ICD-10-CM | POA: Diagnosis not present

## 2018-02-13 DIAGNOSIS — O99350 Diseases of the nervous system complicating pregnancy, unspecified trimester: Secondary | ICD-10-CM

## 2018-02-18 ENCOUNTER — Encounter: Payer: Self-pay | Admitting: Nurse Practitioner

## 2018-02-18 DIAGNOSIS — O358XX0 Maternal care for other (suspected) fetal abnormality and damage, not applicable or unspecified: Secondary | ICD-10-CM | POA: Insufficient documentation

## 2018-02-18 DIAGNOSIS — IMO0002 Reserved for concepts with insufficient information to code with codable children: Secondary | ICD-10-CM | POA: Insufficient documentation

## 2018-03-10 ENCOUNTER — Other Ambulatory Visit: Payer: Self-pay

## 2018-03-10 ENCOUNTER — Inpatient Hospital Stay (HOSPITAL_COMMUNITY)
Admission: EM | Admit: 2018-03-10 | Discharge: 2018-03-10 | Disposition: A | Discharge status: Home / Self Care | Payer: Medicaid Other | Attending: Obstetrics & Gynecology | Admitting: Obstetrics & Gynecology

## 2018-03-10 ENCOUNTER — Encounter (HOSPITAL_COMMUNITY): Payer: Self-pay

## 2018-03-10 DIAGNOSIS — Z3A22 22 weeks gestation of pregnancy: Secondary | ICD-10-CM | POA: Insufficient documentation

## 2018-03-10 DIAGNOSIS — Z87891 Personal history of nicotine dependence: Secondary | ICD-10-CM | POA: Insufficient documentation

## 2018-03-10 DIAGNOSIS — O212 Late vomiting of pregnancy: Secondary | ICD-10-CM | POA: Diagnosis present

## 2018-03-10 DIAGNOSIS — R112 Nausea with vomiting, unspecified: Secondary | ICD-10-CM

## 2018-03-10 DIAGNOSIS — J101 Influenza due to other identified influenza virus with other respiratory manifestations: Secondary | ICD-10-CM

## 2018-03-10 DIAGNOSIS — O99512 Diseases of the respiratory system complicating pregnancy, second trimester: Secondary | ICD-10-CM | POA: Diagnosis not present

## 2018-03-10 LAB — URINALYSIS, ROUTINE W REFLEX MICROSCOPIC
Bilirubin Urine: NEGATIVE
GLUCOSE, UA: NEGATIVE mg/dL
Hgb urine dipstick: NEGATIVE
Ketones, ur: 80 mg/dL — AB
LEUKOCYTE UA: NEGATIVE
Nitrite: NEGATIVE
PH: 8 (ref 5.0–8.0)
Protein, ur: NEGATIVE mg/dL
Specific Gravity, Urine: 1.019 (ref 1.005–1.030)

## 2018-03-10 LAB — CBC
HCT: 38.5 % (ref 36.0–46.0)
Hemoglobin: 12.7 g/dL (ref 12.0–15.0)
MCH: 27 pg (ref 26.0–34.0)
MCHC: 33 g/dL (ref 30.0–36.0)
MCV: 81.9 fL (ref 80.0–100.0)
Platelets: 282 10*3/uL (ref 150–400)
RBC: 4.7 MIL/uL (ref 3.87–5.11)
RDW: 14.8 % (ref 11.5–15.5)
WBC: 7.4 10*3/uL (ref 4.0–10.5)
nRBC: 0 % (ref 0.0–0.2)

## 2018-03-10 LAB — COMPREHENSIVE METABOLIC PANEL
ALT: 21 U/L (ref 0–44)
AST: 45 U/L — ABNORMAL HIGH (ref 15–41)
Albumin: 3 g/dL — ABNORMAL LOW (ref 3.5–5.0)
Alkaline Phosphatase: 76 U/L (ref 38–126)
Anion gap: 12 (ref 5–15)
BUN: <5 mg/dL — ABNORMAL LOW (ref 6–20)
CHLORIDE: 102 mmol/L (ref 98–111)
CO2: 18 mmol/L — ABNORMAL LOW (ref 22–32)
CREATININE: 0.63 mg/dL (ref 0.44–1.00)
Calcium: 8.9 mg/dL (ref 8.9–10.3)
GFR calc Af Amer: >60 mL/min (ref >60)
GFR calc non Af Amer: >60 mL/min (ref >60)
Glucose, Bld: 67 mg/dL — ABNORMAL LOW (ref 70–99)
Potassium: 4.3 mmol/L (ref 3.5–5.1)
Sodium: 132 mmol/L — ABNORMAL LOW (ref 135–145)
Total Bilirubin: 1 mg/dL (ref 0.3–1.2)
Total Protein: 6.2 g/dL — ABNORMAL LOW (ref 6.5–8.1)

## 2018-03-10 LAB — GROUP A STREP BY PCR: Group A Strep by PCR: NOT DETECTED

## 2018-03-10 LAB — WET PREP, GENITAL
Clue Cells Wet Prep HPF POC: NONE SEEN
Sperm: NONE SEEN
Trich, Wet Prep: NONE SEEN
Yeast Wet Prep HPF POC: NONE SEEN

## 2018-03-10 LAB — INFLUENZA PANEL BY PCR (TYPE A & B)
Influenza A By PCR: NEGATIVE
Influenza B By PCR: POSITIVE — AB

## 2018-03-10 MED ORDER — OSELTAMIVIR PHOSPHATE 75 MG PO CAPS: 75.0000 mg | ORAL_CAPSULE | Freq: Two times a day (BID) | ORAL | 0 refills | Status: DC

## 2018-03-10 MED ORDER — PROMETHAZINE HCL 25 MG PO TABS: 25.0000 mg | ORAL_TABLET | Freq: Four times a day (QID) | ORAL | 0 refills | Status: DC | PRN

## 2018-03-10 MED ORDER — LACTATED RINGERS IV BOLUS
1000.0000 mL | Freq: Once | INTRAVENOUS | Status: AC
  Administered 2018-03-10: 1000 mL via INTRAVENOUS

## 2018-03-10 MED ORDER — PROMETHAZINE HCL 25 MG/ML IJ SOLN
25.0000 mg | Freq: Four times a day (QID) | INTRAMUSCULAR | Status: DC | PRN
  Administered 2018-03-10: 25 mg via INTRAVENOUS
  Filled 2018-03-10: qty 1

## 2018-03-10 MED ORDER — ACETAMINOPHEN 500 MG PO TABS
1000.0000 mg | ORAL_TABLET | Freq: Four times a day (QID) | ORAL | Status: DC | PRN
  Administered 2018-03-10: 1000 mg via ORAL
  Filled 2018-03-10: qty 2

## 2018-03-10 NOTE — MAU Provider Note (Signed)
History     [name]: [id number]  Arrival date and time: 03/10/18 1441   First Provider Initiated Contact with Patient 03/10/18 1630      Chief Complaint  Patient presents with  . Emesis  . Nausea   G2P0010 .2 wks presenting with cough and N/V. N/V has been ongoing for weeks. Cannot tolerate anything po. No weight loss. Has not used anything for it. No diarrhea. Felt feverish yesterday and today, did not check temp. Cough started 4 days ago. Cough is productive. No sore throat. Having body aches. No sick contacts. Denies VB, LOF, ctx. +FM.    OB History    Gravida  2   Para      Term      Preterm      AB  1   Living        SAB  1   TAB      Ectopic      Multiple      Live Births              Past Medical History:  Diagnosis Date  . Asthma   . Chlamydia   . Gonorrhea   . PCOS (polycystic ovarian syndrome)   . Seizures (HCC)    Last seizure in 2017  . Trichomonas infection     Past Surgical History:  Procedure Laterality Date  . ADENOIDECTOMY    . ROOT CANAL    . TONSILLECTOMY    . WISDOM TOOTH EXTRACTION      Family History  Problem Relation Age of Onset  . Asthma Mother   . Heart disease Father     Social History   Tobacco Use  . Smoking status: Former Smoker    Types: Cigarettes    Last attempt to quit: 10/03/2017    Years since quitting: 0.4  . Smokeless tobacco: Never Used  Substance Use Topics  . Alcohol use: Not Currently    Comment: Social  . Drug use: Not Currently    Types: Marijuana    Comment: Last marijuana 10/03/17    Allergies:  Allergies  Allergen Reactions  . Latex Hives and Swelling  . Red Dye Nausea And Vomiting and Other (See Comments)    Her body rejects it    Medications Prior to Admission  Medication Sig Dispense Refill Last Dose  . polyethylene glycol powder (GLYCOLAX/MIRALAX) powder Take 17 g by mouth daily as needed for moderate constipation. 500 g 2 Past Month at Unknown time  . Prenatal Vit-Fe  Phos-FA-Omega (VITAFOL GUMMIES) 3.33-0.333-34.8 MG CHEW Chew 3 each by mouth daily. 90 tablet 11 03/10/2018 at Unknown time  . [DISCONTINUED] promethazine (PHENERGAN) 25 MG tablet Take 1 tablet (25 mg total) by mouth every 6 (six) hours as needed for nausea or vomiting. 30 tablet 3 03/10/2018 at Unknown time  . metroNIDAZOLE (FLAGYL) 500 MG tablet Take 1 tablet (500 mg total) by mouth 2 (two) times daily. (Patient not taking: Reported on 01/13/2018) 14 tablet 0 Not Taking  . Prenatal Vit-Fe Fumarate-FA (PRENATAL MULTIVITAMIN) TABS tablet Take 1 tablet by mouth daily at 12 noon.   Taking    Review of Systems  Constitutional: Positive for fever.  HENT: Positive for congestion and sore throat.   Respiratory: Positive for cough, chest tightness and shortness of breath.   Gastrointestinal: Positive for nausea and vomiting. Negative for abdominal pain.  Genitourinary: Negative for vaginal bleeding.  Musculoskeletal: Positive for myalgias.   Physical Exam   Blood pressure 106/70, pulse Marland Kitchen)  132, temperature 99.7 F (37.6 C), resp. rate 20, weight 123 kg, last menstrual period 08/26/2017, SpO2 98 %.  Physical Exam  Nursing note and vitals reviewed. Constitutional: She is oriented to person, place, and time. She appears well-developed and well-nourished. No distress.  HENT:  Head: Normocephalic and atraumatic.  Mouth/Throat: Uvula is midline and mucous membranes are normal. Posterior oropharyngeal erythema present.  Neck: Normal range of motion.  Cardiovascular: Regular rhythm and normal heart sounds.  tachy  Respiratory: Effort normal and breath sounds normal. No respiratory distress. She has no wheezes. She has no rales.  Musculoskeletal: Normal range of motion.  Neurological: She is alert and oriented to person, place, and time.  Skin: Skin is warm and dry.  Psychiatric: She has a normal mood and affect.  FHT 165  Results for orders placed or performed during the hospital encounter of  03/10/18 (from the past 24 hour(s))  Influenza panel by PCR (type A & B)     Status: Abnormal   Collection Time: 03/10/18  4:58 PM  Result Value Ref Range   Influenza A By PCR NEGATIVE NEGATIVE   Influenza B By PCR POSITIVE (A) NEGATIVE  Wet prep, genital     Status: Abnormal   Collection Time: 03/10/18  4:58 PM  Result Value Ref Range   Yeast Wet Prep HPF POC NONE SEEN NONE SEEN   Trich, Wet Prep NONE SEEN NONE SEEN   Clue Cells Wet Prep HPF POC NONE SEEN NONE SEEN   WBC, Wet Prep HPF POC MODERATE (A) NONE SEEN   Sperm NONE SEEN   Group A Strep by PCR     Status: None   Collection Time: 03/10/18  5:30 PM  Result Value Ref Range   Group A Strep by PCR NOT DETECTED NOT DETECTED  CBC     Status: None   Collection Time: 03/10/18  5:31 PM  Result Value Ref Range   WBC 7.4 4.0 - 10.5 K/uL   RBC 4.70 3.87 - 5.11 MIL/uL   Hemoglobin 12.7 12.0 - 15.0 g/dL   HCT 19.1 66.0 - 60.0 %   MCV 81.9 80.0 - 100.0 fL   MCH 27.0 26.0 - 34.0 pg   MCHC 33.0 30.0 - 36.0 g/dL   RDW 45.9 97.7 - 41.4 %   Platelets 282 150 - 400 K/uL   nRBC 0.0 0.0 - 0.2 %  Comprehensive metabolic panel     Status: Abnormal   Collection Time: 03/10/18  5:31 PM  Result Value Ref Range   Sodium 132 (L) 135 - 145 mmol/L   Potassium 4.3 3.5 - 5.1 mmol/L   Chloride 102 98 - 111 mmol/L   CO2 18 (L) 22 - 32 mmol/L   Glucose, Bld 67 (L) 70 - 99 mg/dL   BUN <5 (L) 6 - 20 mg/dL   Creatinine, Ser 2.39 0.44 - 1.00 mg/dL   Calcium 8.9 8.9 - 53.2 mg/dL   Total Protein 6.2 (L) 6.5 - 8.1 g/dL   Albumin 3.0 (L) 3.5 - 5.0 g/dL   AST 45 (H) 15 - 41 U/L   ALT 21 0 - 44 U/L   Alkaline Phosphatase 76 38 - 126 U/L   Total Bilirubin 1.0 0.3 - 1.2 mg/dL   GFR calc non Af Amer >60 >60 mL/min   GFR calc Af Amer >60 >60 mL/min   Anion gap 12 5 - 15  Urinalysis, Routine w reflex microscopic     Status: Abnormal   Collection Time:  03/10/18  6:20 PM  Result Value Ref Range   Color, Urine AMBER (A) YELLOW   APPearance CLOUDY (A) CLEAR    Specific Gravity, Urine 1.019 1.005 - 1.030   pH 8.0 5.0 - 8.0   Glucose, UA NEGATIVE NEGATIVE mg/dL   Hgb urine dipstick NEGATIVE NEGATIVE   Bilirubin Urine NEGATIVE NEGATIVE   Ketones, ur 80 (A) NEGATIVE mg/dL   Protein, ur NEGATIVE NEGATIVE mg/dL   Nitrite NEGATIVE NEGATIVE   Leukocytes,Ua NEGATIVE NEGATIVE   RBC / HPF 0-5 0 - 5 RBC/hpf   Bacteria, UA MANY (A) NONE SEEN   Squamous Epithelial / LPF 0-5 0 - 5   Amorphous Crystal PRESENT    MAU Course  Procedures Orders Placed This Encounter  Procedures  . Wet prep, genital    Standing Status:   Standing    Number of Occurrences:   1    Order Specific Question:   Patient immune status    Answer:   Normal  . Group A Strep by PCR    Standing Status:   Standing    Number of Occurrences:   1  . Urinalysis, Routine w reflex microscopic    Standing Status:   Standing    Number of Occurrences:   1  . Influenza panel by PCR (type A & B)    Standing Status:   Standing    Number of Occurrences:   1  . CBC    Standing Status:   Standing    Number of Occurrences:   1  . Comprehensive metabolic panel    Standing Status:   Standing    Number of Occurrences:   1  . Droplet precaution    Standing Status:   Standing    Number of Occurrences:   1  . Discharge patient    Order Specific Question:   Discharge disposition    Answer:   01-Home or Self Care [1]    Order Specific Question:   Discharge patient date    Answer:   03/10/2018   Meds ordered this encounter  Medications  . lactated ringers bolus 1,000 mL  . promethazine (PHENERGAN) injection 25 mg  . acetaminophen (TYLENOL) tablet 1,000 mg  . promethazine (PHENERGAN) 25 MG tablet    Sig: Take 1 tablet (25 mg total) by mouth every 6 (six) hours as needed for nausea or vomiting.    Dispense:  30 tablet    Refill:  0    Order Specific Question:   Supervising Provider    Answer:   Adam Phenix [[id number]]  . oseltamivir (TAMIFLU) 75 MG capsule    Sig: Take 1 capsule (75 mg  total) by mouth 2 (two) times daily.    Dispense:  10 capsule    Refill:  0    Order Specific Question:   Supervising Provider    Answer:   Adam Phenix [[id number]]   MDM Labs ordered and reviewed. Flu positive. Will treat with Tamiflu. Feeling better after IVF and Phenergan. Tolerating po, no emesis. Stable for discharge home.   Assessment and Plan   1. [redacted] weeks gestation of pregnancy   2. Influenza B   3. Non-intractable vomiting with nausea, unspecified vomiting type    Discharge home Follow up at [facility] in 1 week- needs to schedule appt Rx Tamiflu Rx Phenergan  Allergies as of 03/10/2018      Reactions   Latex Hives, Swelling   Red Dye Nausea And Vomiting, Other (See Comments)  Her body rejects it      Medication List    STOP taking these medications   metroNIDAZOLE 500 MG tablet Commonly known as:  FLAGYL     TAKE these medications   oseltamivir 75 MG capsule Commonly known as:  TAMIFLU Take 1 capsule (75 mg total) by mouth 2 (two) times daily.   polyethylene glycol powder powder Commonly known as:  GLYCOLAX/MIRALAX Take 17 g by mouth daily as needed for moderate constipation.   prenatal multivitamin Tabs tablet Take 1 tablet by mouth daily at 12 noon.   promethazine 25 MG tablet Commonly known as:  PHENERGAN Take 1 tablet (25 mg total) by mouth every 6 (six) hours as needed for nausea or vomiting.   VITAFOL GUMMIES 3.33-0.333-34.8 MG Chew Chew 3 each by mouth daily.      Donette Larry, CNM 03/10/2018, 7:56 PM

## 2018-03-10 NOTE — Discharge Instructions (Signed)
Pregnancy and Influenza    Influenza, also called the flu, is an infection of the lungs and airways (respiratory tract). If you are pregnant, you are more likely to catch the flu. You are also more likely to have a more serious case of the flu. This is because pregnancy causes changes to your body's disease-fighting system (immune system), heart, and lungs. If you develop a bad case of the flu, especially with a high fever, this can cause problems for you and your developing baby.  How do people get the flu?  The flu is caused by a type of germ called a virus. It spreads when virus particles get passed from person to person by:   Being near a sick person who is coughing or sneezing.   Touching something that has the virus on it and then touching your mouth, nose, or face.  The influenza virus is most common during the fall and winter.  How can I protect myself against the flu?   Get a flu shot. The best way to prevent the flu is to get a flu shot before flu season starts. The flu shot is not dangerous for your developing baby. It may even help protect your baby from the flu for up to 6 months after birth.   Wash your hands often with soap and warm water. If soap and water are not available, use hand sanitizer.   Do not come in close contact with sick people.   Do not share food, drinks, or utensils with other people.   Avoid touching your eyes, nose, and mouth.   Clean frequently used surfaces at home, school, or work.   Practice healthy lifestyle habits, such as:  ? Eating a healthy, balanced diet.  ? Drinking plenty of fluids.  ? Exercising regularly or as told by your health care provider.  ? Sleeping 7-9 hours each night.  ? Finding ways to manage stress.  What should I do if I have flu symptoms?   If you have any symptoms of the flu, even after getting a flu shot, contact your health care provider right away.   To reduce fever, take over-the-counter acetaminophen as told by your health care  provider.   If you have the flu, you may get antiviral medicine to keep the flu from becoming severe and to shorten how long it lasts.   Avoid spreading the flu to others:  ? Stay home until you are well.  ? Cover your nose and mouth when you cough or sneeze.  ? Wash your hands often.  Follow these instructions at home:   Take over-the-counter and prescription medicines only as told by your health care provider. Do not take any medicine, including cold or flu medicine, unless your health care provider tells you to do so.   If you were prescribed antiviral medicine, take it as told by your health care provider. Do not stop taking the antiviral medicine even if you start to feel better.   Eat a nutrient-rich diet that includes fresh fruits and vegetables, whole grains, lean protein, and low-fat dairy.   Drink enough fluid to keep your urine clear or pale yellow.   Get plenty of rest.  Contact a health care provider if:   You have fever or chills.   You have a cough, sore throat, or stuffy nose.   You have worsening or unusual:  ? Muscle aches.  ? Headache.  ? Tiredness.  ? Loss of appetite.   You   have vomiting or diarrhea.  Get help right away if:   You have trouble breathing.   You have chest pain.   You have abdominal pain.   You begin to have labor pains.   You have a fever that does not go down 24 hours after you take medicine.   You do not feel your baby move.   You have diarrhea or vomiting that will not go away.   You have dizziness or confusion.   Your symptoms do not improve, even with treatment.  Summary   If you are pregnant, you are more likely to catch the flu. You are also more likely to have a more serious case of the flu.   If you have flu-like symptoms, call your health care provider right away. If you develop a bad case of the flu, especially with a high fever, this can be dangerous for your developing baby.   The best way to prevent the flu is to get a flu shot before flu  season starts. The flu shot is not dangerous for your developing baby.   If you have the flu and were prescribed antiviral medicine, take it as told by your health care provider.  This information is not intended to replace advice given to you by your health care provider. Make sure you discuss any questions you have with your health care provider.  Document Released: 11/03/2007 Document Revised: 02/27/2016 Document Reviewed: 02/27/2016  [employer]vier Interactive Patient Education  2019 [employer]

## 2018-03-10 NOTE — MAU Note (Signed)
Pt states that she has been having problems with her asthma and bronchitis because she does not have an inhaler.   Pt states she has been nauseated the last two weeks.   Pt feeling drained and dehydrated she states she has not been able to keep water down this week.   Pt states when she wiped this morning she had a creamy blood like discharge one time that has not continued.

## 2018-03-11 ENCOUNTER — Telehealth: Payer: Self-pay | Admitting: Family Medicine

## 2018-03-11 NOTE — Telephone Encounter (Signed)
Attempted to call patient with her ROB appt. No answer, lvm with appt and advised to give the office a call if needing to reschedule

## 2018-03-13 ENCOUNTER — Ambulatory Visit (HOSPITAL_COMMUNITY): Payer: Medicaid Other | Admitting: *Deleted

## 2018-03-13 ENCOUNTER — Encounter (HOSPITAL_COMMUNITY): Payer: Self-pay | Admitting: *Deleted

## 2018-03-13 ENCOUNTER — Ambulatory Visit (HOSPITAL_COMMUNITY)
Admission: RE | Admit: 2018-03-13 | Discharge: 2018-03-13 | Disposition: A | Discharge status: Home / Self Care | Payer: Medicaid Other | Source: Ambulatory Visit | Attending: Nurse Practitioner | Admitting: Nurse Practitioner

## 2018-03-13 DIAGNOSIS — O99210 Obesity complicating pregnancy, unspecified trimester: Secondary | ICD-10-CM

## 2018-03-13 DIAGNOSIS — Z3480 Encounter for supervision of other normal pregnancy, unspecified trimester: Secondary | ICD-10-CM

## 2018-03-13 LAB — GC/CHLAMYDIA PROBE AMP (~~LOC~~) NOT AT ARMC
Chlamydia: NEGATIVE
Neisseria Gonorrhea: NEGATIVE

## 2018-03-13 NOTE — Progress Notes (Signed)
Rescheduled pt F/U in MFM due to pos. Flu and not fever free without tylenol until today.  Placed mask on pt upon arrival.  Discussed [place] policy.  Pt verbalized understanding and rescheduled for Wed of next week. 

## 2018-03-13 NOTE — Progress Notes (Signed)
Rescheduled pt F/U in MFM due to pos. Flu and not fever free without tylenol until today.  Placed mask on pt upon arrival.  Discussed Chloride policy.  Pt verbalized understanding and rescheduled for Wed of next week.

## 2018-03-16 ENCOUNTER — Encounter: Payer: Self-pay | Admitting: Obstetrics & Gynecology

## 2018-03-18 ENCOUNTER — Other Ambulatory Visit: Payer: Self-pay

## 2018-03-18 ENCOUNTER — Encounter: Payer: Self-pay | Admitting: Medical

## 2018-03-18 ENCOUNTER — Ambulatory Visit (INDEPENDENT_AMBULATORY_CARE_PROVIDER_SITE_OTHER): Payer: Medicaid Other | Admitting: Medical

## 2018-03-18 VITALS — BP 118/69 | HR 102 | Wt 269.6 lb

## 2018-03-18 DIAGNOSIS — B951 Streptococcus, group B, as the cause of diseases classified elsewhere: Secondary | ICD-10-CM

## 2018-03-18 DIAGNOSIS — O99210 Obesity complicating pregnancy, unspecified trimester: Secondary | ICD-10-CM | POA: Insufficient documentation

## 2018-03-18 DIAGNOSIS — O98819 Other maternal infectious and parasitic diseases complicating pregnancy, unspecified trimester: Secondary | ICD-10-CM

## 2018-03-18 DIAGNOSIS — O99212 Obesity complicating pregnancy, second trimester: Secondary | ICD-10-CM

## 2018-03-18 DIAGNOSIS — Z3480 Encounter for supervision of other normal pregnancy, unspecified trimester: Secondary | ICD-10-CM

## 2018-03-18 DIAGNOSIS — O98812 Other maternal infectious and parasitic diseases complicating pregnancy, second trimester: Secondary | ICD-10-CM

## 2018-03-18 DIAGNOSIS — Z3A23 23 weeks gestation of pregnancy: Secondary | ICD-10-CM

## 2018-03-18 NOTE — Patient Instructions (Addendum)
Second Trimester of Pregnancy  The second trimester is from week 14 through week 27 (month 4 through 6). This is often the time in pregnancy that you feel your best. Often times, morning sickness has lessened or quit. You may have more energy, and you may get hungry more often. Your unborn baby is growing rapidly. At the end of the sixth month, he or she is about 9 inches long and weighs about 1 pounds. You will likely feel the baby move between 18 and 20 weeks of pregnancy. Follow these instructions at home: Medicines  Take over-the-counter and prescription medicines only as told by your doctor. Some medicines are safe and some medicines are not safe during pregnancy.  Take a prenatal vitamin that contains at least 600 micrograms (mcg) of folic acid.  If you have trouble pooping (constipation), take medicine that will make your stool soft (stool softener) if your doctor approves. Eating and drinking   Eat regular, healthy meals.  Avoid raw meat and uncooked cheese.  If you get low calcium from the food you eat, talk to your doctor about taking a daily calcium supplement.  Avoid foods that are high in fat and sugars, such as fried and sweet foods.  If you feel sick to your stomach (nauseous) or throw up (vomit): ? Eat 4 or 5 small meals a day instead of 3 large meals. ? Try eating a few soda crackers. ? Drink liquids between meals instead of during meals.  To prevent constipation: ? Eat foods that are high in fiber, like fresh fruits and vegetables, whole grains, and beans. ? Drink enough fluids to keep your pee (urine) clear or pale yellow. Activity  Exercise only as told by your doctor. Stop exercising if you start to have cramps.  Do not exercise if it is too hot, too humid, or if you are in a place of great height (high altitude).  Avoid heavy lifting.  Wear low-heeled shoes. Sit and stand up straight.  You can continue to have sex unless your doctor tells you not  to. Relieving pain and discomfort  Wear a good support bra if your breasts are tender.  Take warm water baths (sitz baths) to soothe pain or discomfort caused by hemorrhoids. Use hemorrhoid cream if your doctor approves.  Rest with your legs raised if you have leg cramps or low back pain.  If you develop puffy, bulging veins (varicose veins) in your legs: ? Wear support hose or compression stockings as told by your doctor. ? Raise (elevate) your feet for 15 minutes, 3-4 times a day. ? Limit salt in your food. Prenatal care  Write down your questions. Take them to your prenatal visits.  Keep all your prenatal visits as told by your doctor. This is important. Safety  Wear your seat belt when driving.  Make a list of emergency phone numbers, including numbers for family, friends, the hospital, and police and fire departments. General instructions  Ask your doctor about the right foods to eat or for help finding a counselor, if you need these services.  Ask your doctor about local prenatal classes. Begin classes before month 6 of your pregnancy.  Do not use hot tubs, steam rooms, or saunas.  Do not douche or use tampons or scented sanitary pads.  Do not cross your legs for long periods of time.  Visit your dentist if you have not done so. Use a soft toothbrush to brush your teeth. Floss gently.  Avoid all smoking, herbs,   and alcohol. Avoid drugs that are not approved by your doctor.  Do not use any products that contain nicotine or tobacco, such as cigarettes and e-cigarettes. If you need help quitting, ask your doctor.  Avoid cat litter boxes and soil used by cats. These carry germs that can cause birth defects in the baby and can cause a loss of your baby (miscarriage) or stillbirth. Contact a doctor if:  You have mild cramps or pressure in your lower belly.  You have pain when you pee (urinate).  You have bad smelling fluid coming from your vagina.  You continue to  feel sick to your stomach (nauseous), throw up (vomit), or have watery poop (diarrhea).  You have a nagging pain in your belly area.  You feel dizzy. Get help right away if:  You have a fever.  You are leaking fluid from your vagina.  You have spotting or bleeding from your vagina.  You have severe belly cramping or pain.  You lose or gain weight rapidly.  You have trouble catching your breath and have chest pain.  You notice sudden or extreme puffiness (swelling) of your face, hands, ankles, feet, or legs.  You have not felt the baby move in over an hour.  You have severe headaches that do not go away when you take medicine.  You have trouble seeing. Summary  The second trimester is from week 14 through week 27 (months 4 through 6). This is often the time in pregnancy that you feel your best.  To take care of yourself and your unborn baby, you will need to eat healthy meals, take medicines only if your doctor tells you to do so, and do activities that are safe for you and your baby.  Call your doctor if you get sick or if you notice anything unusual about your pregnancy. Also, call your doctor if you need help with the right food to eat, or if you want to know what activities are safe for you. This information is not intended to replace advice given to you by your health care provider. Make sure you discuss any questions you have with your health care provider. Document Released: 03/27/2009 Document Revised: 02/06/2016 Document Reviewed: 02/06/2016 Elsevier Interactive Patient Education  2019 [place] Medications in Pregnancy   Acne:  Benzoyl Peroxide  Salicylic Acid   Backache/Headache:  Tylenol: 2 regular strength every 4 hours OR        2 Extra strength every 6 hours   Colds/Coughs/Allergies:  Benadryl (alcohol free) 25 mg every 6 hours as needed  Breath right strips  Claritin  Cepacol throat lozenges  Chloraseptic throat spray  Cold-Eeze-  up to three times per day  Cough drops, alcohol free  Flonase (by prescription only)  Guaifenesin  Mucinex  Robitussin DM (plain only, alcohol free)  Saline nasal spray/drops  Sudafed (pseudoephedrine) & Actifed * use only after [redacted] weeks gestation and if you do not have high blood pressure  Tylenol  Vicks Vaporub  Zinc lozenges  Zyrtec   Constipation:  Colace  Ducolax suppositories  Fleet enema  Glycerin suppositories  Metamucil  Milk of magnesia  Miralax  Senokot  Smooth move tea   Diarrhea:  Kaopectate  Imodium A-D   *NO pepto Bismol   Hemorrhoids:  Anusol  Anusol HC  Preparation H  Tucks   Indigestion:  Tums  Maalox  Mylanta  Zantac  Pepcid   Insomnia:  Benadryl (alcohol free) 41m every 6 hours as  needed  Tylenol PM  Unisom, no Gelcaps   Leg Cramps:  Tums  MagGel   Nausea/Vomiting:  Bonine  Dramamine  Emetrol  Ginger extract  Sea bands  Meclizine  Nausea medication to take during pregnancy:  Unisom (doxylamine succinate 25 mg tablets) Take one tablet daily at bedtime. If symptoms are not adequately controlled, the dose can be increased to a maximum recommended dose of two tablets daily (1/2 tablet in the morning, 1/2 tablet mid-afternoon and one at bedtime).  Vitamin B6 171m tablets. Take one tablet twice a day (up to 200 mg per day).   Skin Rashes:  Aveeno products  Benadryl cream or 258mevery 6 hours as needed  Calamine Lotion  1% cortisone cream   Yeast infection:  Gyne-lotrimin 7  Monistat 7    **If taking multiple medications, please check labels to avoid duplicating the same active ingredients  **take medication as directed on the label  ** Do not exceed 4000 mg of tylenol in 24 hours  **Do not take medications that contain aspirin or ibuprofen         Considering Waterbirth? Guide for patients at [facility] for [facility] [facility]Why consider waterbirth?  . Gentle birth for babies . Less pain medicine used in  labor . May allow for passive descent/less pushing . May reduce perineal tears  . More mobility and instinctive maternal position changes . Increased maternal relaxation . Reduced blood pressure in labor  Is waterbirth safe? What are the risks of infection, drowning or other complications?  . Infection: o Very low risk (3.7 % for tub vs 4.8% for bed) o 7 in 8000 waterbirths with documented infection o Poorly cleaned equipment most common cause o Slightly lower group B strep transmission rate  . Drowning o Maternal:  - Very low risk   - Related to seizures or fainting o Newborn:  - Very low risk. No evidence of increased risk of respiratory problems in multiple large studies - Physiological protection from breathing under water - Avoid underwater birth if there are any fetal complications - Once baby's head is out of the water, keep it out.  . Birth complication o Some reports of cord trauma, but risk decreased by bringing baby to surface gradually o No evidence of increased risk of shoulder dystocia. Mothers can usually change positions faster in water than in a bed, possibly aiding the maneuvers to free the shoulder.   You must attend a [facility] 7-9pm  FrHar[employer]sony calling [phone] online at [email or web address] certificate from the class to your prenatal appointment  Meet with a midwife at 36 weeks to see if you can still plan a waterbirth and to sign the consent.   If [facility] 2018[facility] will [facility] the following:  Fish net  Bathing suit top (optional)  Long-handled mirror (optional)  If you plan a waterbirth before March 08, 2018: Purchase or rent the following supplies: You are responsible for providing all supplies listed above. **If you do not have all necessary supplies  you cannot have a waterbirth.**   Water Birth Pool (Birth Pool in a Box or [place] instance)  (Tubs start ~$125)  Single-use disposable tub liner designed for your brand of tub  Electric drain pump to remove water (We recommend 792 gallon per hour  or greater pump.)   New garden hose labeled "lead-free", "suitable for drinking water",  Separate garden hose to remove the dirty water  Fish net  Bathing suit top (optional)  Long-handled mirror (optional)  Places to purchase or rent supplies:   [employer].is for tub purchases and supplies  [employer]com for tub purchases and supplies  [employer] (www.thelaborladies.com) $275 for tub rental/set-up & take down/kit   [employer] (http://www.fleming.com/.htm) Information regarding doulas (labor support) who provide pool rentals  Things that would prevent you from having a waterbirth:  Premature, <37wks  Previous cesarean birth  Presence of thick meconium-stained fluid  Multiple gestation (Twins, triplets, etc.)  Uncontrolled diabetes or gestational diabetes requiring medication  Hypertension requiring medication or diagnosis of pre-eclampsia  Heavy vaginal bleeding  Non-reassuring fetal heart rate  Active infection (MRSA, etc.). Group B Strep is NOT a contraindication for waterbirth.  If your labor has to be induced and induction method requires continuous monitoring of the baby's heart rate  Other risks/issues identified by your obstetrical provider  Please remember that birth is unpredictable. Under certain unforeseeable circumstances your provider may advise against giving birth in the tub. These decisions will be made on a case-by-case basis and with the safety of you and your baby as our highest priority.

## 2018-03-18 NOTE — Progress Notes (Signed)
   PRENATAL VISIT NOTE  Subjective:  Colleen Stone is a 23 y.o. G2P0010 at [redacted]w[redacted]d being seen today for ongoing prenatal care.  She is currently monitored for the following issues for this low-risk pregnancy and has Supervision of other normal pregnancy, antepartum; Bipolar 1 disorder (HCC); Atypical squamous cells of undetermined significance (ASCUS) on Papanicolaou smear of cervix; Family history of breast cancer; Other acne; Family history of schizophrenia; Smoker; Group B streptococcal infection during pregnancy; Obesity affecting pregnancy in second trimester; Fetal cardiac echogenic focus; and Obesity in pregnancy, antepartum on their problem list.  Patient reports no complaints.  Contractions: Not present. Vag. Bleeding: None.  Movement: Present. Denies leaking of fluid.   The following portions of the patient's history were reviewed and updated as appropriate: allergies, current medications, past family history, past medical history, past social history, past surgical history and problem list. Problem list updated.  Objective:   Vitals:   03/18/18 1409  BP: 118/69  Pulse: (!) 102  Weight: 269 lb 9.6 oz (122.3 kg)    Fetal Status: Fetal Heart Rate (bpm): 151   Movement: Present     General:  Alert, oriented and cooperative. Patient is in no acute distress.  Skin: Skin is warm and dry. No rash noted.   Cardiovascular: Normal heart rate noted  Respiratory: Normal respiratory effort, no problems with respiration noted  Abdomen: Soft, gravid, appropriate for gestational age.  Pain/Pressure: Absent     Pelvic: Cervical exam deferred        Extremities: Normal range of motion.  Edema: Trace  Mental Status: Normal mood and affect. Normal behavior. Normal judgment and thought content.   Assessment and Plan:  Pregnancy: G2P0010 at [redacted]w[redacted]d  1. Supervision of other normal pregnancy, antepartum - Patient is interested in waterbirth, info given, will schedule with CNM at next visit  -  Patient interested in lotus birth, advised this may not be allowed by peds, patient voiced understanding and would be ok with delayed cord clamping   2. Obesity affecting pregnancy in second trimester - Recommended ASA 81 mg daily, patient agrees  3. Group B streptococcal infection during pregnancy - Treat in labor   Preterm labor symptoms and general obstetric precautions including but not limited to vaginal bleeding, contractions, leaking of fluid and fetal movement were reviewed in detail with the patient. Please refer to After Visit Summary for other counseling recommendations.  Return in about 4 weeks (around 04/15/2018) for LOB, 28 week labs (fasting).  Future Appointments  Date Time Provider Department Center  03/23/2018 12:30 PM [facility] NURSE [facility]  03/23/2018 12:45 PM [facility] [place] 2 [facility]    Vonzella Nipple, PA-C

## 2018-03-23 ENCOUNTER — Other Ambulatory Visit (HOSPITAL_COMMUNITY): Payer: Self-pay | Admitting: *Deleted

## 2018-03-23 ENCOUNTER — Encounter (HOSPITAL_COMMUNITY): Payer: Self-pay

## 2018-03-23 ENCOUNTER — Ambulatory Visit (HOSPITAL_COMMUNITY)
Admission: RE | Admit: 2018-03-23 | Discharge: 2018-03-23 | Disposition: A | Discharge status: Home / Self Care | Payer: Medicaid Other | Source: Ambulatory Visit | Attending: Obstetrics and Gynecology | Admitting: Obstetrics and Gynecology

## 2018-03-23 ENCOUNTER — Ambulatory Visit (HOSPITAL_COMMUNITY): Payer: Medicaid Other | Admitting: *Deleted

## 2018-03-23 VITALS — BP 135/86 | HR 100 | Wt 275.0 lb

## 2018-03-23 DIAGNOSIS — O99210 Obesity complicating pregnancy, unspecified trimester: Secondary | ICD-10-CM | POA: Diagnosis present

## 2018-03-23 DIAGNOSIS — O99352 Diseases of the nervous system complicating pregnancy, second trimester: Secondary | ICD-10-CM

## 2018-03-23 DIAGNOSIS — G40909 Epilepsy, unspecified, not intractable, without status epilepticus: Secondary | ICD-10-CM

## 2018-03-23 DIAGNOSIS — Z3A24 24 weeks gestation of pregnancy: Secondary | ICD-10-CM

## 2018-03-23 DIAGNOSIS — O0990 Supervision of high risk pregnancy, unspecified, unspecified trimester: Secondary | ICD-10-CM

## 2018-03-23 DIAGNOSIS — Z362 Encounter for other antenatal screening follow-up: Secondary | ICD-10-CM

## 2018-03-23 DIAGNOSIS — O99213 Obesity complicating pregnancy, third trimester: Secondary | ICD-10-CM

## 2018-03-23 DIAGNOSIS — O99212 Obesity complicating pregnancy, second trimester: Secondary | ICD-10-CM | POA: Diagnosis not present

## 2018-04-06 ENCOUNTER — Other Ambulatory Visit: Payer: Self-pay | Admitting: *Deleted

## 2018-04-06 DIAGNOSIS — B3731 Acute candidiasis of vulva and vagina: Secondary | ICD-10-CM

## 2018-04-06 DIAGNOSIS — N898 Other specified noninflammatory disorders of vagina: Secondary | ICD-10-CM

## 2018-04-06 DIAGNOSIS — B373 Candidiasis of vulva and vagina: Secondary | ICD-10-CM

## 2018-04-06 MED ORDER — TERCONAZOLE 0.4 % VA CREA: 1.0000 | TOPICAL_CREAM | Freq: Every day | VAGINAL | 0 refills | Status: AC

## 2018-04-06 NOTE — Telephone Encounter (Signed)
Tabor left a message 04/03/18 pm after office closed requesting medicine for yeast.

## 2018-04-06 NOTE — Telephone Encounter (Signed)
I called Colleen Stone back and asked her what symptoms she is having. She states same as when she has yeast before which are vaginal itching and thick cottage cheese like vaginal discharge. I informed her I would sent prescription for terazole to her pharmacy- one applicator in vaginal at bedtime for 3 nights and to let us know if this does not relieve her symtoms. I advised her to wait a few hour and to call pharmacy first re: their hours due to coronavirus. She voices understanding.

## 2018-04-07 ENCOUNTER — Encounter: Payer: Self-pay | Admitting: *Deleted

## 2018-04-17 ENCOUNTER — Other Ambulatory Visit: Payer: Self-pay | Admitting: *Deleted

## 2018-04-17 DIAGNOSIS — Z3480 Encounter for supervision of other normal pregnancy, unspecified trimester: Secondary | ICD-10-CM

## 2018-04-20 ENCOUNTER — Encounter: Payer: Self-pay | Admitting: Advanced Practice Midwife

## 2018-04-20 ENCOUNTER — Ambulatory Visit (HOSPITAL_COMMUNITY): Payer: Self-pay

## 2018-04-20 ENCOUNTER — Other Ambulatory Visit: Payer: Self-pay

## 2018-04-20 ENCOUNTER — Ambulatory Visit (INDEPENDENT_AMBULATORY_CARE_PROVIDER_SITE_OTHER): Payer: Medicaid Other | Admitting: Advanced Practice Midwife

## 2018-04-20 ENCOUNTER — Other Ambulatory Visit: Payer: Medicaid Other

## 2018-04-20 ENCOUNTER — Ambulatory Visit (HOSPITAL_COMMUNITY): Payer: Medicaid Other

## 2018-04-20 VITALS — BP 115/86 | HR 130 | Temp 97.9°F | Wt 275.0 lb

## 2018-04-20 DIAGNOSIS — Z3480 Encounter for supervision of other normal pregnancy, unspecified trimester: Secondary | ICD-10-CM

## 2018-04-20 DIAGNOSIS — Z3403 Encounter for supervision of normal first pregnancy, third trimester: Secondary | ICD-10-CM | POA: Diagnosis present

## 2018-04-20 DIAGNOSIS — Z3A28 28 weeks gestation of pregnancy: Secondary | ICD-10-CM

## 2018-04-20 DIAGNOSIS — Z23 Encounter for immunization: Secondary | ICD-10-CM

## 2018-04-20 NOTE — Progress Notes (Signed)
   PRENATAL VISIT NOTE  Subjective:  Colleen Stone is a 23 y.o. G2P0010 at [redacted]w[redacted]d being seen today for ongoing prenatal care.  She is currently monitored for the following issues for this low-risk pregnancy and has Supervision of other normal pregnancy, antepartum; Bipolar 1 disorder (HCC); Atypical squamous cells of undetermined significance (ASCUS) on Papanicolaou smear of cervix; Family history of breast cancer; Other acne; Family history of schizophrenia; Smoker; Group B streptococcal infection during pregnancy; Obesity affecting pregnancy in second trimester; Fetal cardiac echogenic focus; and Obesity in pregnancy, antepartum on their problem list.  Patient reports no complaints.  Contractions: Not present. Vag. Bleeding: None.  Movement: Present. Denies leaking of fluid.   The following portions of the patient's history were reviewed and updated as appropriate: allergies, current medications, past family history, past medical history, past social history, past surgical history and problem list.   Objective:   Vitals:   04/20/18 0931  BP: 115/86  Pulse: (!) 130  Temp: 97.9 F (36.6 C)  Weight: 275 lb (124.7 kg)    Fetal Status: Fetal Heart Rate (bpm):  139 Fundal Height: 28 cm Movement: Present     General:  Alert, oriented and cooperative. Patient is in no acute distress.  Skin: Skin is warm and dry. No rash noted.   Cardiovascular: Normal heart rate noted  Respiratory: Normal respiratory effort, no problems with respiration noted  Abdomen: Soft, gravid, appropriate for gestational age.  Pain/Pressure: Present     Pelvic: Cervical exam deferred        Extremities: Normal range of motion.  Edema: Trace  Mental Status: Normal mood and affect. Normal behavior. Normal judgment and thought content.   Assessment and Plan:  Pregnancy: G2P0010 at [redacted]w[redacted]d 1. Supervision of other normal pregnancy, antepartum - Routine care - 28 week labs and GTT today - Tdap vaccine greater than or  equal to 7yo IM - CHL AMB BABYSCRIPTS SCHEDULE OPTIMIZATION - Will start weekly BP at home, and upload to babyscripts. Will then have next visit at 32 weeks for tele-visit    Preterm labor symptoms and general obstetric precautions including but not limited to vaginal bleeding, contractions, leaking of fluid and fetal movement were reviewed in detail with the patient. Please refer to After Visit Summary for other counseling recommendations.   Return in about 4 weeks (around 05/18/2018) for can be tele/phone visit .  Future Appointments  Date Time Provider Department Center  05/18/2018 11:15 AM [facility] NURSE [facility]  05/18/2018 11:15 AM [facility] [place] 4 [facility]   Thressa Sheller DNP, CNM  04/20/18  9:53 AM

## 2018-04-20 NOTE — Patient Instructions (Signed)
Currently all waterbirth at [facility] and [facility] has been suspended due to the COVID-19 pandemic. Please be aware that this may be subject to change, but at this time we are uncertain as to how long waterbirth will be stopped. Please continue to discuss with your provider as you get closer to your due date.    Considering Waterbirth? Guide for patients at [facility]  Why consider waterbirth?  . Gentle birth for babies . Less pain medicine used in labor . May allow for passive descent/less pushing . May reduce perineal tears  . More mobility and instinctive maternal position changes . Increased maternal relaxation . Reduced blood pressure in labor  Is waterbirth safe? What are the risks of infection, drowning or other complications?  . Infection: o Very low risk (3.7 % for tub vs 4.8% for bed) o 7 in 8000 waterbirths with documented infection o Poorly cleaned equipment most common cause o Slightly lower group B strep transmission rate  . Drowning o Maternal:  - Very low risk   - Related to seizures or fainting o Newborn:  - Very low risk. No evidence of increased risk of respiratory problems in multiple large studies - Physiological protection from breathing under water - Avoid underwater birth if there are any fetal complications - Once baby's head is out of the water, keep it out.  . Birth complication o Some reports of cord trauma, but risk decreased by bringing baby to surface gradually o No evidence of increased risk of shoulder dystocia. Mothers can usually change positions faster in water than in a bed, possibly aiding the maneuvers to free the shoulder.   You must attend a Doren Custard class at [facility]  3rd Wednesday of every month from 7-9pm  [employer] by calling [phone] or online at [email or web address]  Bring [place] the certificate from the class to your prenatal appointment  Meet with a midwife at 36 weeks to see  if you can still plan a waterbirth and to sign the consent.   If you plan a waterbirth after March 08, 2018, at [facility] at [facility], you will need to purchase the following:  Fish net  Bathing suit top (optional)  Long-handled mirror (optional)  Places to purchase or rent supplies:   [email or web address] for tub purchases and supplies  [employer].[email or web address] for tub purchases and supplies  [employer] (www.[email or web address]) $275 for tub rental/set-up & take down/kit   [employer] (http://www.fleming.[email or web address]) Information regarding doulas (labor support) who provide pool rentals  Things that would prevent you from having a waterbirth:  Premature, <37wks  Previous cesarean birth  Presence of thick meconium-stained fluid  Multiple gestation (Twins, triplets, etc.)  Uncontrolled diabetes or gestational diabetes requiring medication  Hypertension requiring medication or diagnosis of pre-eclampsia  Heavy vaginal bleeding  Non-reassuring fetal heart rate  Active infection (MRSA, etc.). Group B Strep is NOT a contraindication for waterbirth.  If your labor has to be induced and induction method requires continuous monitoring of the baby's heart rate  Other risks/issues identified by your obstetrical provider  Please remember that birth is unpredictable. Under certain unforeseeable circumstances your provider may advise against giving birth in the tub. These decisions will be made on a case-by-case basis and with the safety of you and your baby as our highest priority.  Social Distancing FAQ: How It Helps Prevent COVID-19 (Coronavirus) and Steps We Can Take to Protect Ourselves There are many things we can do  to prevent the spread of COVID-19 (coronavirus): washing our hands, coughing into our elbows, avoiding touching our faces, staying home if we're feeling sick and social distancing. But what is social  distancing? These frequently asked questions explain how to practice social distancing in order to protect yourself, your loved ones and your community. General Information About Social Distancing Q: What is social distancing? A: Social distancing is the practice of purposefully reducing close contact between people. According to the CDC, social distancing means: . Remaining out of "congregate settings" as much as possible. . Avoiding mass gatherings. . Maintaining distance of about 6 feet from others when possible. Q: Why is social distancing important? A: Social distancing is crucial for preventing the spread of contagious illnesses such as COVID-19 (coronavirus). COVID-19 can spread through coughing, sneezing and close contact. By minimizing the amount of close contact we have with others, we reduce our chances of catching the virus and spreading it to our loved ones and within our community. Q: Who is social distancing important for? A: Social distancing is important for all of [place], but those of [place] who are at higher risk of serious complications caused by [id number]-[id number] should be especially cautious about social distancing. People who are at high risk of complications include: . Older adults. Marland Kitchen People who have serious chronic medical conditions like heart disease, diabetes and lung disease. Q: What is "flattening the curve"? What does it have to do with social distancing? A: "Flattening the curve" refers to reducing the number of people who are sick at one time. If there are high surges in the number of COVID-19 cases all at once, health care systems and resources could potentially become overwhelmed. Efforts that help stop COVID-19 from spreading rapidly - like social distancing - help keep the number of people who are sick at one time as low as possible. Q: When should I start practicing social distancing? A: The best time to begin social distancing is before an illness like COVID-19 becomes  widespread throughout your community. Each community's situation is unique, so it is important to follow the guidance of local government, health departments and health care providers. Currently in [place], gatherings of more than 10 people are banned. For the latest information on YVOPF-29 policies in [place], visit the [place] website and view news releases and executive orders. The advice below is applicable if you are symptom-free and have reasonable confidence that you have not had exposure to COVID-19. Always follow the guidance of local government, health departments and your health care providers. Visiting Public Spaces Q: How can I practice social distancing in the workplace? A: When possible, keeping about 6 feet of distance between yourself and others is key. It's also important to practice other preventative measures such as washing hands, avoiding touching your face, coughing into your elbow and staying home if you feel sick. Depending on your job and your community's situation, working from home may be an option. Always follow local guidance. Q: How can my child practice social distancing at school or at college?  A: Many schools and universities in the [place] have postponed in-person classes; currently in [place], [place] schools have been closed until May 15 (this is subject to change). It's important to follow local guidance, as each community's situation is unique. It's important for people of all ages to follow preventative measures, including staying about 6 feet away from others, avoiding touching your face, washing your hands and  coughing into your elbow. Q: Should I be concerned about going to the grocery store? A: In any place where large numbers of people gather, there is potential risk for disease transmission. When you visit the grocery store, keep about 6 feet between yourself and others and use prevention techniques  like avoiding touching your face and washing your hands. If possible, visit the store at times when there are likely to be fewer people shopping. Q: Should I take public transportation? A: If you have the option, driving yourself, walking to work or working from home can help reduce the number of people who are using public transportation, which benefits you and your community. In any of these situations, it's very important to keep distance between yourself and others in addition to practicing other preventative measures. Q: Should I stop visiting restaurants and bars? A: Currently in [place], restaurants and bars have been closed until further notice. Always follow local guidance. Avoiding public places as much as possible helps prevent diseases from spreading. If dining out is a non-essential activity, then it is generally in the best interest of you and your loved ones to avoid it. Q: Can I still go to the gym? A: Currently in [place], gyms have been closed until further notice. Always follow local guidance. Alternatives could include exercising in your home or yard or walking through your neighborhood. If you do go to the gym, wipe down and sanitize your equipment, keep distance between yourself and others, avoid touching other people and practice other preventative techniques.  Q: What about events and places where many people gather, such as concerts, festivals, sporting events and churches? A: Risk of disease transmission is much higher in large groups of people. Effective March 25 at 5 PM in [place], meetings of over 10 people are prohibited in order to prevent COVID-19 from spreading rapidly. Avoiding these gatherings are generally in the best interest of protecting yourself, your loved ones and your community. Always follow local guidance. Meeting with Others Q: Should I stop visiting my elderly relatives and friends?  A: Older adults are at high risk of serious  complications from DXIPJ-82. Limiting their exposure as much as possible to those who may be sick or who may be carrying the disease is crucial. This is a great opportunity to try other methods of connecting, such as over the phone or through a video chat. Q: What about social distancing with other people in my household? A: Avoiding close contact within a household is almost impossible, and social distancing is mainly focused on large groups. However, if someone in your household is sick, it's important to minimize close contact with them as much as is reasonable. Q: Should I stop meeting up with 1 or 2 friends? Should I stop dating? A: While meeting up with another person who also is symptom-free may be alright in some situations, keep in mind that risk of disease transmission is higher in public places. Now may be a good time to consider other methods of connecting with others, such as over the phone or through a video chat. Q: Can I have a small group of my extended family and/or friends over to my house? A: If possible, it's best to postpone these kinds of gatherings and look for alternative ways to connect. Avoiding non-essential gatherings is important for preventing the spread of disease. Q: Should my family and friends cancel big gathering events like weddings and birthday parties? A: Effective March 25 at 5  PM in [place], meetings of over 10 people are prohibited in order to prevent COVID-19 from spreading rapidly. Always follow local guidance. While it's difficult to postpone important events like these, it's also very important to protect our loved ones - especially our loved ones who are most vulnerable. It may be best to postpone or alter your plans.  Q: If I'm avoiding in-person gatherings, how can I stay connected to others? A: There are many ways you can connect with friends: phone calls, text messages, emails and video chats are all great virtual options. While physical social  distancing is important for our health, so is social interaction - trying alternative ways to stay connected is a good way to take care of your emotional health. If You Are Experiencing Symptoms Q: How should I approach social distancing if I start to feel sick? A: If you begin to experience symptoms, it's important to stay home and distance yourself from others. Always follow the guidance of your health care providers and local government. Q: Can I have visitors while I am in quarantine? A: Having visitors should be avoided as much as possible. Quarantining is an important way to protect your loved ones and community from picking up the disease; visitors are at high risk of catching the illness from you. Q: Can I go outside in my yard if I am in quarantine? A: Depending on where you live and your community's situation, going outside and getting some fresh air in your yard may be safe. Always follow the guidance of your health care providers and local government.

## 2018-04-21 LAB — CBC
Hematocrit: 36.1 % (ref 34.0–46.6)
Hemoglobin: 11.7 g/dL (ref 11.1–15.9)
MCH: 26.8 pg (ref 26.6–33.0)
MCHC: 32.4 g/dL (ref 31.5–35.7)
MCV: 83 fL (ref 79–97)
Platelets: 342 10*3/uL (ref 150–450)
RBC: 4.36 x10E6/uL (ref 3.77–5.28)
RDW: 14.6 % (ref 11.7–15.4)
WBC: 10.1 10*3/uL (ref 3.4–10.8)

## 2018-04-21 LAB — GLUCOSE TOLERANCE, 2 HOURS W/ 1HR
Glucose, 1 hour: 117 mg/dL (ref 65–179)
Glucose, 2 hour: 109 mg/dL (ref 65–152)
Glucose, Fasting: 82 mg/dL (ref 65–91)

## 2018-04-21 LAB — HIV ANTIBODY (ROUTINE TESTING W REFLEX): HIV Screen 4th Generation wRfx: NONREACTIVE

## 2018-04-21 LAB — RPR: RPR Ser Ql: NONREACTIVE

## 2018-05-04 ENCOUNTER — Encounter: Payer: Self-pay | Admitting: General Practice

## 2018-05-04 NOTE — Progress Notes (Unsigned)
Received alert from [employer] patient had elevated blood pressure 127/97 with repeat 120/84. Patient responded to [employer] questionnaire stating she had a headache at the time. Will route to [employer].

## 2018-05-06 ENCOUNTER — Telehealth: Payer: Self-pay | Admitting: General Practice

## 2018-05-06 NOTE — Telephone Encounter (Signed)
Called patient to check on her after elevated BP and reported headache. Patient states she took tylenol with the headache and it went away and she later felt better. Patient reports recheck BP was normal and she has felt fine since then. Asked patient to check BP again for me today. Patient states she cannot right now because she isn't at home but she will check it later this evening. Told patient that is fine, I'll be watching for her BP. Patient verbalized understanding & had no questions.

## 2018-05-07 ENCOUNTER — Other Ambulatory Visit: Payer: Self-pay

## 2018-05-07 ENCOUNTER — Encounter (HOSPITAL_COMMUNITY): Payer: Self-pay

## 2018-05-07 ENCOUNTER — Inpatient Hospital Stay (HOSPITAL_COMMUNITY)
Admission: AD | Admit: 2018-05-07 | Discharge: 2018-05-07 | Disposition: A | Discharge status: Home / Self Care | Payer: Medicaid Other | Attending: Obstetrics and Gynecology | Admitting: Obstetrics and Gynecology

## 2018-05-07 ENCOUNTER — Telehealth: Payer: Self-pay | Admitting: General Practice

## 2018-05-07 DIAGNOSIS — Z3403 Encounter for supervision of normal first pregnancy, third trimester: Secondary | ICD-10-CM

## 2018-05-07 DIAGNOSIS — Z3480 Encounter for supervision of other normal pregnancy, unspecified trimester: Secondary | ICD-10-CM

## 2018-05-07 DIAGNOSIS — O26893 Other specified pregnancy related conditions, third trimester: Secondary | ICD-10-CM | POA: Diagnosis not present

## 2018-05-07 DIAGNOSIS — G44229 Chronic tension-type headache, not intractable: Secondary | ICD-10-CM

## 2018-05-07 DIAGNOSIS — O99283 Endocrine, nutritional and metabolic diseases complicating pregnancy, third trimester: Secondary | ICD-10-CM | POA: Insufficient documentation

## 2018-05-07 DIAGNOSIS — O163 Unspecified maternal hypertension, third trimester: Secondary | ICD-10-CM | POA: Diagnosis present

## 2018-05-07 DIAGNOSIS — Z3A30 30 weeks gestation of pregnancy: Secondary | ICD-10-CM

## 2018-05-07 DIAGNOSIS — Z87891 Personal history of nicotine dependence: Secondary | ICD-10-CM | POA: Insufficient documentation

## 2018-05-07 DIAGNOSIS — R51 Headache: Secondary | ICD-10-CM | POA: Diagnosis not present

## 2018-05-07 DIAGNOSIS — Z7982 Long term (current) use of aspirin: Secondary | ICD-10-CM | POA: Diagnosis not present

## 2018-05-07 DIAGNOSIS — E282 Polycystic ovarian syndrome: Secondary | ICD-10-CM | POA: Diagnosis not present

## 2018-05-07 DIAGNOSIS — Z3689 Encounter for other specified antenatal screening: Secondary | ICD-10-CM | POA: Diagnosis not present

## 2018-05-07 DIAGNOSIS — O10913 Unspecified pre-existing hypertension complicating pregnancy, third trimester: Secondary | ICD-10-CM

## 2018-05-07 DIAGNOSIS — O10919 Unspecified pre-existing hypertension complicating pregnancy, unspecified trimester: Secondary | ICD-10-CM | POA: Insufficient documentation

## 2018-05-07 DIAGNOSIS — O10912 Unspecified pre-existing hypertension complicating pregnancy, second trimester: Secondary | ICD-10-CM | POA: Insufficient documentation

## 2018-05-07 HISTORY — DX: Anxiety disorder, unspecified: F41.9

## 2018-05-07 LAB — URINALYSIS, ROUTINE W REFLEX MICROSCOPIC
Bilirubin Urine: NEGATIVE
Glucose, UA: NEGATIVE mg/dL
Hgb urine dipstick: NEGATIVE
Ketones, ur: NEGATIVE mg/dL
Leukocytes,Ua: NEGATIVE
Nitrite: NEGATIVE
Protein, ur: NEGATIVE mg/dL
Specific Gravity, Urine: 1.011 (ref 1.005–1.030)
pH: 7 (ref 5.0–8.0)

## 2018-05-07 LAB — COMPREHENSIVE METABOLIC PANEL
ALT: 11 U/L (ref 0–44)
AST: 18 U/L (ref 15–41)
Albumin: 2.5 g/dL — ABNORMAL LOW (ref 3.5–5.0)
Alkaline Phosphatase: 122 U/L (ref 38–126)
Anion gap: 11 (ref 5–15)
BUN: <5 mg/dL — ABNORMAL LOW (ref 6–20)
CO2: 19 mmol/L — ABNORMAL LOW (ref 22–32)
Calcium: 8.7 mg/dL — ABNORMAL LOW (ref 8.9–10.3)
Chloride: 106 mmol/L (ref 98–111)
Creatinine, Ser: 0.62 mg/dL (ref 0.44–1.00)
GFR calc Af Amer: >60 mL/min (ref >60)
GFR calc non Af Amer: >60 mL/min (ref >60)
Glucose, Bld: 112 mg/dL — ABNORMAL HIGH (ref 70–99)
Potassium: 3.1 mmol/L — ABNORMAL LOW (ref 3.5–5.1)
Sodium: 136 mmol/L (ref 135–145)
Total Bilirubin: 0.4 mg/dL (ref 0.3–1.2)
Total Protein: 6.4 g/dL — ABNORMAL LOW (ref 6.5–8.1)

## 2018-05-07 LAB — CBC
HCT: 33.5 % — ABNORMAL LOW (ref 36.0–46.0)
Hemoglobin: 11.3 g/dL — ABNORMAL LOW (ref 12.0–15.0)
MCH: 26.8 pg (ref 26.0–34.0)
MCHC: 33.7 g/dL (ref 30.0–36.0)
MCV: 79.6 fL — ABNORMAL LOW (ref 80.0–100.0)
Platelets: 284 10*3/uL (ref 150–400)
RBC: 4.21 MIL/uL (ref 3.87–5.11)
RDW: 14.3 % (ref 11.5–15.5)
WBC: 9.5 10*3/uL (ref 4.0–10.5)
nRBC: 0 % (ref 0.0–0.2)

## 2018-05-07 LAB — PROTEIN / CREATININE RATIO, URINE
Creatinine, Urine: 121.18 mg/dL
Protein Creatinine Ratio: 0.07 mg/mg{Cre} (ref 0.00–0.15)
Total Protein, Urine: 9 mg/dL

## 2018-05-07 MED ORDER — BUTALBITAL-APAP-CAFFEINE 50-325-40 MG PO TABS
1.0000 | ORAL_TABLET | Freq: Four times a day (QID) | ORAL | Status: DC | PRN
  Administered 2018-05-07: 1 via ORAL
  Filled 2018-05-07: qty 1

## 2018-05-07 NOTE — Telephone Encounter (Signed)
Received alert from [facility] patient had elevated BP 146/96 with headache reported. Called patient and discussed she needed to go to [facility] for evaluation/lab work due to multiple elevated BPs. Provided directions. Patient verbalized understanding & had no questions.

## 2018-05-07 NOTE — Discharge Instructions (Signed)
Hypertension During Pregnancy ° °Hypertension is also called high blood pressure. High blood pressure means that the force of your blood moving in your body is too strong. When you are pregnant, this condition should be watched carefully. It can cause problems for you and your baby. °Follow these instructions at home: °Eating and drinking ° °· Drink enough fluid to keep your pee (urine) pale yellow. °· Avoid caffeine. °Lifestyle °· Do not use any products that contain nicotine or tobacco, such as cigarettes and e-cigarettes. If you need help quitting, ask your doctor. °· Do not use alcohol or drugs. °· Avoid stress. °· Rest and get plenty of sleep. °General instructions °· Take over-the-counter and prescription medicines only as told by your doctor. °· While lying down, lie on your left side. This keeps pressure off your major blood vessels. °· While sitting or lying down, raise (elevate) your feet. Try putting some pillows under your lower legs. °· Exercise regularly. Ask your doctor what kinds of exercise are best for you. °· Keep all prenatal and follow-up visits as told by your doctor. This is important. °Contact a doctor if: °· You have symptoms that your doctor told you to watch for, such as: °? Throwing up (vomiting). °? Feeling sick to your stomach (nausea). °? Headache. °Get help right away if you have: °· Very bad belly pain that does not get better with treatment. °· A very bad headache that does not get better. °· Throwing up that does not get better with treatment. °· Sudden, fast weight gain. °· Sudden swelling in your hands, ankles, or face. °· Bleeding from your vagina. °· Blood in your pee. °· Fewer movements from your baby than usual. °· Blurry vision. °· Double vision. °· Muscle twitching. °· Sudden muscle tightening (spasms). °· Trouble breathing. °· Blue fingernails or lips. °Summary °· Hypertension is also called high blood pressure. High blood pressure means that the force of your blood moving  in your body is too strong. °· When you are pregnant, this condition should be watched carefully. It can cause problems for you and your baby. °· Get help right away if you have symptoms that your doctor told you to watch for. °This information is not intended to replace advice given to you by your health care provider. Make sure you discuss any questions you have with your health care provider. °Document Released: 02/02/2010 Document Revised: 12/17/2016 Document Reviewed: 09/12/2015 °Elsevier Interactive Patient Education © 2019 [employer] ° °

## 2018-05-07 NOTE — MAU Provider Note (Signed)
History     [name]: [id number]  Arrival date and time: 05/07/18 1404   First Provider Initiated Contact with Patient 05/07/18 1500      Chief Complaint  Patient presents with  . Hypertension   G2P0010 @30 .4 wks presenting with elevated BP and HA. HA has been ongoing for "weeks", it is daily. HA is located peri-orbital, bilateral. She took Tylenol this am and it helped. Rates pain 3/10. Has occasional visual floaters. Having some blurry vision also but unsure if it's due to her breaking her glasses. Had elevated BP reading at home today 146/96. Denies RUQ pain, SOB, and CP. +FM. Also reports hx of bipolar and anxiety but has not taken meds in years. Started "self medicating" by smoking Marijuana when she was 12.   OB History    Gravida  2   Para      Term      Preterm      AB  1   Living  0     SAB  1   TAB      Ectopic      Multiple      Live Births              Past Medical History:  Diagnosis Date  . Anxiety   . Asthma   . Chlamydia   . Gonorrhea   . PCOS (polycystic ovarian syndrome)   . Seizures (HCC)    Last seizure in 2017  . Trichomonas infection     Past Surgical History:  Procedure Laterality Date  . ADENOIDECTOMY    . ROOT CANAL    . TONSILLECTOMY    . WISDOM TOOTH EXTRACTION      Family History  Problem Relation Age of Onset  . Asthma Mother   . Heart disease Father     Social History   Tobacco Use  . Smoking status: Former Smoker    Types: Cigarettes    Last attempt to quit: 10/03/2017    Years since quitting: 0.5  . Smokeless tobacco: Never Used  Substance Use Topics  . Alcohol use: Not Currently    Comment: Social  . Drug use: Not Currently    Types: Marijuana    Comment: Last marijuana 10/03/17    Allergies:  Allergies  Allergen Reactions  . Latex Hives and Swelling  . Red Dye Nausea And Vomiting and Other (See Comments)    Her body rejects it    Medications Prior to Admission  Medication Sig Dispense Refill  Last Dose  . aspirin EC 81 MG tablet Take 81 mg by mouth daily.   Past Month at Unknown time  . Prenatal Vit-Fe Phos-FA-Omega (VITAFOL GUMMIES) 3.33-0.333-34.8 MG CHEW Chew 3 each by mouth daily. 90 tablet 11 05/07/2018 at Unknown time  . promethazine (PHENERGAN) 25 MG tablet Take 1 tablet (25 mg total) by mouth every 6 (six) hours as needed for nausea or vomiting. 30 tablet 0 05/06/2018 at Unknown time  . polyethylene glycol powder (GLYCOLAX/MIRALAX) powder Take 17 g by mouth daily as needed for moderate constipation. (Patient not taking: Reported on 03/13/2018) 500 g 2 More than a month at Unknown time  . Prenatal Vit-Fe Fumarate-FA (PRENATAL MULTIVITAMIN) TABS tablet Take 1 tablet by mouth daily at 12 noon.   Taking    Review of Systems  Eyes: Positive for visual disturbance.  Respiratory: Negative for shortness of breath.   Cardiovascular: Negative for chest pain.  Gastrointestinal: Negative for abdominal pain.  Neurological: Positive for  headaches.   Physical Exam   Blood pressure (!) 112/91, pulse (!) 115, temperature 98.7 F (37.1 C), temperature source Oral, resp. rate 18, height 5' 4.5" (1.638 m), weight 126.1 kg, last menstrual period 08/26/2017, SpO2 99 %. Patient Vitals for the past 24 hrs:  BP Temp Temp src Pulse Resp SpO2 Height Weight  05/07/18 1600 (!) 112/91 - - (!) 115 - 99 % - -  05/07/18 1545 122/70 - - (!) 115 - 99 % - -  05/07/18 1530 121/86 - - (!) 117 - 100 % - -  05/07/18 1515 123/83 - - (!) 123 - 99 % - -  05/07/18 1500 (!) 131/92 - - (!) 134 - 100 % - -  05/07/18 1450 - - - - - 99 % - -  05/07/18 1445 127/82 98.7 F (37.1 C) Oral (!) 104 18 99 % - -  05/07/18 1440 - - - - - 99 % - -  05/07/18 1437 127/71 - - (!) 144 - - - -  05/07/18 1416 136/87 98.7 F (37.1 C) - (!) 109 18 - 5' 4.5" (1.638 m) 126.1 kg   Physical Exam  Nursing note and vitals reviewed. Constitutional: She is oriented to person, place, and time. She appears well-developed and  well-nourished. No distress.  HENT:  Head: Normocephalic and atraumatic.  Cardiovascular:  tachy  Respiratory: Effort normal.  Neurological: She is alert and oriented to person, place, and time.  Psychiatric: Her mood appears anxious.  EFM: 135 bpm, mod variability, + accels, no decels Toco: irritability  Results for orders placed or performed during the hospital encounter of 05/07/18 (from the past 24 hour(s))  Protein / creatinine ratio, urine     Status: None   Collection Time: 05/07/18  2:26 PM  Result Value Ref Range   Creatinine, Urine 121.18 mg/dL   Total Protein, Urine 9 mg/dL   Protein Creatinine Ratio 0.07 0.00 - 0.15 mg/mg[Cre]  CBC     Status: Abnormal   Collection Time: 05/07/18  2:39 PM  Result Value Ref Range   WBC 9.5 4.0 - 10.5 K/uL   RBC 4.21 3.87 - 5.11 MIL/uL   Hemoglobin 11.3 (L) 12.0 - 15.0 g/dL   HCT 91.4 (L) 78.2 - 95.6 %   MCV 79.6 (L) 80.0 - 100.0 fL   MCH 26.8 26.0 - 34.0 pg   MCHC 33.7 30.0 - 36.0 g/dL   RDW 21.3 08.6 - 57.8 %   Platelets 284 150 - 400 K/uL   nRBC 0.0 0.0 - 0.2 %  Comprehensive metabolic panel     Status: Abnormal   Collection Time: 05/07/18  2:39 PM  Result Value Ref Range   Sodium 136 135 - 145 mmol/L   Potassium 3.1 (L) 3.5 - 5.1 mmol/L   Chloride 106 98 - 111 mmol/L   CO2 19 (L) 22 - 32 mmol/L   Glucose, Bld 112 (H) 70 - 99 mg/dL   BUN <5 (L) 6 - 20 mg/dL   Creatinine, Ser 4.69 0.44 - 1.00 mg/dL   Calcium 8.7 (L) 8.9 - 10.3 mg/dL   Total Protein 6.4 (L) 6.5 - 8.1 g/dL   Albumin 2.5 (L) 3.5 - 5.0 g/dL   AST 18 15 - 41 U/L   ALT 11 0 - 44 U/L   Alkaline Phosphatase 122 38 - 126 U/L   Total Bilirubin 0.4 0.3 - 1.2 mg/dL   GFR calc non Af Amer >60 >60 mL/min   GFR calc Af Amer >  60 >60 mL/min   Anion gap 11 5 - 15  Urinalysis, Routine w reflex microscopic     Status: None   Collection Time: 05/07/18  2:53 PM  Result Value Ref Range   Color, Urine YELLOW YELLOW   APPearance CLEAR CLEAR   Specific Gravity, Urine 1.011  1.005 - 1.030   pH 7.0 5.0 - 8.0   Glucose, UA NEGATIVE NEGATIVE mg/dL   Hgb urine dipstick NEGATIVE NEGATIVE   Bilirubin Urine NEGATIVE NEGATIVE   Ketones, ur NEGATIVE NEGATIVE mg/dL   Protein, ur NEGATIVE NEGATIVE mg/dL   Nitrite NEGATIVE NEGATIVE   Leukocytes,Ua NEGATIVE NEGATIVE   MAU Course  Procedures Orders Placed This Encounter  Procedures  . CBC    Standing Status:   Standing    Number of Occurrences:   1  . Comprehensive metabolic panel    Standing Status:   Standing    Number of Occurrences:   1  . Protein / creatinine ratio, urine    Standing Status:   Standing    Number of Occurrences:   1  . Urinalysis, Routine w reflex microscopic    Standing Status:   Standing    Number of Occurrences:   1  . Discharge patient    Order Specific Question:   Discharge disposition    Answer:   01-Home or Self Care [1]    Order Specific Question:   Discharge patient date    Answer:   05/07/2018   MDM Review of her chart shows several elevated BPs since 2014 when she was not pregnant. BP was also elevated at 15 wks and again about 3 days ago. Pt denies any known hx of CHTN. CHTN not listed on problem list- updated today. Labs ordered and reviewed. No evidence of PEC. HA resolved. Stable for discharge home.  Assessment and Plan  [redacted] weeks gestation [facility] Headache Discharge home Follow up at [facility] as scheduled PEC precautions  Allergies as of 05/07/2018      Reactions   Latex Hives, Swelling   Red Dye Nausea And Vomiting, Other (See Comments)   Her body rejects it      Medication List    STOP taking these medications   polyethylene glycol powder 17 GM/SCOOP powder Commonly known as:  GLYCOLAX/MIRALAX   Vitafol Gummies 3.33-0.333-34.8 MG Chew     TAKE these medications   aspirin EC 81 MG tablet Take 81 mg by mouth daily.   prenatal multivitamin Tabs tablet Take 1 tablet by mouth daily at 12 noon.   promethazine 25 MG tablet Commonly known as:  PHENERGAN Take 1  tablet (25 mg total) by mouth every 6 (six) hours as needed for nausea or vomiting.      Donette Larry, CNM 05/07/2018, 4:01 PM

## 2018-05-07 NOTE — MAU Note (Signed)
Pt has been monitoring b/p at home.  Pressure has been elevated for a few days. Has had headache on and off and some blurred vision. MD called patinet to come get checked had a b/p on 4/22 146/96. Has not taken it since. Good fetal movement felt.

## 2018-05-14 ENCOUNTER — Telehealth: Payer: Self-pay | Admitting: Family Medicine

## 2018-05-14 NOTE — Telephone Encounter (Signed)
Informed patient of upcoming appointment 

## 2018-05-18 ENCOUNTER — Ambulatory Visit (HOSPITAL_COMMUNITY): Payer: Medicaid Other | Admitting: *Deleted

## 2018-05-18 ENCOUNTER — Encounter (HOSPITAL_COMMUNITY): Payer: Self-pay

## 2018-05-18 ENCOUNTER — Ambulatory Visit (HOSPITAL_COMMUNITY)
Admission: RE | Admit: 2018-05-18 | Discharge: 2018-05-18 | Disposition: A | Discharge status: Home / Self Care | Payer: Medicaid Other | Source: Ambulatory Visit | Attending: Obstetrics and Gynecology | Admitting: Obstetrics and Gynecology

## 2018-05-18 ENCOUNTER — Other Ambulatory Visit: Payer: Self-pay

## 2018-05-18 VITALS — BP 131/86 | HR 117 | Temp 98.3°F

## 2018-05-18 DIAGNOSIS — O10013 Pre-existing essential hypertension complicating pregnancy, third trimester: Secondary | ICD-10-CM

## 2018-05-18 DIAGNOSIS — Z362 Encounter for other antenatal screening follow-up: Secondary | ICD-10-CM

## 2018-05-18 DIAGNOSIS — O99353 Diseases of the nervous system complicating pregnancy, third trimester: Secondary | ICD-10-CM | POA: Diagnosis not present

## 2018-05-18 DIAGNOSIS — O99213 Obesity complicating pregnancy, third trimester: Secondary | ICD-10-CM | POA: Diagnosis not present

## 2018-05-18 DIAGNOSIS — G40909 Epilepsy, unspecified, not intractable, without status epilepticus: Secondary | ICD-10-CM

## 2018-05-18 DIAGNOSIS — O10919 Unspecified pre-existing hypertension complicating pregnancy, unspecified trimester: Secondary | ICD-10-CM | POA: Diagnosis present

## 2018-05-18 DIAGNOSIS — Z3A32 32 weeks gestation of pregnancy: Secondary | ICD-10-CM

## 2018-05-19 ENCOUNTER — Other Ambulatory Visit (HOSPITAL_COMMUNITY): Payer: Self-pay | Admitting: *Deleted

## 2018-05-19 DIAGNOSIS — O10913 Unspecified pre-existing hypertension complicating pregnancy, third trimester: Secondary | ICD-10-CM

## 2018-05-21 ENCOUNTER — Telehealth: Payer: Self-pay | Admitting: Obstetrics and Gynecology

## 2018-05-21 NOTE — Telephone Encounter (Signed)
Opened in Error.

## 2018-05-21 NOTE — Telephone Encounter (Signed)
Called the patient to schedule an appointment as she was last seen 4/6. Informed of the virtual visit. The app was downloaded during the phone call.

## 2018-05-25 ENCOUNTER — Other Ambulatory Visit: Payer: Self-pay

## 2018-05-25 ENCOUNTER — Ambulatory Visit (INDEPENDENT_AMBULATORY_CARE_PROVIDER_SITE_OTHER): Payer: Medicaid Other

## 2018-05-25 VITALS — BP 125/101

## 2018-05-25 DIAGNOSIS — Z3480 Encounter for supervision of other normal pregnancy, unspecified trimester: Secondary | ICD-10-CM

## 2018-05-25 DIAGNOSIS — O10919 Unspecified pre-existing hypertension complicating pregnancy, unspecified trimester: Secondary | ICD-10-CM

## 2018-05-25 DIAGNOSIS — B951 Streptococcus, group B, as the cause of diseases classified elsewhere: Secondary | ICD-10-CM

## 2018-05-25 DIAGNOSIS — Z3A33 33 weeks gestation of pregnancy: Secondary | ICD-10-CM

## 2018-05-25 DIAGNOSIS — O98819 Other maternal infectious and parasitic diseases complicating pregnancy, unspecified trimester: Secondary | ICD-10-CM

## 2018-05-25 MED ORDER — LABETALOL HCL 200 MG PO TABS: 200.0000 mg | ORAL_TABLET | Freq: Two times a day (BID) | ORAL | 3 refills | Status: DC

## 2018-05-25 NOTE — Progress Notes (Signed)
   TELEHEALTH VIRTUAL OBSTETRICS VISIT ENCOUNTER NOTE  I connected with Kathyann Mincey on 05/25/18 at  9:15 AM EDT by WebEx at home and verified that I am speaking with the correct person using two identifiers.   I discussed the limitations, risks, security and privacy concerns of performing an evaluation and management service by telephone and the availability of in person appointments. I also discussed with the patient that there may be a patient responsible charge related to this service. The patient expressed understanding and agreed to proceed.  Subjective:  Colleen Stone is a 23 y.o. G2P0010 at [redacted]w[redacted]d being followed for ongoing prenatal care.  She is currently monitored for the following issues for this high-risk pregnancy and has Supervision of other normal pregnancy, antepartum; Bipolar 1 disorder (HCC); Atypical squamous cells of undetermined significance (ASCUS) on Papanicolaou smear of cervix; Family history of breast cancer; Other acne; Family history of schizophrenia; Smoker; Group B streptococcal infection during pregnancy; Obesity affecting pregnancy in second trimester; Fetal cardiac echogenic focus; Obesity in pregnancy, antepartum; Late entry babyscripts optimization ; and Chronic hypertension affecting pregnancy on their problem list.  Patient reports no complaints. Reports fetal movement. Denies any contractions, bleeding or leaking of fluid.   The following portions of the patient's history were reviewed and updated as appropriate: allergies, current medications, past family history, past medical history, past social history, past surgical history and problem list.   Objective:   General:  Alert, oriented and cooperative.   Mental Status: Normal mood and affect perceived. Normal judgment and thought content.  Rest of physical exam deferred due to type of encounter  Assessment and Plan:  Pregnancy: G2P0010 at [redacted]w[redacted]d 1. Supervision of other normal pregnancy, antepartum  -No complaints. Routine care -Anticipatory guidance of next visit in person. -Patient with many questions regarding labor and pain management. Reviewed at length with patient. Visitor restrictions due to COVID-19 reviewed  2. Chronic hypertension affecting pregnancy -BP 139/100 and 125/101. Denies HA, visual changes or epigastric pain. Patient was seen on 4/23 with normal preeclampsia labs. Consulted with Dr. Earlene Plater- will start labetalol 200mg  BID. Reviewed at length with patient and warning signs to return to MAU discussed. -Growth u/s on 5/4 normal. Repeat scheduled for 6/1. Per COVID-19 antenatal testing guidelines, no weekly BP needed.  3. Group B streptococcal infection during pregnancy -Discussed intrapartum prophylaxis  Preterm labor symptoms and general obstetric precautions including but not limited to vaginal bleeding, contractions, leaking of fluid and fetal movement were reviewed in detail with the patient.  I discussed the assessment and treatment plan with the patient. The patient was provided an opportunity to ask questions and all were answered. The patient agreed with the plan and demonstrated an understanding of the instructions. The patient was advised to call back or seek an in-person office evaluation/go to MAU at [facility] for any urgent or concerning symptoms. Please refer to After Visit Summary for other counseling recommendations.   I provided 30 minutes of non-face-to-face time during this encounter.  Return in about 3 weeks (around 06/15/2018) for Return OB visit.  Future Appointments  Date Time Provider Department Center  06/09/2018 11:15 AM Madlyn Frankel [facility]  06/15/2018 11:15 AM [facility] NURSE [facility]  06/15/2018 11:15 AM [facility] [place] 4 [facility]    Rolm Bookbinder, CNM [facility] [employer]

## 2018-05-25 NOTE — Progress Notes (Signed)
Pts BP elevated, states has not taken Lo Dose Asprin in over a week.

## 2018-06-04 ENCOUNTER — Telehealth: Payer: Self-pay | Admitting: General Practice

## 2018-06-04 NOTE — Telephone Encounter (Signed)
Received alert from babyscripts for elevated BP of 126/95. Called patient and asked if she had been taking her medication or felt symptomatic. Patient reports taking medication twice daily as prescribed but hasn't taken it yet this morning. Patient reports some headaches with blurry vision and dizziness yesterday. Asked patient to recheck BP this morning- BP is 141/94. Reviewed with Dr Vergie Living who states, okay to continue to monitor BP at home. No intervention currently needed. Reviewed with patient & told her to call for severe headache, dizziness or blurry vision. Patient verbalized understanding & had no questions.

## 2018-06-09 ENCOUNTER — Ambulatory Visit (INDEPENDENT_AMBULATORY_CARE_PROVIDER_SITE_OTHER): Payer: Medicaid Other

## 2018-06-09 ENCOUNTER — Other Ambulatory Visit: Payer: Self-pay | Admitting: Obstetrics & Gynecology

## 2018-06-09 ENCOUNTER — Other Ambulatory Visit: Payer: Self-pay

## 2018-06-09 VITALS — BP 142/92 | Wt 284.0 lb

## 2018-06-09 DIAGNOSIS — O10919 Unspecified pre-existing hypertension complicating pregnancy, unspecified trimester: Secondary | ICD-10-CM

## 2018-06-09 MED ORDER — LABETALOL HCL 200 MG PO TABS: 200.0000 mg | ORAL_TABLET | Freq: Three times a day (TID) | ORAL | 0 refills | Status: DC

## 2018-06-09 NOTE — Progress Notes (Signed)
Pt in for BF assessment. BP 142/92 to left arm. Weight 284 pounds 128.8 kg.   Pt reports she is taking Labetalol 200 mg BID. Pt reports at least every other day headache, sometimes daily. Pt reports occasional dizziness. She reports blurred vision with headaches. Pt is noted to have swelling to ankles, no pitting noted today.   Reviewed with pt how to apply her BP cuff at home, she described how to apply appropriately.   Pt takes Tylenol, rest and fluids for her HA.   Discussed with Dr. Debroah Loop. Labs ordered and Labetalol increased to TID. Pt voiced understanding. Advised pt to take a Labetalol when she returns home today.   Pt to follow up with Provider on Monday. She is aware to call with questions/concerns otherwise. Pt aware if blurred vision or headaches worsen, pt is to report to MAU.

## 2018-06-10 LAB — PROTEIN / CREATININE RATIO, URINE
Creatinine, Urine: 160.8 mg/dL
Protein, Ur: 15 mg/dL
Protein/Creat Ratio: 93 mg/g creat (ref 0–200)

## 2018-06-10 LAB — CBC
Hematocrit: 33.6 % — ABNORMAL LOW (ref 34.0–46.6)
Hemoglobin: 11.1 g/dL (ref 11.1–15.9)
MCH: 26.1 pg — ABNORMAL LOW (ref 26.6–33.0)
MCHC: 33 g/dL (ref 31.5–35.7)
MCV: 79 fL (ref 79–97)
Platelets: 323 10*3/uL (ref 150–450)
RBC: 4.26 x10E6/uL (ref 3.77–5.28)
RDW: 14.4 % (ref 11.7–15.4)
WBC: 10.1 10*3/uL (ref 3.4–10.8)

## 2018-06-10 LAB — COMPREHENSIVE METABOLIC PANEL
ALT: 12 IU/L (ref 0–32)
AST: 15 IU/L (ref 0–40)
Albumin/Globulin Ratio: 1.1 — ABNORMAL LOW (ref 1.2–2.2)
Albumin: 3.1 g/dL — ABNORMAL LOW (ref 3.9–5.0)
Alkaline Phosphatase: 171 IU/L — ABNORMAL HIGH (ref 39–117)
BUN/Creatinine Ratio: 9 (ref 9–23)
BUN: 5 mg/dL — ABNORMAL LOW (ref 6–20)
Bilirubin Total: <0.2 mg/dL (ref 0.0–1.2)
CO2: 19 mmol/L — ABNORMAL LOW (ref 20–29)
Calcium: 9 mg/dL (ref 8.7–10.2)
Chloride: 105 mmol/L (ref 96–106)
Creatinine, Ser: 0.55 mg/dL — ABNORMAL LOW (ref 0.57–1.00)
GFR calc Af Amer: 152 mL/min/{1.73_m2} (ref >59)
GFR calc non Af Amer: 132 mL/min/{1.73_m2} (ref >59)
Globulin, Total: 2.8 g/dL (ref 1.5–4.5)
Glucose: 69 mg/dL (ref 65–99)
Potassium: 4.7 mmol/L (ref 3.5–5.2)
Sodium: 137 mmol/L (ref 134–144)
Total Protein: 5.9 g/dL — ABNORMAL LOW (ref 6.0–8.5)

## 2018-06-15 ENCOUNTER — Ambulatory Visit (HOSPITAL_COMMUNITY)
Admission: RE | Admit: 2018-06-15 | Discharge: 2018-06-15 | Disposition: A | Discharge status: Home / Self Care | Payer: Medicaid Other | Source: Ambulatory Visit | Attending: Obstetrics and Gynecology | Admitting: Obstetrics and Gynecology

## 2018-06-15 ENCOUNTER — Other Ambulatory Visit: Payer: Self-pay

## 2018-06-15 ENCOUNTER — Other Ambulatory Visit (HOSPITAL_COMMUNITY): Payer: Self-pay | Admitting: *Deleted

## 2018-06-15 ENCOUNTER — Ambulatory Visit (INDEPENDENT_AMBULATORY_CARE_PROVIDER_SITE_OTHER): Payer: Medicaid Other | Admitting: Obstetrics and Gynecology

## 2018-06-15 ENCOUNTER — Ambulatory Visit (HOSPITAL_COMMUNITY): Payer: Medicaid Other | Admitting: *Deleted

## 2018-06-15 ENCOUNTER — Other Ambulatory Visit (HOSPITAL_COMMUNITY)
Admission: RE | Admit: 2018-06-15 | Discharge: 2018-06-15 | Disposition: A | Discharge status: Home / Self Care | Payer: Medicaid Other | Source: Ambulatory Visit | Attending: Obstetrics and Gynecology | Admitting: Obstetrics and Gynecology

## 2018-06-15 ENCOUNTER — Encounter: Payer: Self-pay | Admitting: Obstetrics and Gynecology

## 2018-06-15 VITALS — BP 120/81 | HR 102 | Temp 98.5°F

## 2018-06-15 VITALS — BP 116/64 | HR 106 | Temp 98.5°F | Wt 280.8 lb

## 2018-06-15 DIAGNOSIS — Z3480 Encounter for supervision of other normal pregnancy, unspecified trimester: Secondary | ICD-10-CM | POA: Diagnosis present

## 2018-06-15 DIAGNOSIS — G40909 Epilepsy, unspecified, not intractable, without status epilepticus: Secondary | ICD-10-CM | POA: Diagnosis not present

## 2018-06-15 DIAGNOSIS — O10913 Unspecified pre-existing hypertension complicating pregnancy, third trimester: Secondary | ICD-10-CM

## 2018-06-15 DIAGNOSIS — Z362 Encounter for other antenatal screening follow-up: Secondary | ICD-10-CM | POA: Diagnosis not present

## 2018-06-15 DIAGNOSIS — Z3A36 36 weeks gestation of pregnancy: Secondary | ICD-10-CM

## 2018-06-15 DIAGNOSIS — O99213 Obesity complicating pregnancy, third trimester: Secondary | ICD-10-CM

## 2018-06-15 DIAGNOSIS — O10919 Unspecified pre-existing hypertension complicating pregnancy, unspecified trimester: Secondary | ICD-10-CM

## 2018-06-15 DIAGNOSIS — O10013 Pre-existing essential hypertension complicating pregnancy, third trimester: Secondary | ICD-10-CM | POA: Diagnosis not present

## 2018-06-15 DIAGNOSIS — O99353 Diseases of the nervous system complicating pregnancy, third trimester: Secondary | ICD-10-CM

## 2018-06-15 DIAGNOSIS — B951 Streptococcus, group B, as the cause of diseases classified elsewhere: Secondary | ICD-10-CM

## 2018-06-15 DIAGNOSIS — Z8669 Personal history of other diseases of the nervous system and sense organs: Secondary | ICD-10-CM | POA: Insufficient documentation

## 2018-06-15 DIAGNOSIS — O98813 Other maternal infectious and parasitic diseases complicating pregnancy, third trimester: Secondary | ICD-10-CM

## 2018-06-15 MED ORDER — LABETALOL HCL 200 MG PO TABS: 200.0000 mg | ORAL_TABLET | Freq: Two times a day (BID) | ORAL | 0 refills | Status: DC

## 2018-06-15 NOTE — Progress Notes (Signed)
Patient ID: Colleen Stone, female   DOB: [birth year], 23 y.o.   MRN: [id number] I have reviewed the chart and agree with nursing staff's documentation of this patient's encounter.  Scheryl Darter, MD 06/15/2018 7:16 PM

## 2018-06-15 NOTE — Progress Notes (Signed)
Prenatal Visit Note Date: 06/15/2018 Clinic: [facility]  Subjective:  Colleen Stone is a 23 y.o. G2P0010 at [redacted]w[redacted]d being seen today for ongoing prenatal care.  She is currently monitored for the following issues for this high-risk pregnancy and has Supervision of high risk pregnancy, antepartum; Bipolar 1 disorder (HCC); Atypical squamous cells of undetermined significance (ASCUS) on Papanicolaou smear of cervix; Family history of breast cancer; Family history of schizophrenia; Smoker; Group B streptococcal infection during pregnancy; Obesity affecting pregnancy in second trimester; Fetal cardiac echogenic focus; Obesity in pregnancy, antepartum; Late entry babyscripts optimization ; Chronic hypertension affecting pregnancy; and History of seizure disorder on their problem list.  Patient reports no complaints.   Contractions: Irritability. Vag. Bleeding: None.  Movement: Present. Denies leaking of fluid.   The following portions of the patient's history were reviewed and updated as appropriate: allergies, current medications, past family history, past medical history, past social history, past surgical history and problem list. Problem list updated.  Objective:   Vitals:   06/15/18 0836  BP: 116/64  Pulse: (!) 106  Temp: 98.5 F (36.9 C)  Weight: 280 lb 12.8 oz (127.4 kg)    Fetal Status: Fetal Heart Rate (bpm): 143   Movement: Present     General:  Alert, oriented and cooperative. Patient is in no acute distress.  Skin: Skin is warm and dry. No rash noted.   Cardiovascular: Normal heart rate noted  Respiratory: Normal respiratory effort, no problems with respiration noted  Abdomen: Soft, gravid, appropriate for gestational age. Pain/Pressure: Present     Pelvic:  Cervical exam performed Dilation: 1 Effacement (%): 50 Station: Ballotable  Extremities: Normal range of motion.  Edema: Trace  Mental Status: Normal mood and affect. Normal behavior. Normal judgment  and thought content.   Urinalysis:      Assessment and Plan:  Pregnancy: G2P0010 at [redacted]w[redacted]d  1. Supervision of other normal pregnancy, antepartum Routine care - GC/Chlamydia probe amp ([place])not at [facility]  2. Chronic hypertension affecting pregnancy On labetalol 200 tid. Will drop to bid. Has growth and will add on bpp for today. qwk testing until 39wk delivery d/w pt - [place] MFM FETAL BPP WO NON STRESS; Future  3. Group B streptococcal infection during pregnancy tx in labor   Preterm labor symptoms and general obstetric precautions including but not limited to vaginal bleeding, contractions, leaking of fluid and fetal movement were reviewed in detail with the patient. Please refer to After Visit Summary for other counseling recommendations.  Return in about 1 week (around 06/22/2018) for hrob with bpp, nst. in person.   [place] Bing, MD

## 2018-06-16 LAB — GC/CHLAMYDIA PROBE AMP (~~LOC~~) NOT AT ARMC
Chlamydia: NEGATIVE
Neisseria Gonorrhea: NEGATIVE

## 2018-06-22 ENCOUNTER — Encounter (HOSPITAL_COMMUNITY): Payer: Self-pay | Admitting: *Deleted

## 2018-06-22 ENCOUNTER — Ambulatory Visit: Payer: Medicaid Other | Admitting: *Deleted

## 2018-06-22 ENCOUNTER — Ambulatory Visit (INDEPENDENT_AMBULATORY_CARE_PROVIDER_SITE_OTHER): Payer: Medicaid Other | Admitting: Obstetrics and Gynecology

## 2018-06-22 ENCOUNTER — Other Ambulatory Visit: Payer: Self-pay

## 2018-06-22 ENCOUNTER — Ambulatory Visit (HOSPITAL_COMMUNITY): Payer: Medicaid Other

## 2018-06-22 ENCOUNTER — Ambulatory Visit: Payer: Self-pay

## 2018-06-22 ENCOUNTER — Telehealth (HOSPITAL_COMMUNITY): Payer: Self-pay | Admitting: *Deleted

## 2018-06-22 ENCOUNTER — Encounter (HOSPITAL_COMMUNITY): Payer: Self-pay

## 2018-06-22 VITALS — BP 113/60 | HR 118 | Wt 280.5 lb

## 2018-06-22 DIAGNOSIS — O10913 Unspecified pre-existing hypertension complicating pregnancy, third trimester: Secondary | ICD-10-CM

## 2018-06-22 DIAGNOSIS — Z3A37 37 weeks gestation of pregnancy: Secondary | ICD-10-CM

## 2018-06-22 DIAGNOSIS — O98813 Other maternal infectious and parasitic diseases complicating pregnancy, third trimester: Secondary | ICD-10-CM

## 2018-06-22 DIAGNOSIS — B951 Streptococcus, group B, as the cause of diseases classified elsewhere: Secondary | ICD-10-CM

## 2018-06-22 DIAGNOSIS — O10919 Unspecified pre-existing hypertension complicating pregnancy, unspecified trimester: Secondary | ICD-10-CM

## 2018-06-22 DIAGNOSIS — O0990 Supervision of high risk pregnancy, unspecified, unspecified trimester: Secondary | ICD-10-CM

## 2018-06-22 DIAGNOSIS — Z8669 Personal history of other diseases of the nervous system and sense organs: Secondary | ICD-10-CM

## 2018-06-22 DIAGNOSIS — O0993 Supervision of high risk pregnancy, unspecified, third trimester: Secondary | ICD-10-CM

## 2018-06-22 NOTE — Telephone Encounter (Signed)
Preadmission screen  

## 2018-06-22 NOTE — Progress Notes (Signed)
Pt reports having a migraine H/A yesterday which was relieved after 2 doses of Tylenol and rest. Pt also reports having mucous vaginal d/c and cramping in her back and hips yesterday and today.  Pt states she stopped taking ASA 81 mg when Labetalol was prescribed - thought she was supposed to. [place] for growth done 6/2.  Pt informed that the ultrasound is considered a limited OB ultrasound and is not intended to be a complete ultrasound exam.  Patient also informed that the ultrasound is not being completed with the intent of assessing for fetal or placental anomalies or any pelvic abnormalities.  Explained that the purpose of today's ultrasound is to assess for presentation, BPP and amniotic fluid volume.  Patient acknowledges the purpose of the exam and the limitations of the study.

## 2018-06-22 NOTE — Progress Notes (Signed)
Prenatal Visit Note Date: 06/22/2018 Clinic: [facility]  Subjective:  Colleen Stone is a 23 y.o. G2P0010 at [redacted]w[redacted]d being seen today for ongoing prenatal care.  She is currently monitored for the following issues for this high-risk pregnancy and has Supervision of high risk pregnancy, antepartum; Bipolar 1 disorder ([place]); Atypical squamous cells of undetermined significance (ASCUS) on Papanicolaou smear of cervix; Family history of breast cancer; Family history of schizophrenia; Smoker; Group B streptococcal infection during pregnancy; Obesity affecting pregnancy in second trimester; Fetal cardiac echogenic focus; Obesity in pregnancy, antepartum; Late entry babyscripts optimization ; Chronic hypertension affecting pregnancy; and History of seizure disorder on their problem list.  Patient reports see RN note.   Contractions: Irregular. Vag. Bleeding: None.  Movement: Present. Denies leaking of fluid.   The following portions of the patient's history were reviewed and updated as appropriate: allergies, current medications, past family history, past medical history, past social history, past surgical history and problem list. Problem list updated.  Objective:   Vitals:   06/22/18 0922  BP: 113/60  Pulse: (!) 118  Weight: 280 lb 8 oz (127.2 kg)    Fetal Status: Fetal Heart Rate (bpm): NST   Movement: Present     General:  Alert, oriented and cooperative. Patient is in no acute distress.  Skin: Skin is warm and dry. No rash noted.   Cardiovascular: Normal heart rate noted  Respiratory: Normal respiratory effort, no problems with respiration noted  Abdomen: Soft, gravid, appropriate for gestational age. Pain/Pressure: Present     Pelvic:  Cervical exam deferred        Extremities: Normal range of motion.     Mental Status: Normal mood and affect. Normal behavior. Normal judgment and thought content.   Urinalysis:      Assessment and Plan:  Pregnancy: G2P0010 at  [redacted]w[redacted]d  1. Supervision of high risk pregnancy in third trimester Routine care. Will set up for iol at 39wks  2. Chronic hypertension affecting pregnancy Doing well on labetalol 200 bid and r/s low dose asa bpp 10/10 today. See above. Normal growth on 6/1  3. History of seizure disorder No issue. On no meds  4. Supervision of high risk pregnancy, antepartum  5. Group B streptococcal infection during pregnancy tx in labor  Term labor symptoms and general obstetric precautions including but not limited to vaginal bleeding, contractions, leaking of fluid and fetal movement were reviewed in detail with the patient. Please refer to After Visit Summary for other counseling recommendations.  Return in about 1 week (around 06/29/2018) for NST/BPP and HOB.   Aletha Halim, MD

## 2018-06-23 ENCOUNTER — Encounter: Payer: Self-pay | Admitting: *Deleted

## 2018-06-29 ENCOUNTER — Ambulatory Visit (INDEPENDENT_AMBULATORY_CARE_PROVIDER_SITE_OTHER): Payer: Medicaid Other | Admitting: *Deleted

## 2018-06-29 ENCOUNTER — Encounter (HOSPITAL_COMMUNITY): Payer: Self-pay | Admitting: *Deleted

## 2018-06-29 ENCOUNTER — Ambulatory Visit (HOSPITAL_COMMUNITY): Payer: Medicaid Other

## 2018-06-29 ENCOUNTER — Encounter: Payer: Self-pay | Admitting: Obstetrics and Gynecology

## 2018-06-29 ENCOUNTER — Other Ambulatory Visit: Payer: Self-pay | Admitting: Obstetrics and Gynecology

## 2018-06-29 ENCOUNTER — Ambulatory Visit: Payer: Self-pay

## 2018-06-29 ENCOUNTER — Inpatient Hospital Stay (HOSPITAL_COMMUNITY)
Admission: AD | Admit: 2018-06-29 | Discharge: 2018-06-29 | Disposition: A | Discharge status: Home / Self Care | Payer: Medicaid Other | Attending: Obstetrics & Gynecology | Admitting: Obstetrics & Gynecology

## 2018-06-29 ENCOUNTER — Other Ambulatory Visit: Payer: Self-pay

## 2018-06-29 ENCOUNTER — Ambulatory Visit (INDEPENDENT_AMBULATORY_CARE_PROVIDER_SITE_OTHER): Payer: Medicaid Other | Admitting: Obstetrics and Gynecology

## 2018-06-29 VITALS — BP 126/75 | HR 117 | Wt 284.9 lb

## 2018-06-29 DIAGNOSIS — Z3689 Encounter for other specified antenatal screening: Secondary | ICD-10-CM | POA: Insufficient documentation

## 2018-06-29 DIAGNOSIS — O471 False labor at or after 37 completed weeks of gestation: Secondary | ICD-10-CM | POA: Diagnosis present

## 2018-06-29 DIAGNOSIS — Z3A39 39 weeks gestation of pregnancy: Secondary | ICD-10-CM | POA: Diagnosis not present

## 2018-06-29 DIAGNOSIS — O98813 Other maternal infectious and parasitic diseases complicating pregnancy, third trimester: Secondary | ICD-10-CM

## 2018-06-29 DIAGNOSIS — O0993 Supervision of high risk pregnancy, unspecified, third trimester: Secondary | ICD-10-CM

## 2018-06-29 DIAGNOSIS — O10919 Unspecified pre-existing hypertension complicating pregnancy, unspecified trimester: Secondary | ICD-10-CM

## 2018-06-29 DIAGNOSIS — O10913 Unspecified pre-existing hypertension complicating pregnancy, third trimester: Secondary | ICD-10-CM

## 2018-06-29 DIAGNOSIS — O0990 Supervision of high risk pregnancy, unspecified, unspecified trimester: Secondary | ICD-10-CM

## 2018-06-29 DIAGNOSIS — Z3403 Encounter for supervision of normal first pregnancy, third trimester: Secondary | ICD-10-CM

## 2018-06-29 DIAGNOSIS — Z3A38 38 weeks gestation of pregnancy: Secondary | ICD-10-CM

## 2018-06-29 DIAGNOSIS — B951 Streptococcus, group B, as the cause of diseases classified elsewhere: Secondary | ICD-10-CM

## 2018-06-29 DIAGNOSIS — O479 False labor, unspecified: Secondary | ICD-10-CM

## 2018-06-29 DIAGNOSIS — O98819 Other maternal infectious and parasitic diseases complicating pregnancy, unspecified trimester: Secondary | ICD-10-CM

## 2018-06-29 DIAGNOSIS — Z8669 Personal history of other diseases of the nervous system and sense organs: Secondary | ICD-10-CM

## 2018-06-29 NOTE — Progress Notes (Signed)
Subjective:  Colleen Stone is a 23 y.o. G2P0010 at [redacted]w[redacted]d being seen today for ongoing prenatal care.  She is currently monitored for the following issues for this high-risk pregnancy and has Supervision of high risk pregnancy, antepartum; Bipolar 1 disorder ([place]); Atypical squamous cells of undetermined significance (ASCUS) on Papanicolaou smear of cervix; Family history of breast cancer; Family history of schizophrenia; Smoker; Group B streptococcal infection during pregnancy; Obesity affecting pregnancy in second trimester; Fetal cardiac echogenic focus; Obesity in pregnancy, antepartum; Late entry babyscripts optimization ; Chronic hypertension affecting pregnancy; and History of seizure disorder on their problem list.  Patient reports general discomforts of pregnancy.  Contractions: Irregular. Vag. Bleeding: None.  Movement: Present. Denies leaking of fluid.   The following portions of the patient's history were reviewed and updated as appropriate: allergies, current medications, past family history, past medical history, past social history, past surgical history and problem list. Problem list updated.  Objective:   Vitals:   06/29/18 1030  BP: 126/75  Pulse: (!) 117  Weight: 284 lb 14.4 oz (129.2 kg)    Fetal Status: Fetal Heart Rate (bpm): NST   Movement: Present     General:  Alert, oriented and cooperative. Patient is in no acute distress.  Skin: Skin is warm and dry. No rash noted.   Cardiovascular: Normal heart rate noted  Respiratory: Normal respiratory effort, no problems with respiration noted  Abdomen: Soft, gravid, appropriate for gestational age. Pain/Pressure: Present     Pelvic:  Cervical exam performed        Extremities: Normal range of motion.     Mental Status: Normal mood and affect. Normal behavior. Normal judgment and thought content.   Urinalysis:      Assessment and Plan:  Pregnancy: G2P0010 at [redacted]w[redacted]d  1. Supervision of high risk pregnancy in third  trimester Stable Labor precautions  2. Chronic hypertension affecting pregnancy BP stable  BPP 10/10 today Continue with current management IOL scheduled at 39 weeks  3. History of seizure disorder   4. Group B streptococcal infection during pregnancy Tx while in labor  Term labor symptoms and general obstetric precautions including but not limited to vaginal bleeding, contractions, leaking of fluid and fetal movement were reviewed in detail with the patient. Please refer to After Visit Summary for other counseling recommendations.  No follow-ups on file.   Chancy Milroy, MD

## 2018-06-29 NOTE — Patient Instructions (Signed)

## 2018-06-29 NOTE — Progress Notes (Signed)
Pt reports frequent episodes of dizziness - including today. She also has occasional H/A - none @ present.  IOL scheduled on 6/21  Pt informed that the ultrasound is considered a limited OB ultrasound and is not intended to be a complete ultrasound exam.  Patient also informed that the ultrasound is not being completed with the intent of assessing for fetal or placental anomalies or any pelvic abnormalities.  Explained that the purpose of today's ultrasound is to assess for presentation, BPP and amniotic fluid volume.  Patient acknowledges the purpose of the exam and the limitations of the study.

## 2018-06-29 NOTE — MAU Note (Signed)
I have communicated with F. Cresenzo dishmon CNM and reviewed vital signs:  108/ 68 108 98.1 18  Vaginal exam:  Dilation: 3 Effacement (%): 70 Cervical Position: Posterior Station: -3 Presentation: Vertex Exam by:: Joya Gaskins RN,   Also reviewed contraction pattern and that non-stress test is reactive.  It has been documented that patient is contracting rarely with no cervical change since office appt this morning not indicating active labor.  Patient denies any other complaints.  Based on this report provider has given order for discharge.  A discharge order and diagnosis entered by a provider.   Labor discharge instructions reviewed with patient.

## 2018-07-01 ENCOUNTER — Other Ambulatory Visit: Payer: Self-pay | Admitting: Women's Health

## 2018-07-02 ENCOUNTER — Other Ambulatory Visit: Payer: Self-pay

## 2018-07-02 ENCOUNTER — Other Ambulatory Visit (HOSPITAL_COMMUNITY)
Admission: RE | Admit: 2018-07-02 | Discharge: 2018-07-02 | Disposition: A | Discharge status: Home / Self Care | Payer: Medicaid Other | Source: Ambulatory Visit | Attending: Obstetrics and Gynecology | Admitting: Obstetrics and Gynecology

## 2018-07-02 DIAGNOSIS — Z1159 Encounter for screening for other viral diseases: Secondary | ICD-10-CM | POA: Diagnosis present

## 2018-07-02 LAB — SARS CORONAVIRUS 2 (TAT 6-24 HRS): SARS Coronavirus 2: NEGATIVE

## 2018-07-03 ENCOUNTER — Encounter (HOSPITAL_COMMUNITY): Payer: Self-pay | Admitting: *Deleted

## 2018-07-03 ENCOUNTER — Inpatient Hospital Stay (HOSPITAL_COMMUNITY)
Admission: AD | Admit: 2018-07-03 | Discharge: 2018-07-03 | Disposition: A | Discharge status: Home / Self Care | Payer: Medicaid Other | Attending: Obstetrics and Gynecology | Admitting: Obstetrics and Gynecology

## 2018-07-03 ENCOUNTER — Other Ambulatory Visit: Payer: Self-pay

## 2018-07-03 DIAGNOSIS — O471 False labor at or after 37 completed weeks of gestation: Secondary | ICD-10-CM | POA: Diagnosis not present

## 2018-07-03 DIAGNOSIS — O479 False labor, unspecified: Secondary | ICD-10-CM

## 2018-07-03 DIAGNOSIS — Z3A38 38 weeks gestation of pregnancy: Secondary | ICD-10-CM | POA: Diagnosis not present

## 2018-07-03 HISTORY — DX: Methicillin resistant Staphylococcus aureus infection, unspecified site: A49.02

## 2018-07-03 NOTE — Discharge Instructions (Signed)
Fetal Movement Counts °Patient Name: ________________________________________________ Patient Due Date: ____________________ °What is a fetal movement count? ° °A fetal movement count is the number of times that you feel your baby move during a certain amount of time. This may also be called a fetal kick count. A fetal movement count is recommended for every pregnant woman. You may be asked to start counting fetal movements as early as week 28 of your pregnancy. °Pay attention to when your baby is most active. You may notice your baby's sleep and wake cycles. You may also notice things that make your baby move more. You should do a fetal movement count: °· When your baby is normally most active. °· At the same time each day. °A good time to count movements is while you are resting, after having something to eat and drink. °How do I count fetal movements? °1. Find a quiet, comfortable area. Sit, or lie down on your side. °2. Write down the date, the start time and stop time, and the number of movements that you felt between those two times. Take this information with you to your health care visits. °3. For 2 hours, count kicks, flutters, swishes, rolls, and jabs. You should feel at least 10 movements during 2 hours. °4. You may stop counting after you have felt 10 movements. °5. If you do not feel 10 movements in 2 hours, have something to eat and drink. Then, keep resting and counting for 1 hour. If you feel at least 4 movements during that hour, you may stop counting. °Contact a health care provider if: °· You feel fewer than 4 movements in 2 hours. °· Your baby is not moving like he or she usually does. °Date: ____________ Start time: ____________ Stop time: ____________ Movements: ____________ °Date: ____________ Start time: ____________ Stop time: ____________ Movements: ____________ °Date: ____________ Start time: ____________ Stop time: ____________ Movements: ____________ °Date: ____________ Start time:  ____________ Stop time: ____________ Movements: ____________ °Date: ____________ Start time: ____________ Stop time: ____________ Movements: ____________ °Date: ____________ Start time: ____________ Stop time: ____________ Movements: ____________ °Date: ____________ Start time: ____________ Stop time: ____________ Movements: ____________ °Date: ____________ Start time: ____________ Stop time: ____________ Movements: ____________ °Date: ____________ Start time: ____________ Stop time: ____________ Movements: ____________ °This information is not intended to replace advice given to you by your health care provider. Make sure you discuss any questions you have with your health care provider. °Document Released: 01/30/2006 Document Revised: 08/30/2015 Document Reviewed: 02/09/2015 °Elsevier Interactive Patient Education © 2019 [employer] °Signs and Symptoms of Labor °Labor is your body's natural process of moving your baby, placenta, and umbilical cord out of your uterus. The process of labor usually starts when your baby is full-term, between 37 and 40 weeks of pregnancy. °How will I know when I am close to going into labor? °As your body prepares for labor and the birth of your baby, you may notice the following symptoms in the weeks and days before true labor starts: °· Having a strong desire to get your home ready to receive your new baby. This is called nesting. Nesting may be a sign that labor is approaching, and it may occur several weeks before birth. Nesting may involve cleaning and organizing your home. °· Passing a small amount of thick, bloody mucus out of your vagina (normal bloody show or losing your mucus plug). This may happen more than a week before labor begins, or it might occur right before labor begins as the opening of the cervix   starts to widen (dilate). For some women, the entire mucus plug passes at once. For others, smaller portions of the mucus plug may gradually pass over several  days. °· Your baby moving (dropping) lower in your pelvis to get into position for birth (lightening). When this happens, you may feel more pressure on your bladder and pelvic bone and less pressure on your ribs. This may make it easier to breathe. It may also cause you to need to urinate more often and have problems with bowel movements. °· Having "practice contractions" (Braxton Hicks contractions) that occur at irregular (unevenly spaced) intervals that are more than 10 minutes apart. This is also called false labor. False labor contractions are common after exercise or sexual activity, and they will stop if you change position, rest, or drink fluids. These contractions are usually mild and do not get stronger over time. They may feel like: °? A backache or back pain. °? Mild cramps, similar to menstrual cramps. °? Tightening or pressure in your abdomen. °Other early symptoms that labor may be starting soon include: °· Nausea or loss of appetite. °· Diarrhea. °· Having a sudden burst of energy, or feeling very tired. °· Mood changes. °· Having trouble sleeping. °How will I know when labor has begun? °Signs that true labor has begun may include: °· Having contractions that come at regular (evenly spaced) intervals and increase in intensity. This may feel like more intense tightening or pressure in your abdomen that moves to your back. °? Contractions may also feel like rhythmic pain in your upper thighs or back that comes and goes at regular intervals. °? For first-time mothers, this change in intensity of contractions often occurs at a more gradual pace. °? Women who have given birth before may notice a more rapid progression of contraction changes. °· Having a feeling of pressure in the vaginal area. °· Your water breaking (rupture of membranes). This is when the sac of fluid that surrounds your baby breaks. When this happens, you will notice fluid leaking from your vagina. This may be clear or blood-tinged.  Labor usually starts within 24 hours of your water breaking, but it may take longer to begin. °? Some women notice this as a gush of fluid. °? Others notice that their underwear repeatedly becomes damp. °Follow these instructions at home: ° °· When labor starts, or if your water breaks, call your health care provider or nurse care line. Based on your situation, they will determine when you should go in for an exam. °· When you are in early labor, you may be able to rest and manage symptoms at home. Some strategies to try at home include: °? Breathing and relaxation techniques. °? Taking a warm bath or shower. °? Listening to music. °? Using a heating pad on the lower back for pain. If you are directed to use heat: °§ Place a towel between your skin and the heat source. °§ Leave the heat on for 20-30 minutes. °§ Remove the heat if your skin turns bright red. This is especially important if you are unable to feel pain, heat, or cold. You may have a greater risk of getting burned. °Get help right away if: °· You have painful, regular contractions that are 5 minutes apart or less. °· Labor starts before you are [redacted] weeks along in your pregnancy. °· You have a fever. °· You have a headache that does not go away. °· You have bright red blood coming from your vagina. °·   You do not feel your baby moving. °· You have a sudden onset of: °? Severe headache with vision problems. °? Nausea, vomiting, or diarrhea. °? Chest pain or shortness of breath. °These symptoms may be an emergency. If your health care provider recommends that you go to the hospital or birth center where you plan to deliver, do not drive yourself. Have someone else drive you, or call emergency services (911 in the [place] °Summary °· Labor is your body's natural process of moving your baby, placenta, and umbilical cord out of your uterus. °· The process of labor usually starts when your baby is full-term, between 37 and 40 weeks of pregnancy. °· When labor  starts, or if your water breaks, call your health care provider or nurse care line. Based on your situation, they will determine when you should go in for an exam. °This information is not intended to replace advice given to you by your health care provider. Make sure you discuss any questions you have with your health care provider. °Document Released: 06/07/2016 Document Revised: 06/07/2016 Document Reviewed: 06/07/2016 °Elsevier Interactive Patient Education © 2019 [employer]vier [employer]. ° °

## 2018-07-03 NOTE — MAU Provider Note (Signed)
S: Patient is here for RN labor evaluation. Strip, vital signs, & chart Reviewed   O:  Vitals:   07/03/18 1046 07/03/18 1112 07/03/18 1255  BP: 131/67 118/74 116/80  Pulse: (!) 121 (!) 124 (!) 107  Resp: 18    Temp: 97.8 F (36.6 C)    TempSrc: Oral    SpO2: 100%    Weight: 129.5 kg    Height: 5' 4.5" (1.638 m)     No results found for this or any previous visit (from the past 24 hour(s)).  Dilation: 3 Effacement (%): 60 Cervical Position: Posterior Station: Ballotable Exam by:: jolynn   FHR: 130 bpm, Mod Var, no Decels, 15x15 Accels UC: irregular   A: 1. False labor   2. [redacted] weeks gestation of pregnancy      P:  RN to discharge home in stable condition with return precautions & fetal kick counts  Jorje Guild FNP 1:03 PM

## 2018-07-03 NOTE — MAU Note (Signed)
Started contracting at 0700, coming about every 5 min. Some mucous d/c- brownish tint to it. No active bleeding or leaking.  Was 3+ at last appt.

## 2018-07-05 ENCOUNTER — Inpatient Hospital Stay (HOSPITAL_COMMUNITY): Payer: Medicaid Other | Admitting: Anesthesiology

## 2018-07-05 ENCOUNTER — Encounter (HOSPITAL_COMMUNITY): Payer: Self-pay

## 2018-07-05 ENCOUNTER — Other Ambulatory Visit: Payer: Self-pay

## 2018-07-05 ENCOUNTER — Other Ambulatory Visit: Payer: Self-pay | Admitting: Family Medicine

## 2018-07-05 ENCOUNTER — Inpatient Hospital Stay (HOSPITAL_COMMUNITY)
Admission: RE | Admit: 2018-07-05 | Discharge: 2018-07-07 | DRG: 807 | Disposition: A | Discharge status: Home / Self Care | Payer: Medicaid Other | Attending: Obstetrics & Gynecology | Admitting: Obstetrics & Gynecology

## 2018-07-05 ENCOUNTER — Inpatient Hospital Stay (HOSPITAL_COMMUNITY): Payer: Medicaid Other

## 2018-07-05 DIAGNOSIS — F17200 Nicotine dependence, unspecified, uncomplicated: Secondary | ICD-10-CM | POA: Diagnosis present

## 2018-07-05 DIAGNOSIS — O9902 Anemia complicating childbirth: Secondary | ICD-10-CM | POA: Diagnosis present

## 2018-07-05 DIAGNOSIS — B951 Streptococcus, group B, as the cause of diseases classified elsewhere: Secondary | ICD-10-CM | POA: Diagnosis present

## 2018-07-05 DIAGNOSIS — O1002 Pre-existing essential hypertension complicating childbirth: Principal | ICD-10-CM | POA: Diagnosis present

## 2018-07-05 DIAGNOSIS — D649 Anemia, unspecified: Secondary | ICD-10-CM | POA: Diagnosis present

## 2018-07-05 DIAGNOSIS — Z30017 Encounter for initial prescription of implantable subdermal contraceptive: Secondary | ICD-10-CM

## 2018-07-05 DIAGNOSIS — Z8614 Personal history of Methicillin resistant Staphylococcus aureus infection: Secondary | ICD-10-CM

## 2018-07-05 DIAGNOSIS — Z87891 Personal history of nicotine dependence: Secondary | ICD-10-CM | POA: Diagnosis not present

## 2018-07-05 DIAGNOSIS — Z8669 Personal history of other diseases of the nervous system and sense organs: Secondary | ICD-10-CM

## 2018-07-05 DIAGNOSIS — O99214 Obesity complicating childbirth: Secondary | ICD-10-CM | POA: Diagnosis present

## 2018-07-05 DIAGNOSIS — F319 Bipolar disorder, unspecified: Secondary | ICD-10-CM | POA: Diagnosis present

## 2018-07-05 DIAGNOSIS — O10912 Unspecified pre-existing hypertension complicating pregnancy, second trimester: Secondary | ICD-10-CM | POA: Diagnosis present

## 2018-07-05 DIAGNOSIS — Z3A39 39 weeks gestation of pregnancy: Secondary | ICD-10-CM

## 2018-07-05 DIAGNOSIS — O99824 Streptococcus B carrier state complicating childbirth: Secondary | ICD-10-CM | POA: Diagnosis present

## 2018-07-05 DIAGNOSIS — O99344 Other mental disorders complicating childbirth: Secondary | ICD-10-CM | POA: Diagnosis present

## 2018-07-05 DIAGNOSIS — Z3403 Encounter for supervision of normal first pregnancy, third trimester: Secondary | ICD-10-CM

## 2018-07-05 DIAGNOSIS — O0990 Supervision of high risk pregnancy, unspecified, unspecified trimester: Secondary | ICD-10-CM

## 2018-07-05 DIAGNOSIS — R87610 Atypical squamous cells of undetermined significance on cytologic smear of cervix (ASC-US): Secondary | ICD-10-CM | POA: Diagnosis present

## 2018-07-05 DIAGNOSIS — O10919 Unspecified pre-existing hypertension complicating pregnancy, unspecified trimester: Secondary | ICD-10-CM | POA: Diagnosis present

## 2018-07-05 LAB — TYPE AND SCREEN
ABO/RH(D): A POS
Antibody Screen: NEGATIVE

## 2018-07-05 LAB — CBC WITH DIFFERENTIAL/PLATELET
Abs Immature Granulocytes: 0.03 10*3/uL (ref 0.00–0.07)
Basophils Absolute: 0 10*3/uL (ref 0.0–0.1)
Basophils Relative: 0 %
Eosinophils Absolute: 0 10*3/uL (ref 0.0–0.5)
Eosinophils Relative: 0 %
HCT: 32.8 % — ABNORMAL LOW (ref 36.0–46.0)
Hemoglobin: 10.6 g/dL — ABNORMAL LOW (ref 12.0–15.0)
Immature Granulocytes: 0 %
Lymphocytes Relative: 19 %
Lymphs Abs: 2.2 10*3/uL (ref 0.7–4.0)
MCH: 25.1 pg — ABNORMAL LOW (ref 26.0–34.0)
MCHC: 32.3 g/dL (ref 30.0–36.0)
MCV: 77.7 fL — ABNORMAL LOW (ref 80.0–100.0)
Monocytes Absolute: 0.9 10*3/uL (ref 0.1–1.0)
Monocytes Relative: 8 %
Neutro Abs: 8.2 10*3/uL — ABNORMAL HIGH (ref 1.7–7.7)
Neutrophils Relative %: 73 %
Platelets: 314 10*3/uL (ref 150–400)
RBC: 4.22 MIL/uL (ref 3.87–5.11)
RDW: 15.8 % — ABNORMAL HIGH (ref 11.5–15.5)
WBC: 11.4 10*3/uL — ABNORMAL HIGH (ref 4.0–10.5)
nRBC: 0 % (ref 0.0–0.2)

## 2018-07-05 LAB — CBC
HCT: 32.8 % — ABNORMAL LOW (ref 36.0–46.0)
Hemoglobin: 10.8 g/dL — ABNORMAL LOW (ref 12.0–15.0)
MCH: 25 pg — ABNORMAL LOW (ref 26.0–34.0)
MCHC: 32.9 g/dL (ref 30.0–36.0)
MCV: 75.9 fL — ABNORMAL LOW (ref 80.0–100.0)
Platelets: 342 10*3/uL (ref 150–400)
RBC: 4.32 MIL/uL (ref 3.87–5.11)
RDW: 15.3 % (ref 11.5–15.5)
WBC: 10.4 10*3/uL (ref 4.0–10.5)
nRBC: 0 % (ref 0.0–0.2)

## 2018-07-05 LAB — RPR: RPR Ser Ql: NONREACTIVE

## 2018-07-05 MED ORDER — ONDANSETRON HCL 4 MG/2ML IJ SOLN
4.0000 mg | Freq: Four times a day (QID) | INTRAMUSCULAR | Status: DC | PRN
  Administered 2018-07-05: 20:00:00 4 mg via INTRAVENOUS
  Filled 2018-07-05: qty 2

## 2018-07-05 MED ORDER — OXYCODONE-ACETAMINOPHEN 5-325 MG PO TABS: 1.0000 | ORAL_TABLET | ORAL | Status: DC | PRN

## 2018-07-05 MED ORDER — WITCH HAZEL-GLYCERIN EX PADS: 1.0000 "application " | MEDICATED_PAD | CUTANEOUS | Status: DC | PRN

## 2018-07-05 MED ORDER — DIBUCAINE (PERIANAL) 1 % EX OINT: 1.0000 "application " | TOPICAL_OINTMENT | CUTANEOUS | Status: DC | PRN

## 2018-07-05 MED ORDER — OXYTOCIN 40 UNITS IN NORMAL SALINE INFUSION - SIMPLE MED
1.0000 m[IU]/min | INTRAVENOUS | Status: DC
  Administered 2018-07-05: 2 m[IU]/min via INTRAVENOUS

## 2018-07-05 MED ORDER — PHENYLEPHRINE 40 MCG/ML (10ML) SYRINGE FOR IV PUSH (FOR BLOOD PRESSURE SUPPORT): 80.0000 ug | PREFILLED_SYRINGE | INTRAVENOUS | Status: DC | PRN

## 2018-07-05 MED ORDER — OXYTOCIN BOLUS FROM INFUSION
500.0000 mL | Freq: Once | INTRAVENOUS | Status: AC
  Administered 2018-07-05: 500 mL via INTRAVENOUS

## 2018-07-05 MED ORDER — EPHEDRINE 5 MG/ML INJ: 10.0000 mg | INTRAVENOUS | Status: DC | PRN

## 2018-07-05 MED ORDER — OXYTOCIN 40 UNITS IN NORMAL SALINE INFUSION - SIMPLE MED
2.5000 [IU]/h | INTRAVENOUS | Status: DC
  Administered 2018-07-05: 2.5 [IU]/h via INTRAVENOUS
  Filled 2018-07-05: qty 1000

## 2018-07-05 MED ORDER — TERBUTALINE SULFATE 1 MG/ML IJ SOLN: 0.2500 mg | Freq: Once | INTRAMUSCULAR | Status: DC | PRN

## 2018-07-05 MED ORDER — LIDOCAINE HCL (PF) 1 % IJ SOLN
INTRAMUSCULAR | Status: DC | PRN
  Administered 2018-07-05: 5 mL via EPIDURAL
  Administered 2018-07-05: 4 mL via EPIDURAL

## 2018-07-05 MED ORDER — BENZOCAINE-MENTHOL 20-0.5 % EX AERO
1.0000 "application " | INHALATION_SPRAY | CUTANEOUS | Status: DC | PRN
  Administered 2018-07-06: 1 via TOPICAL
  Filled 2018-07-05: qty 56

## 2018-07-05 MED ORDER — COCONUT OIL OIL: 1.0000 "application " | TOPICAL_OIL | Status: DC | PRN

## 2018-07-05 MED ORDER — LIDOCAINE HCL (PF) 1 % IJ SOLN
30.0000 mL | INTRAMUSCULAR | Status: AC | PRN
  Administered 2018-07-05: 18:00:00 30 mL via SUBCUTANEOUS
  Filled 2018-07-05: qty 30

## 2018-07-05 MED ORDER — OXYCODONE-ACETAMINOPHEN 5-325 MG PO TABS: 2.0000 | ORAL_TABLET | ORAL | Status: DC | PRN

## 2018-07-05 MED ORDER — PENICILLIN G 3 MILLION UNITS IVPB - SIMPLE MED
3.0000 10*6.[IU] | INTRAVENOUS | Status: DC
  Administered 2018-07-05 (×2): 3 10*6.[IU] via INTRAVENOUS
  Filled 2018-07-05 (×2): qty 100

## 2018-07-05 MED ORDER — ACETAMINOPHEN 325 MG PO TABS: 650.0000 mg | ORAL_TABLET | ORAL | Status: DC | PRN

## 2018-07-05 MED ORDER — SODIUM CHLORIDE 0.9 % IV SOLN
5.0000 10*6.[IU] | Freq: Once | INTRAVENOUS | Status: AC
  Administered 2018-07-05: 5 10*6.[IU] via INTRAVENOUS
  Filled 2018-07-05: qty 5

## 2018-07-05 MED ORDER — LACTATED RINGERS IV SOLN
INTRAVENOUS | Status: DC
  Administered 2018-07-05 (×2): via INTRAVENOUS

## 2018-07-05 MED ORDER — FENTANYL CITRATE (PF) 100 MCG/2ML IJ SOLN
50.0000 ug | INTRAMUSCULAR | Status: DC | PRN
  Administered 2018-07-05: 100 ug via INTRAVENOUS
  Filled 2018-07-05: qty 2

## 2018-07-05 MED ORDER — IBUPROFEN 600 MG PO TABS
600.0000 mg | ORAL_TABLET | Freq: Four times a day (QID) | ORAL | Status: DC
  Administered 2018-07-06 – 2018-07-07 (×6): 600 mg via ORAL
  Filled 2018-07-05 (×7): qty 1

## 2018-07-05 MED ORDER — FENTANYL-BUPIVACAINE-NACL 0.5-0.125-0.9 MG/250ML-% EP SOLN: 12.0000 mL/h | EPIDURAL | Status: DC | PRN

## 2018-07-05 MED ORDER — LABETALOL HCL 200 MG PO TABS
200.0000 mg | ORAL_TABLET | Freq: Two times a day (BID) | ORAL | Status: DC
  Administered 2018-07-05 – 2018-07-07 (×5): 200 mg via ORAL
  Filled 2018-07-05 (×5): qty 1

## 2018-07-05 MED ORDER — AMLODIPINE BESYLATE 5 MG PO TABS
5.0000 mg | ORAL_TABLET | Freq: Every day | ORAL | Status: DC
  Administered 2018-07-05 – 2018-07-07 (×3): 5 mg via ORAL
  Filled 2018-07-05 (×3): qty 1

## 2018-07-05 MED ORDER — FENTANYL-BUPIVACAINE-NACL 0.5-0.125-0.9 MG/250ML-% EP SOLN
EPIDURAL | Status: AC
  Filled 2018-07-05: qty 250

## 2018-07-05 MED ORDER — ONDANSETRON HCL 4 MG/2ML IJ SOLN: 4.0000 mg | INTRAMUSCULAR | Status: DC | PRN

## 2018-07-05 MED ORDER — SOD CITRATE-CITRIC ACID 500-334 MG/5ML PO SOLN: 30.0000 mL | ORAL | Status: DC | PRN

## 2018-07-05 MED ORDER — LACTATED RINGERS IV SOLN
500.0000 mL | Freq: Once | INTRAVENOUS | Status: AC
  Administered 2018-07-05: 13:00:00 500 mL via INTRAVENOUS

## 2018-07-05 MED ORDER — PANTOPRAZOLE SODIUM 20 MG PO TBEC
20.0000 mg | DELAYED_RELEASE_TABLET | Freq: Two times a day (BID) | ORAL | Status: DC
  Administered 2018-07-05 – 2018-07-07 (×5): 20 mg via ORAL
  Filled 2018-07-05 (×6): qty 1

## 2018-07-05 MED ORDER — DIPHENHYDRAMINE HCL 50 MG/ML IJ SOLN: 12.5000 mg | INTRAMUSCULAR | Status: DC | PRN

## 2018-07-05 MED ORDER — MISOPROSTOL 25 MCG QUARTER TABLET
25.0000 ug | ORAL_TABLET | ORAL | Status: DC | PRN
  Administered 2018-07-05: 09:00:00 25 ug via VAGINAL
  Filled 2018-07-05: qty 1

## 2018-07-05 MED ORDER — SODIUM CHLORIDE (PF) 0.9 % IJ SOLN
INTRAMUSCULAR | Status: DC | PRN
  Administered 2018-07-05: 12 mL/h via EPIDURAL

## 2018-07-05 MED ORDER — ONDANSETRON HCL 4 MG PO TABS: 4.0000 mg | ORAL_TABLET | ORAL | Status: DC | PRN

## 2018-07-05 MED ORDER — LACTATED RINGERS IV SOLN: 500.0000 mL | INTRAVENOUS | Status: DC | PRN

## 2018-07-05 MED ORDER — SIMETHICONE 80 MG PO CHEW: 80.0000 mg | CHEWABLE_TABLET | ORAL | Status: DC | PRN

## 2018-07-05 MED ORDER — TETANUS-DIPHTH-ACELL PERTUSSIS 5-2.5-18.5 LF-MCG/0.5 IM SUSP: 0.5000 mL | Freq: Once | INTRAMUSCULAR | Status: DC

## 2018-07-05 NOTE — Progress Notes (Signed)
Colleen Stone is a 23 y.o. G2P0010 at [redacted]w[redacted]d admitted for induction of labor due to [facility].  Subjective:  Feeling a lot more uncomfortable. Had fentanyl for pain, but reports that it did not give a lot of relief. Requesting epidural.   Objective: BP 117/67   Pulse 87   Temp 98.7 F (37.1 C) (Oral)   Resp 16   Ht 5\' 4"  (1.626 m)   Wt 130.7 kg   LMP 08/26/2017 (Exact Date)   BMI 49.47 kg/m  No intake/output data recorded. No intake/output data recorded.  FHT:  FHR: 140 bpm, variability: moderate,  accelerations:  Present,  decelerations:  Absent UC:   regular, every 2-5 minutes SVE:   Dilation: 4 Effacement (%): 80 Station: -3 Exam by:: H.Rudell Marlowe,CNM  Labs: Lab Results  Component Value Date   WBC 10.4 07/05/2018   HGB 10.8 (L) 07/05/2018   HCT 32.8 (L) 07/05/2018   MCV 75.9 (L) 07/05/2018   PLT 342 07/05/2018    Assessment / Plan: IOL 2/2 CHTN. Cervical ripening phase completed   Labor: 1 dose of cytotec, will start pitocin now Preeclampsia:  NA Fetal Wellbeing:  Category I Pain Control:  IV pain meds and requesting epidural  I/D:  n/a Anticipated MOD:  NSVD  Marcille Buffy DNP, CNM  07/05/18  12:59 PM

## 2018-07-05 NOTE — Anesthesia Procedure Notes (Signed)
Epidural Patient location during procedure: OB Start time: 07/05/2018 1:12 PM End time: 07/05/2018 1:16 PM  Staffing Anesthesiologist: Audry Pili, MD Performed: anesthesiologist   Preanesthetic Checklist Completed: patient identified, pre-op evaluation, timeout performed, IV checked, risks and benefits discussed and monitors and equipment checked  Epidural Patient position: sitting Prep: DuraPrep Patient monitoring: continuous pulse ox and blood pressure Approach: midline Location: L3-L4 Injection technique: LOR saline  Needle:  Needle type: Tuohy  Needle gauge: 17 G Needle length: 9 cm Needle insertion depth: 9 cm Catheter size: 19 Gauge Catheter at skin depth: 15 cm Test dose: negative and Other (1% lidocaine)  Assessment Events: blood not aspirated  Additional Notes Patient identified. Risks including, but not limited to, bleeding, infection, nerve damage, paralysis, inadequate analgesia, blood pressure changes, nausea, vomiting, allergic reaction, postpartum back pain, itching, and headache were discussed. Patient expressed understanding and wished to proceed. Sterile prep and drape, including hand hygiene, mask, and sterile gloves were used. The patient was positioned and the spine was prepped. The skin was anesthetized with lidocaine. No paraesthesia or other complication noted. The patient did not experience any signs of intravascular injection such as tinnitus or metallic taste in mouth, nor signs of intrathecal spread such as rapid motor block. Please see nursing notes for vital signs. The patient tolerated the procedure well.   Renold Don, MDReason for block:procedure for pain

## 2018-07-05 NOTE — Progress Notes (Signed)
See Colleen Stone is a 23 y.o. G2P0010 at [redacted]w[redacted]d admitted for induction of labor due to Hypertension.  Subjective:  Coping well. Comfortable with epidural but feeling some rectal pressure with contractions. SROM of a large amount of fluid at 1610 Objective: BP 125/80   Pulse 92   Temp 97.9 F (36.6 C) (Oral)   Resp 16   Ht 5\' 4"  (1.626 m)   Wt 130.7 kg   LMP 08/26/2017 (Exact Date)   SpO2 100%   BMI 49.47 kg/m  No intake/output data recorded. No intake/output data recorded.  FHT:  FHR: 125 bpm, variability: moderate,  accelerations:  Present,  decelerations:  Absent UC:   regular, every 2 minutes SVE:   Dilation: Lip/rim Effacement (%): 90 Station: Plus 1 Exam by:: J.Follmer,RNC  Labs: Lab Results  Component Value Date   WBC 10.4 07/05/2018   HGB 10.8 (L) 07/05/2018   HCT 32.8 (L) 07/05/2018   MCV 75.9 (L) 07/05/2018   PLT 342 07/05/2018    Assessment / Plan: Induction of labor due to [facility]  progressing well on pitocin  Labor: Progressing normally Preeclampsia:  no symptoms  Fetal Wellbeing:  Category I Pain Control:  Epidural I/D:  n/a Anticipated MOD:  NSVD  Marcille Buffy DNP, CNM  07/05/18  5:56 PM

## 2018-07-05 NOTE — Plan of Care (Signed)

## 2018-07-05 NOTE — H&P (Signed)
Colleen Stone is a 23 y.o. female G2P0010 at 4635w0d presenting for IOL 2/2 CHTN. Currently on labetalol 200mg  BID. Hx of seizure disorder, no currently on medication and not currently experiencing any seizures. Routine prenatal care since 14 weeks. EDC confirmed by 6 week US. Normal anatomy scan.    OB History    Gravida  2   Para      Term      Preterm      AB  1   Living  0     SAB  1   TAB      Ectopic      Multiple      Live Births             Past Medical History:  Diagnosis Date  . Anxiety   . Asthma   . Chlamydia   . Gonorrhea   . Hypertension    takes labetalol  . MRSA infection    8th grade  . Other acne 06/30/2008   Overview:  Acne  . PCOS (polycystic ovarian syndrome)   . Seizures (HCC)    Last seizure in 2017  . Trichomonas infection    Past Surgical History:  Procedure Laterality Date  . ADENOIDECTOMY    . NASAL SEPTOPLASTY W/ TURBINOPLASTY    . ROOT CANAL    . TONSILLECTOMY    . WISDOM TOOTH EXTRACTION     Family History: family history includes Asthma in her mother; Heart disease in her father and mother; Hypertension in her mother. Social History:  reports that she quit smoking about 9 months ago. Her smoking use included cigarettes. She has never used smokeless tobacco. She reports previous alcohol use. She reports previous drug use. Drug: Marijuana.  Nursing Staff Provider  Office Location  [facility] Dating   early US at 6 weeks  Language  English Anatomy US   scheduled  Flu Vaccine  Decli ned 01/13/18 Genetic Screen  NIPS: low risk - female  AFP: declined   TDaP vaccine   04/20/2018 Hgb A1C or  GTT Early  Third trimester   Ref. Range 04/20/2018 09:19  Glucose, 1 hour Latest Ref Range: 65 - 179 mg/dL 161117  Glucose, Fasting Latest Ref Range: 65 - 91 mg/dL 82  Glucose, 2 hour Latest Ref Range: 65 - 152 mg/dL 096109    Rhogam   NA   LAB RESULTS   Feeding Plan Breast Blood Type A/Positive/-- (12/31 1121)   Contraception Patch Antibody  Negative (12/31 1121)  Circumcision yes Rubella 2.77 (12/31 1121)  Pediatrician  List given  RPR Non Reactive (12/31 1121)   Support Person Melinda(MOM) HBsAg Negative (12/31 1121)   Prenatal Classes Info given HIV Non Reactive (12/31 1121)  BTL Consent NA GBS  POSITIVE  VBAC Consent NA Pap  normal    Hgb Electro   normal    CF  negative    SMA  negative    Waterbirth  [ ]  Class [ ]  Consent [ ]  CNM visit       Maternal Diabetes: No Genetic Screening: Normal Maternal Ultrasounds/Referrals: Normal Fetal Ultrasounds or other Referrals:  None Maternal Substance Abuse:  Yes:  Type: Marijuana Significant Maternal Medications:  None Significant Maternal Lab Results:  Group B Strep positive Other Comments:  None  Review of Systems  All other systems reviewed and are negative.  Maternal Medical History:  Contractions: Frequency: irregular.   Perceived severity is mild.    Fetal activity: Perceived fetal activity is normal.  Prenatal complications: PIH.   Prenatal Complications - Diabetes: none.    Dilation: 3 Effacement (%): 60 Station: Ballotable Exam by:: J.Follmer,RNC Blood pressure 126/83, pulse (!) 108, resp. rate 16, height 5\' 4"  (1.626 m), weight 130.7 kg, last menstrual period 08/26/2017. Maternal Exam:  Abdomen: Fetal presentation: vertex     Fetal Exam Fetal Monitor Review: Mode: ultrasound.   Baseline rate: 130.  Variability: moderate (6-25 bpm).   Pattern: accelerations present and no decelerations.    Fetal State Assessment: Category I - tracings are normal.     Physical Exam  Nursing note and vitals reviewed. Constitutional: She is oriented to person, place, and time. She appears well-developed and well-nourished. No distress.  HENT:  Head: Normocephalic.  Cardiovascular: Normal rate.  Respiratory: Effort normal.  GI: Soft. There is no abdominal tenderness. There is no rebound.  Neurological: She is alert and oriented to person, place, and  time.  Skin: Skin is warm and dry.  Psychiatric: She has a normal mood and affect.     Pt informed that the ultrasound is considered a limited OB ultrasound and is not intended to be a complete ultrasound exam.  Patient also informed that the ultrasound is not being completed with the intent of assessing for fetal or placental anomalies or any pelvic abnormalities.  Explained that the purpose of today's ultrasound is to assess for  presentation.  Patient acknowledges the purpose of the exam and the limitations of the study.    VERTEX  Prenatal labs: ABO, Rh: --/--/A POS (06/21 7829) Antibody: NEG (06/21 5621) Rubella: 2.77 (12/31 1121) RPR: Non Reactive (04/06 0919)  HBsAg: Negative (12/31 1121)  HIV: Non Reactive (04/06 0919)  GBS:   POSITIVE (in urine)  Assessment/Plan: G2P0010 at [redacted]w[redacted]d  CHTN on labetalol 200mg  BID, continue this during IOL Cytotec and then pitocin PCN for GBS prophylaxis    Marcille Buffy DNP, CNM  07/05/18  8:52 AM

## 2018-07-05 NOTE — Anesthesia Preprocedure Evaluation (Signed)
Anesthesia Evaluation  Patient identified by MRN, date of birth, ID band Patient awake    Reviewed: Allergy & Precautions, NPO status , Patient's Chart, lab work & pertinent test results, reviewed documented beta blocker date and time   History of Anesthesia Complications Negative for: history of anesthetic complications  Airway Mallampati: II  TM Distance: >3 FB Neck ROM: Full    Dental  (+) Dental Advisory Given   Pulmonary asthma , former smoker,    Pulmonary exam normal        Cardiovascular hypertension, Pt. on medications and Pt. on home beta blockers Normal cardiovascular exam     Neuro/Psych Seizures -,  PSYCHIATRIC DISORDERS Anxiety Bipolar Disorder    GI/Hepatic negative GI ROS, Neg liver ROS,   Endo/Other  Morbid obesity  Renal/GU negative Renal ROS     Musculoskeletal negative musculoskeletal ROS (+)   Abdominal (+) + obese,   Peds  Hematology  (+) anemia ,   Anesthesia Other Findings   Reproductive/Obstetrics (+) Pregnancy  PCOS                              Anesthesia Physical Anesthesia Plan  ASA: III  Anesthesia Plan: Epidural   Post-op Pain Management:    Induction:   PONV Risk Score and Plan: 2 and Treatment may vary due to age or medical condition  Airway Management Planned: Natural Airway  Additional Equipment: None  Intra-op Plan:   Post-operative Plan:   Informed Consent: I have reviewed the patients History and Physical, chart, labs and discussed the procedure including the risks, benefits and alternatives for the proposed anesthesia with the patient or authorized representative who has indicated his/her understanding and acceptance.       Plan Discussed with: Anesthesiologist  Anesthesia Plan Comments: (Labs reviewed. Platelets acceptable, patient not taking any blood thinning medications. Per RN, FHR tracing reported to be stable enough for  sitting procedure. Risks and benefits discussed with patient, including PDPH, backache, epidural hematoma, failed epidural, allergic reaction, and nerve injury. Patient expressed understanding and wished to proceed.)        Anesthesia Quick Evaluation

## 2018-07-05 NOTE — Discharge Summary (Signed)
Postpartum Discharge Summary     Patient Name: Colleen RhodyBranisha Conerly DOB: Aug 04, 1995 MRN: [id number]  Date of admission: 07/05/2018 Delivering Provider: Thressa ShellerHOGAN, HEATHER D   Date of discharge: 07/07/2018  Admitting diagnosis: Pregnancy Intrauterine pregnancy: 275w0d     Secondary diagnosis:  Active Problems:   Bipolar 1 disorder (HCC)   Atypical squamous cells of undetermined significance (ASCUS) on Papanicolaou smear of cervix   Smoker   Group B streptococcal infection during pregnancy   Chronic hypertension affecting pregnancy   History of seizure disorder   SVD (spontaneous vaginal delivery)  Additional problems: None     Discharge diagnosis: Term Pregnancy Delivered and CHTN                                                                                                Post partum procedures:None  Augmentation: Pitocin and Cytotec  Complications: None  Hospital course:  Induction of Labor With Vaginal Delivery   23 y.o. yo G2P0010 at 6975w0d was admitted to the hospital 07/05/2018 for induction of labor.  Indication for induction: CHTN on meds .  Patient had an uncomplicated labor course as follows: Membrane Rupture Time/Date: 4:10 PM ,07/05/2018   Intrapartum Procedures: Episiotomy: None [1]                                         Lacerations:  1st degree [2];Vaginal [6]  Patient had delivery of a Viable infant.  Information for the patient's newborn:  Ronnald RampMcEachin, Girl Candis ShineBranisha [[id number]][[id number]]  Delivery Method: Vaginal, Spontaneous(Filed from Delivery Summary)    07/05/2018  Details of delivery can be found in separate delivery note.  Patient had a routine postpartum course. Patient is discharged home 07/07/18. Discharged with Norvasc 5mg  for BP control.   Magnesium Sulfate recieved: No BMZ received: No  Physical exam  Vitals:   07/06/18 1000 07/06/18 1300 07/06/18 2118 07/07/18 0559  BP: (!) 110/54 (!) 116/56 119/81 105/60  Pulse: 91 81 72 84  Resp: 18 18 18 18   Temp: 97.8 F  (36.6 C) 98.1 F (36.7 C) 97.9 F (36.6 C) 98 F (36.7 C)  TempSrc: Oral Oral Oral Oral  SpO2: 100%     Weight:      Height:       General: alert and cooperative Lochia: appropriate Uterine Fundus: firm Incision: N/A DVT Evaluation: No evidence of DVT seen on physical exam. Labs: Lab Results  Component Value Date   WBC 11.4 (H) 07/05/2018   HGB 10.6 (L) 07/05/2018   HCT 32.8 (L) 07/05/2018   MCV 77.7 (L) 07/05/2018   PLT 314 07/05/2018   CMP Latest Ref Rng & Units 06/09/2018  Glucose 65 - 99 mg/dL 69  BUN 6 - 20 mg/dL 5(L)  Creatinine 7.250.57 - 1.00 mg/dL 3.66(Y0.55(L)  Sodium 403134 - 474144 mmol/L 137  Potassium 3.5 - 5.2 mmol/L 4.7  Chloride 96 - 106 mmol/L 105  CO2 20 - 29 mmol/L 19(L)  Calcium 8.7 - 10.2 mg/dL 9.0  Total Protein 6.0 -  8.5 g/dL 5.9(L)  Total Bilirubin 0.0 - 1.2 mg/dL <0.2  Alkaline Phos 39 - 117 IU/L 171(H)  AST 0 - 40 IU/L 15  ALT 0 - 32 IU/L 12    Discharge instruction: per After Visit Summary and "Baby and Me Booklet".  After visit meds:  Allergies as of 07/07/2018      Reactions   Latex Hives, Swelling   Red Dye Nausea And Vomiting, Other (See Comments)   Her body rejects it      Medication List    TAKE these medications   amLODipine 5 MG tablet Commonly known as: NORVASC Take 1 tablet (5 mg total) by mouth daily.   aspirin EC 81 MG tablet Take 81 mg by mouth daily.   ibuprofen 600 MG tablet Commonly known as: ADVIL Take 1 tablet (600 mg total) by mouth every 6 (six) hours as needed.   labetalol 200 MG tablet Commonly known as: NORMODYNE Take 1 tablet (200 mg total) by mouth 2 (two) times daily.   prenatal multivitamin Tabs tablet Take 1 tablet by mouth daily at 12 noon.   promethazine 25 MG tablet Commonly known as: PHENERGAN Take 1 tablet (25 mg total) by mouth every 6 (six) hours as needed for nausea or vomiting.       Diet: routine diet  Activity: Advance as tolerated. Pelvic rest for 6 weeks.   Outpatient follow up:4  weeks Follow up Appt: Future Appointments  Date Time Provider [place]  08/03/2018  8:15 AM Virginia Rochester, NP [place] [facility]  08/05/2018  3:30 PM Fulp, Cammie, MD [facility] None   Follow up Visit: [facility] Follow up.   Why: go: July 22 nd at 3:30 with Dr. Antony Blackbird - PCP appointment-  Contact information: [address] [place] [phone]           Please schedule this patient for Postpartum visit in: 1 week with the following provider: RN for blood pressure check  For C/S patients schedule nurse incision check in weeks 2 weeks: no High risk pregnancy complicated by: HTN Delivery mode:  SVD Anticipated Birth Control:  Nexplanon PP Procedures needed: BP check  Schedule Integrated BH visit: no      Newborn Data: Live born female  Birth Weight:   APGAR: 61, 9  Newborn Delivery   Birth date/time: 07/05/2018 18:17:00 Delivery type: Vaginal, Spontaneous      Baby Feeding: Breast Disposition:home with mother   Melina Schools DNP, CNM  07/07/18  7:45 AM

## 2018-07-06 ENCOUNTER — Ambulatory Visit (HOSPITAL_COMMUNITY): Payer: Medicaid Other

## 2018-07-06 LAB — ABO/RH: ABO/RH(D): A POS

## 2018-07-06 NOTE — Anesthesia Postprocedure Evaluation (Signed)
Anesthesia Post Note  Patient: Colleen Stone  Procedure(s) Performed: AN AD [place]     Patient location during evaluation: Mother Baby Anesthesia Type: Epidural Level of consciousness: awake and alert Pain management: pain level controlled Vital Signs Assessment: post-procedure vital signs reviewed and stable Respiratory status: spontaneous breathing, nonlabored ventilation and respiratory function stable Cardiovascular status: stable Postop Assessment: no headache, no backache, epidural receding, no apparent nausea or vomiting, patient able to bend at knees, adequate PO intake and able to ambulate Anesthetic complications: no    Last Vitals:  Vitals:   07/06/18 0215 07/06/18 0632  BP: 111/77 115/63  Pulse: 90 71  Resp: 18 18  Temp: 36.8 C 36.5 C  SpO2:      Last Pain:  Vitals:   07/06/18 0632  TempSrc: Oral  PainSc: 0-No pain   Pain Goal:                   [employer]

## 2018-07-06 NOTE — Care Management Note (Signed)
Case Management Note  Patient Details  Name: Eniya Cannady MRN: [id number] Date of Birth: [birth year]  Subjective/Objective:                  Shalandra Leu is a 23 y.o. G2P0010 at 19w0dadmitted for induction of labor due to Hypertension  Action/Plan: Dc when medically stable   In-House Referral:   CSW  Discharge planning Services   PCP Additional Comments: CM met with patient and patient desires a PCP. Patient stated she [place] a [place]CP [place].  Gave patient a rSecretary/administratorfor PCP in the area. With permission CM [facility] nd at 3:30 with Dr. CAnder GasterFulp.(PCP). Patient has active Medicaid so barriers with getting any medications if needed.    GYong Channel RN 07/06/2018, 4:57 PM

## 2018-07-06 NOTE — Progress Notes (Signed)
Post Partum Day 1 Subjective: no complaints, up ad lib, voiding, tolerating PO and + flatus  Objective: Blood pressure (!) 110/54, pulse 91, temperature 97.8 F (36.6 C), temperature source Oral, resp. rate 18, height 5\' 4"  (1.626 m), weight 130.7 kg, last menstrual period 08/26/2017, SpO2 100 %, unknown if currently breastfeeding.  Physical Exam:  General: alert, cooperative and no distress Lochia: appropriate Uterine Fundus: firm Incision: n/a DVT Evaluation: No evidence of DVT seen on physical exam.  Recent Labs    07/05/18 0721 07/05/18 1934  HGB 10.8* 10.6*  HCT 32.8* 32.8*    Assessment/Plan: Plan for discharge tomorrow   LOS: 1 day   Jorje Guild 07/06/2018, 10:14 AM

## 2018-07-06 NOTE — Clinical Social Work Maternal (Signed)
CLINICAL SOCIAL WORK MATERNAL/CHILD NOTE  Patient Details  Name: Colleen Stone MRN: [id number] Date of Birth: 27-Jun-1995  Date:  07/06/2018  Clinical Social Worker Initiating Note:  Elijio Miles Date/Time: Initiated:  07/06/18/0934     Child's Name:  Harold Hedge   Biological Parents:  Mother, Father(Colleen Stone and Colleen Stone)   Need for Interpreter:  None   Reason for Referral:  Behavioral Health Concerns, Current Substance Use/Substance Use During Pregnancy    Address:  [address] [place] [address]    Phone number:  [phone] (home)     Additional phone number:   Household Members/Support Persons (HM/SP):       HM/SP Name Relationship DOB or Age  HM/SP -1        HM/SP -2        HM/SP -3        HM/SP -4        HM/SP -5        HM/SP -6        HM/SP -7        HM/SP -8          Natural Supports (not living in the home):  Immediate Family, Spouse/significant other, Parent, Extended Family   Professional Supports: None   Employment: Unemployed   Type of Work:     Education:  9 to 11 years   Homebound arranged:    Museum/gallery curator Resources:  Medicaid   Other Resources:  Physicist, medical (MOB provided with phone number to start [facility] process)   Cultural/Religious Considerations Which May Impact Care:    Strengths:   Ability to meet basic needs, Home prepared for child, Pediatrician chosen   Psychotropic Medications:         Pediatrician:     [facility]  Pediatrician List:   [place]      Pediatrician Fax Number:    Risk Factors/Current Problems:  Mental Health Concerns , Substance Use    Cognitive State:  Able to Concentrate , Alert , Insightful , Linear Thinking    Mood/Affect:  Bright , Calm , Comfortable , Happy , Relaxed , Interested    CSW Assessment: CSW received consult for history of  bipolar disorder and substance use in early pregnancy.  CSW met with MOB to offer support and complete assessment.    MOB propped up in bed cradling infant, when CSW entered the room. CSW noted that MOB was on the phone when CSW walked in and CSW offered to come back at a later time but MOB welcoming of visit. CSW received verbal permission from MOB to complete assessment and discuss anything with her sister on the phone. CSW explained reason for consult and MOB expressed understanding. MOB currently lives alone in [place] and states FOB is involved. MOB stated she has applied for food stamps and had started application process for [facility] prior to [place]. CSW informed MOB that [facility] would be able to assist over the telephone and was provided with number to reach out. CSW inquired about MOB's mental health history and MOB acknowledged having a history of PTSD, bipolar depression, and manic depression. MOB stated she was on medications prior to finding out she was pregnant and then discontinued them. MOB shared she experienced some depression in early pregnancy as she was in mourning over the loss of her aunt  and after her miscarriage. MOB reported she had a counselor that she saw for support and stated she has a really good support system consisting of FOB, her mother, her sister and her grandmother. CSW provided education regarding the baby blues period vs. perinatal mood disorders, discussed treatment and gave resources for mental health follow up if concerns arise.  CSW recommends self-evaluation during the postpartum time period using the New Mom Checklist from Postpartum Progress and encouraged MOB to contact a medical professional if symptoms are noted at any time. MOB denied any current SI, HI or DV and reported she was feeling "fine".  CSW inquired about MOB's substance use history and MOB stated she used THC prior to finding out she was pregnant and denied any usage since confirmation of pregnancy.  CSW informed MOB of Hospital Drug Policy and explained UDS and CDS were still pending but that a CPS report would be made, if warranted. MOB asked appropriate questions regarding process in the event CPS report was made. MOB denied any further questions or concerns regarding policy.  MOB confirmed having all essential items for infant once discharged and stated infant would be sleeping in a crib once home. CSW provided review of Sudden Infant Death Syndrome (SIDS) precautions and safe sleeping habits.    CSW Plan/Description:  No Further Intervention Required/No Barriers to Discharge, Sudden Infant Death Syndrome (SIDS) Education, Perinatal Mood and Anxiety Disorder (PMADs) Education, [place], CSW Will Continue to Monitor Umbilical Cord Tissue Drug Screen Results and Make Report if Foye Spurling, Center 07/06/2018, 10:01 AM

## 2018-07-07 DIAGNOSIS — Z30017 Encounter for initial prescription of implantable subdermal contraceptive: Secondary | ICD-10-CM

## 2018-07-07 DIAGNOSIS — O1002 Pre-existing essential hypertension complicating childbirth: Secondary | ICD-10-CM | POA: Diagnosis not present

## 2018-07-07 MED ORDER — AMLODIPINE BESYLATE 5 MG PO TABS: 5.0000 mg | ORAL_TABLET | Freq: Every day | ORAL | 0 refills | Status: DC

## 2018-07-07 MED ORDER — ETONOGESTREL 68 MG ~~LOC~~ IMPL
68.0000 mg | DRUG_IMPLANT | Freq: Once | SUBCUTANEOUS | Status: AC
  Administered 2018-07-07: 68 mg via SUBCUTANEOUS
  Filled 2018-07-07: qty 1

## 2018-07-07 MED ORDER — LIDOCAINE HCL 1 % IJ SOLN
0.0000 mL | Freq: Once | INTRAMUSCULAR | Status: AC | PRN
  Administered 2018-07-07: 10:00:00 20 mL via INTRADERMAL
  Filled 2018-07-07: qty 20

## 2018-07-07 MED ORDER — IBUPROFEN 600 MG PO TABS: 600.0000 mg | ORAL_TABLET | Freq: Four times a day (QID) | ORAL | 1 refills | Status: DC | PRN

## 2018-07-07 NOTE — Lactation Note (Signed)
This note was copied from a baby's chart. Lactation Consultation Note  Patient Name: Colleen Stone YJEHU'D Date: 07/07/2018  P1, 49 hour female infant. LC entered room mom and infant asleep.   Maternal Data    Feeding    LATCH Score                   Interventions    Lactation Tools Discussed/Used     Consult Status      Vicente Serene 07/07/2018, 3:14 AM

## 2018-07-07 NOTE — Lactation Note (Signed)
This note was copied from a baby's chart. Lactation Consultation Note:  P42, Mother has been breastfeeding well. Infant is at 4% wt loss. Infant has good out put.   Mother describes slight nipple discomfort. Mother has a positional strip on the left nipple.   Assist mother with latching infant on in cross cradle hold. Infant suckled with good depth and audible swallows. reveiwed hand expression and breast compression. Observed feeding for 10-15 mins.  Mother very glad to see all the colostrum .  Reviewed basic breastfeeding teaching.  Discussed cue base feeding and cluster feeding. Mother reports that infant fed most of the night.  Mother advised to continue to feed infant 8-12 or more times in 24 hours.   Discussed treatment and  Prevention of engorgement.  Mother very receptive to all teaching. Mother has [place] and is aware of all rescources.  She reports that her [facility] appt was canceled due to Covid 19. Advised mother to phone [facility] for another app.  Mother was given a harmony hand pump and comfort gels.   Encouraged mother to call [facility] office as needed for questions or concerns.    Patient Name: Girl Taira Knabe QASTM'H Date: 07/07/2018 Reason for consult: Initial assessment   Maternal Data    Feeding Feeding Type: Breast Fed  LATCH Score                   Interventions    Lactation Tools Discussed/Used     Consult Status      Darla Lesches 07/07/2018, 9:43 AM

## 2018-07-07 NOTE — Discharge Instructions (Signed)

## 2018-07-07 NOTE — Progress Notes (Addendum)
Patient ID: Colleen Stone, female   DOB: 15-Oct-1995, 23 y.o.   MRN: [id number] CONTRACEPTIVE PROCEDURE NOTE   [name] is a 23 y.o. G2P1011 PPD#2 SVD for Nexplanon insertion. No complaints.   BP 105/60   Pulse 84   Temp 98 F (36.7 C) (Oral)   Resp 18   Ht 5\' 4"  (1.626 m)   Wt 130.7 kg   LMP 08/26/2017 (Exact Date)   SpO2 100%   Breastfeeding   BMI 49.47 kg/m     Nexplanon Insertion Procedure Patient identified, informed consent performed, consent signed.   Patient does understand that irregular bleeding is a very common side effect of this medication. She was advised to have backup contraception for one week after placement. Pregnancy test in clinic today was negative.  Appropriate time out taken.  Patient's left arm was prepped and draped in the usual sterile fashion. The ruler used to measure and mark insertion area.  Patient was prepped with alcohol swab and then injected with 0.8 ml of 1% lidocaine.  She was prepped with betadine, Nexplanon removed from packaging,  Device confirmed in needle, then inserted full length of needle and withdrawn per handbook instructions. Nexplanon was able to palpated in the patient's arm; patient palpated the insert herself. There was minimal blood loss.  Patient insertion site covered with guaze and a pressure bandage to reduce any bruising.  The patient tolerated the procedure well and was given post procedure instructions. [facility] at [facility] visit, unless having problems and need to be seen in the office.  Nexplanon Lot#: [id number] / Expiration Date: 10/11/2020  Laury Deep, CNM  07/07/2018 9:55 AM

## 2018-07-28 ENCOUNTER — Telehealth: Payer: Self-pay | Admitting: General Practice

## 2018-07-28 NOTE — Telephone Encounter (Signed)
Received message on nurse voicemail line from Dene Gentry, [facility] Nurse, that she recently completed a home visit with the patient. Her Edinburgh score was 19 and the patient has a history of PTSD and Bipolar. She did stop her medications during pregnancy and is still not taking anything. Will route to [facility].

## 2018-07-29 ENCOUNTER — Ambulatory Visit (INDEPENDENT_AMBULATORY_CARE_PROVIDER_SITE_OTHER): Payer: Medicaid Other | Admitting: Clinical

## 2018-07-29 ENCOUNTER — Other Ambulatory Visit: Payer: Self-pay

## 2018-07-29 DIAGNOSIS — F3163 Bipolar disorder, current episode mixed, severe, without psychotic features: Secondary | ICD-10-CM | POA: Diagnosis not present

## 2018-07-29 NOTE — BH Specialist Note (Signed)
Integrated Behavioral Health via Telemedicine Video Visit  07/29/2018 Colleen Stone [id number]  Number of [place] visits: 1 Session Start time: 2:31  Session End time: 3:37 Total time: 1 hour  Referring Provider: Earlie Server, NP  Type of Visit: Video Patient/Family location: Home [facility] Provider location: [facility] All persons participating in visit: Patient Colleen Stone and [place]  Confirmed patient's address: Yes  Confirmed patient's phone number: Yes  Any changes to demographics: No   Confirmed patient's insurance: Yes  Any changes to patient's insurance: No   Discussed confidentiality: Yes   I connected with Colleen Stone  by a video enabled telemedicine application and verified that I am speaking with the correct person using two identifiers.     I discussed the limitations of evaluation and management by telemedicine and the availability of in person appointments.  I discussed that the purpose of this visit is to provide behavioral health care while limiting exposure to the novel coronavirus.   Discussed there is a possibility of technology failure and discussed alternative modes of communication if that failure occurs.  I discussed that engaging in this video visit, they consent to the provision of behavioral healthcare and the services will be billed under their insurance.  Patient and/or legal guardian expressed understanding and consented to video visit: Yes   PRESENTING CONCERNS: Patient and/or family reports the following symptoms/concerns: Pt states her primary concern today is feeling depressed and anxious as she adjusts to new motherhood, without medication for bipolar disorder since prior to pregnancy. Pt is sleeping less than 3 hours/night, and  forgetting to eat. No SI, no HI, no psychosis.  Pt was treated with Abilify, Klonopin and Depakote prior to pregnancy; has not had a seizure since 2017. Pt open to referral to  psychiatry to begin treatment and  [place] medication management, and talking through feelings today.  Duration of problem: symptom increase postpartum; ongoing ; Severity of problem: severe  STRENGTHS (Protective Factors/Coping Skills): Self-awareness and open to treatment  GOALS ADDRESSED: Patient will: 1.  Reduce symptoms of: anxiety, depression and mood instability  2.  Increase knowledge and/or ability of: healthy habits  3.  Demonstrate ability to: Increase healthy adjustment to current life circumstances, Increase adequate support systems for patient/family and Increase motivation to adhere to plan of care  INTERVENTIONS: Interventions utilized:  Sleep Hygiene, Psychoeducation and/or Health Education and Link to [employer] Standardized Assessments completed: GAD-7 and PHQ 9  ASSESSMENT: Patient currently experiencing Bipolar affective disorder, recurrent, mixed, without psychotic features.   Patient may benefit from psychoeducation and brief therapeutic interventions regarding coping with symptoms of depression and anxiety, and referral to psychiatry for Colleen Stone medication management.  Marland Kitchen  PLAN: 1. Follow up with behavioral health clinician on : One week 2. Behavioral recommendations:  -Accept referral to Psychiatry/[facility]  -Prioritize healthy sleep nightly for at least one week; sleep when baby sleeps after baby's nighttime bath (play sleep sounds: thunder/rain for self and baby nightly).  -Eat at least a small meal every 5 hours during daytime(include protein) -Consider attending at least one new mom online support group ([email or web address] or [email or web address].net); share information with FOB for new dad online support -Continue taking prenatal vitamin daily until postpartum visit 3. Referral(s): [place] (In Clinic), [place] (LME/Outside Clinic) and Community Resources:  New mom support  I discussed the assessment and  treatment plan with the patient and/or parent/guardian. They were provided an opportunity to ask questions and all were  answered. They agreed with the plan and demonstrated an understanding of the instructions.   They were advised to call back or seek an in-person evaluation if the symptoms worsen or if the condition fails to improve as anticipated.  Valetta CloseJamie C Shiree Stone

## 2018-08-03 ENCOUNTER — Encounter: Payer: Self-pay | Admitting: Nurse Practitioner

## 2018-08-03 ENCOUNTER — Telehealth (INDEPENDENT_AMBULATORY_CARE_PROVIDER_SITE_OTHER): Payer: Medicaid Other | Admitting: Nurse Practitioner

## 2018-08-03 ENCOUNTER — Other Ambulatory Visit: Payer: Self-pay

## 2018-08-03 DIAGNOSIS — I10 Essential (primary) hypertension: Secondary | ICD-10-CM

## 2018-08-03 DIAGNOSIS — Z3046 Encounter for surveillance of implantable subdermal contraceptive: Secondary | ICD-10-CM

## 2018-08-03 MED ORDER — VITAFOL ULTRA 29-0.6-0.4-200 MG PO CAPS: 1.0000 | ORAL_CAPSULE | Freq: Every day | ORAL | 11 refills | Status: DC

## 2018-08-03 NOTE — Progress Notes (Signed)
TELEHEALTH POSTPARTUM VIRTUAL VIDEO VISIT ENCOUNTER NOTE   Provider location: Center for [employer] [facility] at [facility] [place]   I connected with Colleen Stone on 08/03/18 at  8:15 AM EDT by MyChart Video Encounter at home and verified that I am speaking with the correct person using two identifiers.    I discussed the limitations, risks, security and privacy concerns of performing an evaluation and management service virtually and the availability of in person appointments. I also discussed with the patient that there may be a patient responsible charge related to this service. The patient expressed understanding and agreed to proceed.  Chief Complaint: Postpartum Visit  History of Present Illness: Colleen Stone is a 23 y.o. African-American G2P1011 being evaluated for postpartum followup.    She is s/p normal spontaneous vaginal delivery following induction for chronic hypertension on 07-05-18 at [redacted]w[redacted]d weeks; she was discharged to home on 07-07-18. Pregnancy complicated by chronic hypertension, bipolar and seizure disorder. Baby is doing well and continuing to breastfeed.  Complains of burning with urination that she has had since the birth.  There was one laceration that was repaired.  She thinks the burning is still coming from that.  Has not had intercourse.  Denies any vaginal discharge, itching or burning.  Still has very light spotting.  Had Nexplanon inserted during her hospital stay.  Vaginal bleeding or discharge: Yes  Intercourse: No  Contraception: Nexplanon Mode of feeding infant: Breast PP depression s/s: No  symptoms today but last week had score of 19 and was seen on a video visit by [facility] provider in the office.  Has an appointment at [facility] on 08-05-18 to see [facility] provider there to restart her on her medication for bipolar and seizure disorder.  Also needs [facility] to evaluate her blood pressure and treatment plan.   Any bowel or bladder issues: No   See note about burning above Pap smear: no abnormalities (date: 01-13-18 )  Review of Systems: Positive for burning with urination and slight spotting. Her 12 point review of systems is negative or as noted in the History of Present Illness.  Patient Active Problem List   Diagnosis Date Noted   Hypertension    History of seizure disorder 06/15/2018   Bipolar 1 disorder ([place]) 01/13/2018   Family history of schizophrenia 01/13/2018   Smoker 01/13/2018   Family history of breast cancer 08/31/2012    Medications Adelei Stewart had no medications administered during this visit. Current Outpatient Medications  Medication Sig Dispense Refill   amLODipine (NORVASC) 5 MG tablet Take 1 tablet (5 mg total) by mouth daily. 30 tablet 0   aspirin EC 81 MG tablet Take 81 mg by mouth daily.     ibuprofen (ADVIL) 600 MG tablet Take 1 tablet (600 mg total) by mouth every 6 (six) hours as needed. 30 tablet 1   labetalol (NORMODYNE) 200 MG tablet Take 1 tablet (200 mg total) by mouth 2 (two) times daily. 90 tablet 0   Prenatal Vit-Fe Fumarate-FA (PRENATAL MULTIVITAMIN) TABS tablet Take 1 tablet by mouth daily at 12 noon.     Prenat-Fe Poly-Methfol-FA-DHA (VITAFOL ULTRA) 29-0.6-0.4-200 MG CAPS Take 1 capsule by mouth daily. 30 capsule 11   No current facility-administered medications for this visit.     Allergies Latex and Red dye  Physical Exam:  BP 121/75    Pulse 89  Reviewed from Babyscripts General:  Alert, oriented and cooperative. Patient is in no acute distress.  Mental Status: Normal  mood and affect. Normal behavior. Normal judgment and thought content.   Respiratory: Normal respiratory effort noted, no problems with respiration noted  Rest of physical exam deferred due to type of encounter  PP Depression Screening:   Edinburgh Postnatal Depression Scale Screening Tool 08/03/2018 07/06/2018  I have been able to laugh and see the funny side of things. 0 0  I have looked  forward with enjoyment to things. 0 0  I have blamed myself unnecessarily when things went wrong. 0 1  I have been anxious or worried for no good reason. 0 2  I have felt scared or panicky for no good reason. 0 2  Things have been getting on top of me. 0 0  I have been so unhappy that I have had difficulty sleeping. 0 0  I have felt sad or miserable. 0 0  I have been so unhappy that I have been crying. 0 0  The thought of harming myself has occurred to me. 0 0  Edinburgh Postnatal Depression Scale Total 0 5     Assessment:Patient is a 23 y.o. G2P1011 who is 4 weeks postpartum from a normal spontaneous vaginal delivery.  She is doing well.   Plan: Continue Nexplanon.  Call the clinic if you are having problems with vaginal bleeding.  Medication can help control the bleeding if it becomes a problem. Burning with urination - expect that to resolve in a couple of weeks if it is related to the stitches you had at birth.  Come into the clinic for self swabs for vaginal infection and a urinalysis if the burning does not resolve.  May need to check to see if there is granulomatous tissue in the repair... RTC in one year [facility] for med[facility] for bipolar and seizure disorder.  Also talk with them about your hypertension and the meds you need to continue.  They will need to prescribe medication for hypertension.  I discussed the assessment and treatment plan with the patient. The patient was provided an opportunity to ask questions and all were answered. The patient agreed with the plan and demonstrated an understanding of the instructions.   The patient was advised to call back or seek an in-person evaluation/go to the ED for any concerning postpartum symptoms.  I provided 12 minutes of face-to-face time during this encounter.   Currie Pariserri L Vansh Reckart, NP [facility], [facility]

## 2018-08-03 NOTE — Patient Instructions (Signed)
See your doctor at your appointment for management of medications for seizures, bipolar and any postpartum depression.  Also your provider will need to evaluate your blood pressure medications and continue to prescribe the needed medications.

## 2018-08-03 NOTE — Progress Notes (Signed)
8:17a- Called pt for My Chart visit, no answer, left VM that will call back in 10 min.  8;27a-Pt answered

## 2018-08-05 ENCOUNTER — Other Ambulatory Visit: Payer: Self-pay

## 2018-08-05 ENCOUNTER — Encounter: Payer: Self-pay | Admitting: Family Medicine

## 2018-08-05 ENCOUNTER — Ambulatory Visit: Payer: Medicaid Other | Attending: Family Medicine | Admitting: Family Medicine

## 2018-08-05 ENCOUNTER — Telehealth (INDEPENDENT_AMBULATORY_CARE_PROVIDER_SITE_OTHER): Payer: Medicaid Other | Admitting: Clinical

## 2018-08-05 VITALS — BP 93/66 | HR 89 | Temp 98.5°F | Ht 65.5 in | Wt 270.0 lb

## 2018-08-05 DIAGNOSIS — Z825 Family history of asthma and other chronic lower respiratory diseases: Secondary | ICD-10-CM | POA: Diagnosis not present

## 2018-08-05 DIAGNOSIS — Z87891 Personal history of nicotine dependence: Secondary | ICD-10-CM | POA: Insufficient documentation

## 2018-08-05 DIAGNOSIS — F319 Bipolar disorder, unspecified: Secondary | ICD-10-CM | POA: Insufficient documentation

## 2018-08-05 DIAGNOSIS — Z7982 Long term (current) use of aspirin: Secondary | ICD-10-CM | POA: Diagnosis not present

## 2018-08-05 DIAGNOSIS — I10 Essential (primary) hypertension: Secondary | ICD-10-CM | POA: Diagnosis not present

## 2018-08-05 DIAGNOSIS — Z79899 Other long term (current) drug therapy: Secondary | ICD-10-CM

## 2018-08-05 DIAGNOSIS — J4520 Mild intermittent asthma, uncomplicated: Secondary | ICD-10-CM | POA: Diagnosis not present

## 2018-08-05 DIAGNOSIS — F3163 Bipolar disorder, current episode mixed, severe, without psychotic features: Secondary | ICD-10-CM | POA: Diagnosis not present

## 2018-08-05 DIAGNOSIS — J309 Allergic rhinitis, unspecified: Secondary | ICD-10-CM

## 2018-08-05 DIAGNOSIS — D649 Anemia, unspecified: Secondary | ICD-10-CM

## 2018-08-05 DIAGNOSIS — Z8249 Family history of ischemic heart disease and other diseases of the circulatory system: Secondary | ICD-10-CM | POA: Insufficient documentation

## 2018-08-05 MED ORDER — ALBUTEROL SULFATE (2.5 MG/3ML) 0.083% IN NEBU: 2.5000 mg | INHALATION_SOLUTION | Freq: Four times a day (QID) | RESPIRATORY_TRACT | 1 refills | Status: DC | PRN

## 2018-08-05 MED ORDER — ALBUTEROL SULFATE HFA 108 (90 BASE) MCG/ACT IN AERS: 2.0000 | INHALATION_SPRAY | Freq: Four times a day (QID) | RESPIRATORY_TRACT | 4 refills | Status: DC | PRN

## 2018-08-05 MED ORDER — LORATADINE 10 MG PO TABS: 10.0000 mg | ORAL_TABLET | Freq: Every day | ORAL | 11 refills | Status: DC

## 2018-08-05 MED ORDER — LABETALOL HCL 200 MG PO TABS: 200.0000 mg | ORAL_TABLET | Freq: Two times a day (BID) | ORAL | 4 refills | Status: DC

## 2018-08-05 MED ORDER — AMLODIPINE BESYLATE 5 MG PO TABS: 5.0000 mg | ORAL_TABLET | Freq: Every day | ORAL | 4 refills | Status: DC

## 2018-08-05 NOTE — Progress Notes (Signed)
New Patient Office Visit  Subjective:  Patient ID: Colleen Stone, female    DOB: 1995-02-23  Age: 23 y.o. MRN: [id number]  CC:  Chief Complaint  Patient presents with   New Patient (Initial Visit)    HPI Colleen Stone presents to establish care.  She reports a history of hypertension, asthma, allergic rhinitis and is currently breast-feeding status post recent childbirth on 07/05/2018. (Patient also with history of bipolar disorder per medical records).  She reports that her mother and the father of her child are both helping her with the care of her newborn.  She states that overall she feels well.  She denies headaches or dizziness related to her blood pressure.  She has had no current issues with her asthma.  No issues with coughing, shortness of breath or wheezing.  She believes that she may have been hospitalized x1 when she was a young child due to her asthma but has had no emergency department or hospitalizations since that time related to her asthma.  She has had some mild nasal congestion which she feels is due to her allergic rhinitis..  She also has some mild fatigue but has been getting sleep as her mother has been watching the baby at night so that the patient can get additional sleep.  She reports that she was not aware of being anemic after her recent childbirth.  She denies any issues with unusual bruising or bleeding.  Past Medical History:  Diagnosis Date   Anxiety    Asthma    Chlamydia    Gonorrhea    Hypertension    takes labetalol   MRSA infection    8th grade   Other acne 06/30/2008   Overview:  Acne   PCOS (polycystic ovarian syndrome)    Seizures (HCC)    Last seizure in 2017   Trichomonas infection     Past Surgical History:  Procedure Laterality Date   ADENOIDECTOMY     NASAL SEPTOPLASTY W/ TURBINOPLASTY     ROOT CANAL     TONSILLECTOMY     WISDOM TOOTH EXTRACTION      Family History  Problem Relation Age of Onset   Asthma  Mother    Heart disease Mother        murmur   Hypertension Mother    Heart disease Father     Social History   Socioeconomic History   Marital status: Single    Spouse name: Not on file   Number of children: Not on file   Years of education: Not on file   Highest education level: Not on file  Occupational History   Not on file  Social Needs   Financial resource strain: Not hard at all   Food insecurity    Worry: Never true    Inability: Never true   Transportation needs    Medical: No    Non-medical: Not on file  Tobacco Use   Smoking status: Former Smoker    Types: Cigarettes    Quit date: 10/03/2017    Years since quitting: 0.8   Smokeless tobacco: Never Used  Substance and Sexual Activity   Alcohol use: Not Currently    Comment: Social   Drug use: Not Currently    Types: Marijuana    Comment: Last marijuana 11/19   Sexual activity: Not Currently    Birth control/protection: Implant  Lifestyle   Physical activity    Days per week: Not on file    Minutes per session:  Not on file   Stress: Not at all  Relationships   Social connections    Talks on phone: Not on file    Gets together: Not on file    Attends religious service: Not on file    Active member of club or organization: Not on file    Attends meetings of clubs or organizations: Not on file    Relationship status: Not on file   Intimate partner violence    Fear of current or ex partner: No    Emotionally abused: No    Physically abused: No    Forced sexual activity: No  Other Topics Concern   Not on file  Social History Narrative   Not on file    ROS Review of Systems  Constitutional: Positive for fatigue (mild). Negative for chills and fever.  HENT: Positive for congestion and rhinorrhea. Negative for sinus pressure, sore throat and trouble swallowing.   Eyes: Negative for photophobia and visual disturbance.  Respiratory: Negative for cough and shortness of breath.     Cardiovascular: Negative for chest pain, palpitations and leg swelling.  Gastrointestinal: Negative for abdominal pain, blood in stool, constipation, diarrhea and nausea.  Endocrine: Negative for cold intolerance, heat intolerance, polydipsia, polyphagia and polyuria.  Genitourinary: Negative for dysuria and frequency.  Musculoskeletal: Negative for arthralgias, back pain and myalgias.  Neurological: Negative for dizziness and headaches.  Hematological: Negative for adenopathy. Does not bruise/bleed easily.  Psychiatric/Behavioral: Negative for self-injury and suicidal ideas.    Objective:   Today's Vitals: BP 93/66 (BP Location: Right Arm, Patient Position: Sitting, Cuff Size: Large)    Pulse 89    Temp 98.5 F (36.9 C) (Oral)    Ht 5' 5.5" (1.664 m)    Wt 270 lb (122.5 kg)    SpO2 100%    Breastfeeding Yes    BMI 44.25 kg/m   Physical Exam Constitutional:      General: She is not in acute distress.    Appearance: Normal appearance. She is obese. She is not ill-appearing.  HENT:     Nose: Mucosal edema, congestion and rhinorrhea (Mild clear discharge) present.     Right Turbinates: Enlarged, swollen and pale.     Left Turbinates: Enlarged, swollen and pale.     Right Sinus: No maxillary sinus tenderness or frontal sinus tenderness.     Left Sinus: No maxillary sinus tenderness or frontal sinus tenderness.     Mouth/Throat:     Mouth: Mucous membranes are moist.     Pharynx: No oropharyngeal exudate.  Neck:     Musculoskeletal: Neck supple. No neck rigidity or muscular tenderness.     Vascular: No carotid bruit.  Cardiovascular:     Rate and Rhythm: Normal rate and regular rhythm.  Pulmonary:     Effort: Pulmonary effort is normal.     Breath sounds: Normal breath sounds. No wheezing.  Abdominal:     Palpations: Abdomen is soft.     Tenderness: There is no abdominal tenderness. There is no right CVA tenderness, left CVA tenderness, guarding or rebound.  Musculoskeletal:      Right lower leg: No edema.     Left lower leg: No edema.  Lymphadenopathy:     Cervical: No cervical adenopathy.  Skin:    General: Skin is warm and dry.  Neurological:     General: No focal deficit present.     Mental Status: She is alert and oriented to person, place, and time.  Psychiatric:  Mood and Affect: Mood normal.        Behavior: Behavior normal.        Thought Content: Thought content normal.     Assessment & Plan:  1. Essential hypertension Patient's blood pressure at today's visit is on the lower end of normal.  She denies any symptoms such as dizziness or lightheadedness.  Patient is currently breast-feeding and is reminded to remain well-hydrated.  Patient's blood pressure medication may potentially be decreased at some point as she has likely had improvement in blood pressure after giving birth.  She is reminded to continue low-sodium diet as well as monitoring of the blood pressure.  Regular cardiovascular exercise as well as weight loss advised. - Comprehensive metabolic panel - amLODipine (NORVASC) 5 MG tablet; Take 1 tablet (5 mg total) by mouth daily.  Dispense: 30 tablet; Refill: 4 - labetalol (NORMODYNE) 200 MG tablet; Take 1 tablet (200 mg total) by mouth 2 (two) times daily.  Dispense: 60 tablet; Refill: 4  2. Anemia, unspecified type Review of labs/chart prior to patient's appointment, patient with hemoglobin of 10.6 on 07/05/2018 with MCV of 77.7.  Patient with anemia that is likely due to iron deficiency.  Patient will have repeat CBC at today's visit and will be advised if an iron supplement is needed.  Patient is currentl still taking her prenatal multivitamins. - CBC with Differential  3. Encounter for long-term (current) use of medications Patient will have CMP done in follow-up of long-term use of medications for treatment of hypertension. - Comprehensive metabolic panel  4. Mild intermittent asthma without complication Patient requests refill of  both albuterol inhaler as well as albuterol nebulizer solution to use as needed for treatment of mild intermittent asthma - albuterol (VENTOLIN HFA) 108 (90 Base) MCG/ACT inhaler; Inhale 2 puffs into the lungs every 6 (six) hours as needed for wheezing or shortness of breath.  Dispense: 18 g; Refill: 4 - albuterol (PROVENTIL) (2.5 MG/3ML) 0.083% nebulizer solution; Take 3 mLs (2.5 mg total) by nebulization every 6 (six) hours as needed for wheezing or shortness of breath.  Dispense: 150 mL; Refill: 1  5. Allergic rhinitis, unspecified seasonality, unspecified trigger Prescription for loratadine 10 mg to take as needed on a daily basis for treatment of allergic rhinitis symptoms including congestion.  Medication is category B for breast-feeding. - loratadine (CLARITIN) 10 MG tablet; Take 1 tablet (10 mg total) by mouth daily. For nasal congestion  Dispense: 30 tablet; Refill: 11  Outpatient Encounter Medications as of 08/05/2018  Medication Sig   amLODipine (NORVASC) 5 MG tablet Take 1 tablet (5 mg total) by mouth daily.   ibuprofen (ADVIL) 600 MG tablet Take 1 tablet (600 mg total) by mouth every 6 (six) hours as needed.   labetalol (NORMODYNE) 200 MG tablet Take 1 tablet (200 mg total) by mouth 2 (two) times daily.   Prenatal Vit-Fe Fumarate-FA (PRENATAL MULTIVITAMIN) TABS tablet Take 1 tablet by mouth daily at 12 noon.   aspirin EC 81 MG tablet Take 81 mg by mouth daily.   Prenat-Fe Poly-Methfol-FA-DHA (VITAFOL ULTRA) 29-0.6-0.4-200 MG CAPS Take 1 capsule by mouth daily. (Patient not taking: Reported on 08/05/2018)   No facility-administered encounter medications on file as of 08/05/2018.    An After Visit Summary was printed and given to the patient.  Follow-up: Return in about 3 months (around 11/05/2018) for HTN/asthma/anemia.  Cain Saupe, MD

## 2018-08-05 NOTE — Patient Instructions (Signed)
Asthma, Adult ° °Asthma is a long-term (chronic) condition in which the airways get tight and narrow. The airways are the breathing passages that lead from the nose and mouth down into the lungs. A person with asthma will have times when symptoms get worse. These are called asthma attacks. They can cause coughing, whistling sounds when you breathe (wheezing), shortness of breath, and chest pain. They can make it hard to breathe. There is no cure for asthma, but medicines and lifestyle changes can help control it. °There are many things that can bring on an asthma attack or make asthma symptoms worse (triggers). Common triggers include: °· Mold. °· Dust. °· Cigarette smoke. °· Cockroaches. °· Things that can cause allergy symptoms (allergens). These include animal skin flakes (dander) and pollen from trees or grass. °· Things that pollute the air. These may include household cleaners, wood smoke, smog, or chemical odors. °· Cold air, weather changes, and wind. °· Crying or laughing hard. °· Stress. °· Certain medicines or drugs. °· Certain foods such as dried fruit, potato chips, and grape juice. °· Infections, such as a cold or the flu. °· Certain medical conditions or diseases. °· Exercise or tiring activities. °Asthma may be treated with medicines and by staying away from the things that cause asthma attacks. Types of medicines may include: °· Controller medicines. These help prevent asthma symptoms. They are usually taken every day. °· Fast-acting reliever or rescue medicines. These quickly relieve asthma symptoms. They are used as needed and provide short-term relief. °· Allergy medicines if your attacks are brought on by allergens. °· Medicines to help control the body's defense (immune) system. °Follow these instructions at home: °Avoiding triggers in your home °· Change your heating and air conditioning filter often. °· Limit your use of fireplaces and wood stoves. °· Get rid of pests (such as roaches and  mice) and their droppings. °· Throw away plants if you see mold on them. °· Clean your floors. Dust regularly. Use cleaning products that do not smell. °· Have someone vacuum when you are not home. Use a vacuum cleaner with a HEPA filter if possible. °· Replace carpet with wood, tile, or vinyl flooring. Carpet can trap animal skin flakes and dust. °· Use allergy-proof pillows, mattress covers, and box spring covers. °· Wash bed sheets and blankets every week in hot water. Dry them in a dryer. °· Keep your bedroom free of any triggers. °· Avoid pets and keep windows closed when things that cause allergy symptoms are in the air. °· Use blankets that are made of polyester or cotton. °· Clean bathrooms and kitchens with bleach. If possible, have someone repaint the walls in these rooms with mold-resistant paint. Keep out of the rooms that are being cleaned and painted. °· Wash your hands often with soap and water. If soap and water are not available, use hand sanitizer. °· Do not allow anyone to smoke in your home. °General instructions °· Take over-the-counter and prescription medicines only as told by your doctor. °? Talk with your doctor if you have questions about how or when to take your medicines. °? Make note if you need to use your medicines more often than usual. °· Do not use any products that contain nicotine or tobacco, such as cigarettes and e-cigarettes. If you need help quitting, ask your doctor. °· Stay away from secondhand smoke. °· Avoid doing things outdoors when allergen counts are high and when air quality is low. °· Wear a ski mask   when doing outdoor activities in the winter. The mask should cover your nose and mouth. Exercise indoors on cold days if you can. °· Warm up before you exercise. Take time to cool down after exercise. °· Use a peak flow meter as told by your doctor. A peak flow meter is a tool that measures how well the lungs are working. °· Keep track of the peak flow meter's readings.  Write them down. °· Follow your asthma action plan. This is a written plan for taking care of your asthma and treating your attacks. °· Make sure you get all the shots (vaccines) that your doctor recommends. Ask your doctor about a flu shot and a pneumonia shot. °· Keep all follow-up visits as told by your doctor. This is important. °Contact a doctor if: °· You have wheezing, shortness of breath, or a cough even while taking medicine to prevent attacks. °· The mucus you cough up (sputum) is thicker than usual. °· The mucus you cough up changes from clear or white to yellow, green, gray, or bloody. °· You have problems from the medicine you are taking, such as: °? A rash. °? Itching. °? Swelling. °? Trouble breathing. °· You need reliever medicines more than 2-3 times a week. °· Your peak flow reading is still at 50-79% of your personal best after following the action plan for 1 hour. °· You have a fever. °Get help right away if: °· You seem to be worse and are not responding to medicine during an asthma attack. °· You are short of breath even at rest. °· You get short of breath when doing very little activity. °· You have trouble eating, drinking, or talking. °· You have chest pain or tightness. °· You have a fast heartbeat. °· Your lips or fingernails start to turn blue. °· You are light-headed or dizzy, or you faint. °· Your peak flow is less than 50% of your personal best. °· You feel too tired to breathe normally. °Summary °· Asthma is a long-term (chronic) condition in which the airways get tight and narrow. An asthma attack can make it hard to breathe. °· Asthma cannot be cured, but medicines and lifestyle changes can help control it. °· Make sure you understand how to avoid triggers and how and when to use your medicines. °This information is not intended to replace advice given to you by your health care provider. Make sure you discuss any questions you have with your health care provider. °Document  Released: 06/19/2007 Document Revised: 03/05/2018 Document Reviewed: 02/05/2016 °Elsevier Patient Education © 2020 [employer] ° °

## 2018-08-05 NOTE — BH Specialist Note (Addendum)
[facility] Visit via Telemedicine (Telephone)  08/05/2018 [facility] [id number]   Session Start time: 2:15 Session End time: 2:42 Total time: 30 minutes  Referring Provider: Earlie Server, NP Type of Visit: Telephonic Patient location: Home [facility] Provider location: [facility] All persons participating in visit: Patient Colleen Stone and [facility] Stone  Confirmed patient's address: Yes  Confirmed patient's phone number: Yes  Any changes to demographics: No   Confirmed patient's insurance: Yes  Any changes to patient's insurance: No   Discussed confidentiality: At previous visit   The following statements were read to the patient and/or legal guardian that are established with the [facility] Provider.  "The purpose of this phone visit is to provide behavioral health care while limiting exposure to the coronavirus (COVID19).  There is a possibility of technology failure and discussed alternative modes of communication if that failure occurs."  "By engaging in this telephone visit, you consent to the provision of healthcare.  Additionally, you authorize for your insurance to be billed for the services provided during this telephone visit."   Patient and/or legal guardian consented to telephone visit: Yes   PRESENTING CONCERNS: Patient and/or family reports the following symptoms/concerns: Pt states she did hear from [facility], but is waiting to have an appointment rescheduled. Pt did implement small changes to improve her quality of sleep; since eating every morning, she has eliminated daily headaches. Pt's mother is coming over nightly to help with baby,  FOB has been more supportive in the past week, and pt plans to attend her first visit with a new PCP today.  Duration of problem: ongoing; Severity of problem: moderate  STRENGTHS (Protective Factors/Coping Skills): Self-awareness; open to treatment  GOALS  ADDRESSED: Patient will: 1.  Reduce symptoms of: anxiety, depression and mood instability  2.  Increase knowledge and/or ability of: healthy habits  3.  Demonstrate ability to: Increase healthy adjustment to current life circumstances, Increase adequate support systems for patient/family and Increase motivation to adhere to plan of care  INTERVENTIONS: Interventions utilized:  Psychoeducation and/or Health Education and Link to [employer] Standardized Assessments completed: Not Needed  ASSESSMENT: Patient currently experiencing Bipolar affective disorder, recurrent, mixed, without psychotic features.   Patient may benefit from continued brief therapeutic interventions regarding reduction of symptoms of anxiety and depression related to bipolar disorder.  PLAN: 1. Follow up with behavioral health clinician on : As needed. Pt may follow up with [facility] at [facility] at upcoming PCP visit(s) 2. Behavioral recommendations:  -Continue with plan to attend PCP appointment today -Continue with plan to establish care with psychiatry at [facility] -Continue prioritizing healthy sleep nightly, including using sleep sounds after baby's bath -Continue eating breakfast daily -Consider attending at least one new mom online support group, as previously discussed  3. Referral(s): [facility] (In Clinic) and [facility] Resources:  New mom support group  [facility]

## 2018-08-05 NOTE — Progress Notes (Signed)
Pt. Is here to establish care for primary care doctor and for asthma.

## 2018-08-06 LAB — CBC WITH DIFFERENTIAL/PLATELET
Basophils Absolute: 0 x10E3/uL (ref 0.0–0.2)
Basos: 1 %
EOS (ABSOLUTE): 0.2 x10E3/uL (ref 0.0–0.4)
Eos: 3 %
Hematocrit: 38.2 % (ref 34.0–46.6)
Hemoglobin: 11.7 g/dL (ref 11.1–15.9)
Immature Grans (Abs): 0 x10E3/uL (ref 0.0–0.1)
Immature Granulocytes: 0 %
Lymphocytes Absolute: 3 x10E3/uL (ref 0.7–3.1)
Lymphs: 37 %
MCH: 24.8 pg — ABNORMAL LOW (ref 26.6–33.0)
MCHC: 30.6 g/dL — ABNORMAL LOW (ref 31.5–35.7)
MCV: 81 fL (ref 79–97)
Monocytes Absolute: 0.4 x10E3/uL (ref 0.1–0.9)
Monocytes: 5 %
Neutrophils Absolute: 4.4 x10E3/uL (ref 1.4–7.0)
Neutrophils: 54 %
Platelets: 445 x10E3/uL (ref 150–450)
RBC: 4.72 x10E6/uL (ref 3.77–5.28)
RDW: 16.4 % — ABNORMAL HIGH (ref 11.7–15.4)
WBC: 8 x10E3/uL (ref 3.4–10.8)

## 2018-08-06 LAB — COMPREHENSIVE METABOLIC PANEL WITH GFR
ALT: 13 IU/L (ref 0–32)
AST: 14 IU/L (ref 0–40)
Albumin/Globulin Ratio: 1.5 (ref 1.2–2.2)
Albumin: 4.1 g/dL (ref 3.9–5.0)
Alkaline Phosphatase: 111 IU/L (ref 39–117)
BUN/Creatinine Ratio: 12 (ref 9–23)
BUN: 9 mg/dL (ref 6–20)
Bilirubin Total: <0.2 mg/dL (ref 0.0–1.2)
CO2: 25 mmol/L (ref 20–29)
Calcium: 10 mg/dL (ref 8.7–10.2)
Chloride: 106 mmol/L (ref 96–106)
Creatinine, Ser: 0.75 mg/dL (ref 0.57–1.00)
GFR calc Af Amer: 129 mL/min/1.73
GFR calc non Af Amer: 112 mL/min/1.73
Globulin, Total: 2.8 g/dL (ref 1.5–4.5)
Glucose: 74 mg/dL (ref 65–99)
Potassium: 4.9 mmol/L (ref 3.5–5.2)
Sodium: 142 mmol/L (ref 134–144)
Total Protein: 6.9 g/dL (ref 6.0–8.5)

## 2018-08-07 ENCOUNTER — Telehealth: Payer: Self-pay | Admitting: *Deleted

## 2018-08-07 MED ORDER — FLUCONAZOLE 150 MG PO TABS: 150.0000 mg | ORAL_TABLET | ORAL | 0 refills | Status: DC

## 2018-08-07 NOTE — Telephone Encounter (Signed)
Pt left message stating that her baby has been diagnosed with thrush. She is requesting Rx for Nystatin or does she need appt to be seen. Per Dr. Harolyn Rutherford - Diflucan prescribed. I called pt back and left message stating that a prescription will be called to her pharmacy. It is not what she asked for however is preferred by the doctor. She may call us back if she has additional questions.

## 2018-11-04 ENCOUNTER — Ambulatory Visit: Payer: Medicaid Other | Admitting: Family Medicine

## 2018-11-27 ENCOUNTER — Telehealth: Payer: Self-pay | Admitting: Family Medicine

## 2018-11-27 ENCOUNTER — Ambulatory Visit: Payer: Medicaid Other | Admitting: Family Medicine

## 2018-11-27 NOTE — Telephone Encounter (Signed)
patient scored a EDPDS scored a 15 please fu with patient

## 2018-12-02 NOTE — Telephone Encounter (Signed)
LCSW placed call to patient to follow up on EDPDS. LCSW introduced self and explained role at [facility]. Pt reported that she has been provided resources via mail; however, she has yet to receive them. Pt is open to initiating therapy and medication management and is agreeable to LCSW completing referral to [facility].   LCSW encouraged pt to contact clinic with any additional resource or behavioral health concerns. Pt was appreciative for the assistance. No additional concerns noted.  LCSW faxed completed referral to [facility].

## 2018-12-25 ENCOUNTER — Telehealth: Payer: Self-pay | Admitting: Licensed Clinical Social Worker

## 2018-12-25 NOTE — Telephone Encounter (Signed)
Follow up call placed to inquire about referral to psychiatry. LCSW left message for a return call.

## 2019-02-04 ENCOUNTER — Ambulatory Visit: Payer: Medicaid Other | Attending: Family Medicine | Admitting: Physician Assistant

## 2019-02-04 ENCOUNTER — Other Ambulatory Visit (HOSPITAL_COMMUNITY)
Admission: RE | Admit: 2019-02-04 | Discharge: 2019-02-04 | Disposition: A | Discharge status: Home / Self Care | Payer: Medicaid Other | Source: Ambulatory Visit | Attending: Family Medicine | Admitting: Family Medicine

## 2019-02-04 ENCOUNTER — Other Ambulatory Visit: Payer: Self-pay

## 2019-02-04 VITALS — BP 138/93 | HR 99 | Ht 65.0 in | Wt 290.0 lb

## 2019-02-04 DIAGNOSIS — I10 Essential (primary) hypertension: Secondary | ICD-10-CM

## 2019-02-04 DIAGNOSIS — Z113 Encounter for screening for infections with a predominantly sexual mode of transmission: Secondary | ICD-10-CM | POA: Insufficient documentation

## 2019-02-04 DIAGNOSIS — N760 Acute vaginitis: Secondary | ICD-10-CM | POA: Diagnosis not present

## 2019-02-04 DIAGNOSIS — N898 Other specified noninflammatory disorders of vagina: Secondary | ICD-10-CM

## 2019-02-04 DIAGNOSIS — Z91041 Radiographic dye allergy status: Secondary | ICD-10-CM | POA: Insufficient documentation

## 2019-02-04 DIAGNOSIS — Z91040 Latex allergy status: Secondary | ICD-10-CM | POA: Insufficient documentation

## 2019-02-04 DIAGNOSIS — J45909 Unspecified asthma, uncomplicated: Secondary | ICD-10-CM | POA: Diagnosis not present

## 2019-02-04 DIAGNOSIS — Z7982 Long term (current) use of aspirin: Secondary | ICD-10-CM | POA: Diagnosis not present

## 2019-02-04 DIAGNOSIS — Z79899 Other long term (current) drug therapy: Secondary | ICD-10-CM | POA: Diagnosis not present

## 2019-02-04 MED ORDER — AMLODIPINE BESYLATE 5 MG PO TABS: 5.0000 mg | ORAL_TABLET | Freq: Every day | ORAL | 4 refills | Status: DC

## 2019-02-04 NOTE — Progress Notes (Signed)
Patient ID: Colleen Stone, female   DOB: January 12, 1996, 24 y.o.   MRN: [id number]   Colleen Stone, is a 24 y.o. female  TKZ:[id number]  TDD:[id number]  DOB - [birth year]  Subjective:  Chief Complaint and HPI: Colleen Stone is a 24 y.o. female here today for STD check with vaginal discharge.  About 2 weeks ago she was having some vaginal d/c that was whitish.  Has 31 month old baby.  No new pelvic pain.  No discolored discharge.  No foul odor.  No urinary s/sx.  No fever.  Does not want to do HIV/RPR.  H/o trich.  Monogamous with partner.  nexplanon for [facility].    Denies HA/CP/SOB.    ROS:   Constitutional:  No f/c, No night sweats, No unexplained weight loss. EENT:  No vision changes, No blurry vision, No hearing changes. No mouth, throat, or ear problems.  Respiratory: No cough, No SOB Cardiac: No CP, no palpitations GI:  No abd pain, No N/V/D. GU: No Urinary s/sx Musculoskeletal: No joint pain Neuro: No headache, no dizziness, no motor weakness.  Skin: No rash Endocrine:  No polydipsia. No polyuria.  Psych: Denies SI/HI  No problems updated.  ALLERGIES: Allergies  Allergen Reactions  . Latex Hives and Swelling  . Red Dye Nausea And Vomiting and Other (See Comments)    Her body rejects it    PAST MEDICAL HISTORY: Past Medical History:  Diagnosis Date  . Anxiety   . Asthma   . Chlamydia   . Gonorrhea   . Hypertension    takes labetalol  . MRSA infection    8th grade  . Other acne 06/30/2008   Overview:  Acne  . PCOS (polycystic ovarian syndrome)   . Seizures ([place])    Last seizure in 2017  . Trichomonas infection     MEDICATIONS AT HOME: Prior to Admission medications   Medication Sig Start Date End Date Taking? Authorizing Provider  albuterol (PROVENTIL) (2.5 MG/3ML) 0.083% nebulizer solution Take 3 mLs (2.5 mg total) by nebulization every 6 (six) hours as needed for wheezing or shortness of breath. 08/05/18  Yes Fulp, Cammie, MD  albuterol (VENTOLIN HFA)  108 (90 Base) MCG/ACT inhaler Inhale 2 puffs into the lungs every 6 (six) hours as needed for wheezing or shortness of breath. 08/05/18  Yes Fulp, Cammie, MD  amLODipine (NORVASC) 5 MG tablet Take 1 tablet (5 mg total) by mouth daily. 02/04/19  Yes Argentina Donovan, PA-C  aspirin EC 81 MG tablet Take 81 mg by mouth daily.   Yes [provider]  labetalol (NORMODYNE) 200 MG tablet Take 1 tablet (200 mg total) by mouth 2 (two) times daily. 08/05/18  Yes Fulp, Cammie, MD  loratadine (CLARITIN) 10 MG tablet Take 1 tablet (10 mg total) by mouth daily. For nasal congestion 08/05/18  Yes Fulp, Cammie, MD  Prenatal Vit-Fe Fumarate-FA (PRENATAL MULTIVITAMIN) TABS tablet Take 1 tablet by mouth daily at 12 noon.   Yes [provider]  fluconazole (DIFLUCAN) 150 MG tablet Take 1 tablet (150 mg total) by mouth every 3 (three) days. Patient not taking: Reported on 02/04/2019 08/07/18   Osborne Oman, MD  ibuprofen (ADVIL) 600 MG tablet Take 1 tablet (600 mg total) by mouth every 6 (six) hours as needed. Patient not taking: Reported on 02/04/2019 07/07/18   Nicolette Bang, DO     Objective:  EXAM:   Vitals:   02/04/19 1019  BP: (!) 138/93  Pulse: 99  SpO2: 96%  Weight: 290 lb (131.5 kg)  Height: 5\' 5"  (1.651 m)    General appearance : A&OX3. NAD. Non-toxic-appearing HEENT: Atraumatic and Normocephalic.  PERRLA. EOM intact.   Chest/Lungs:  Breathing-non-labored, Good air entry bilaterally, breath sounds normal without rales, rhonchi, or wheezing  CVS: S1 S2 regular, no murmurs, gallops, rubs  GU:  External genitalia with some erythema.  TTP at introitus with speculum insertion.  Superior aspect of cervix visualized.  Swabs taken.  Some whitish d/c in vault.  Bimanual unremarkable Extremities: Bilateral Lower Ext shows no edema, both legs are warm to touch with = pulse throughout Neurology:  CN II-XII grossly intact, Non focal.   Psych:  TP linear. J/I WNL. Normal speech.  Appropriate eye contact and affect.  Skin:  No Rash  Data Review Lab Results  Component Value Date   HGBA1C 4.8 02/11/2018     Assessment & Plan   1. Vaginal discharge Will wait to treat if needed.   - Cervicovaginal ancillary only  2. Screening examination for STD (sexually transmitted disease) - Cervicovaginal ancillary only  3. Essential hypertension Not at goal.  Check BP 3-5 times/week and record and bring to next visit.   - amLODipine (NORVASC) 5 MG tablet; Take 1 tablet (5 mg total) by mouth daily.  Dispense: 30 tablet; Refill: 4   Patient have been counseled extensively about nutrition and exercise  Return in about 2 months (around 04/04/2019) for see PCP;  Bp and bloodwork.  The patient was given clear instructions to go to ER or return to medical center if symptoms don't improve, worsen or new problems develop. The patient verbalized understanding. The patient was told to call to get lab results if they haven't heard anything in the next week.     04/06/2019, PA-C [facility] and [facility], [place]   02/04/2019, 10:41 AM

## 2019-02-04 NOTE — Patient Instructions (Signed)
Check blood pressure out of the office 3 to 5 times weekly and record and bring to next appt

## 2019-02-09 LAB — CERVICOVAGINAL ANCILLARY ONLY
Bacterial Vaginitis (gardnerella): POSITIVE — AB
Candida Glabrata: NEGATIVE
Candida Vaginitis: NEGATIVE
Chlamydia: NEGATIVE
Comment: NEGATIVE
Comment: NEGATIVE
Comment: NEGATIVE
Comment: NEGATIVE
Comment: NEGATIVE
Comment: NORMAL
Neisseria Gonorrhea: NEGATIVE
Trichomonas: NEGATIVE

## 2019-02-16 ENCOUNTER — Other Ambulatory Visit: Payer: Self-pay | Admitting: Physician Assistant

## 2019-02-16 MED ORDER — METRONIDAZOLE 500 MG PO TABS: 500.0000 mg | ORAL_TABLET | Freq: Two times a day (BID) | ORAL | 0 refills | Status: DC

## 2019-06-09 ENCOUNTER — Telehealth: Payer: Self-pay

## 2019-06-09 NOTE — Telephone Encounter (Signed)
Received call thru Nurse line 06/08/19, patient wanting to make appointment with [facility]. Will forward her request.

## 2019-06-21 ENCOUNTER — Other Ambulatory Visit: Payer: Self-pay

## 2019-06-21 ENCOUNTER — Encounter (HOSPITAL_COMMUNITY): Payer: Self-pay | Admitting: Emergency Medicine

## 2019-06-21 ENCOUNTER — Emergency Department (HOSPITAL_COMMUNITY)
Admission: EM | Admit: 2019-06-21 | Discharge: 2019-06-21 | Disposition: A | Discharge status: Home / Self Care | Payer: BC Managed Care – PPO | Attending: Emergency Medicine | Admitting: Emergency Medicine

## 2019-06-21 DIAGNOSIS — T7840XA Allergy, unspecified, initial encounter: Secondary | ICD-10-CM

## 2019-06-21 DIAGNOSIS — Z8669 Personal history of other diseases of the nervous system and sense organs: Secondary | ICD-10-CM | POA: Insufficient documentation

## 2019-06-21 DIAGNOSIS — L500 Allergic urticaria: Secondary | ICD-10-CM | POA: Diagnosis not present

## 2019-06-21 DIAGNOSIS — I10 Essential (primary) hypertension: Secondary | ICD-10-CM | POA: Insufficient documentation

## 2019-06-21 DIAGNOSIS — Z7982 Long term (current) use of aspirin: Secondary | ICD-10-CM | POA: Diagnosis not present

## 2019-06-21 DIAGNOSIS — F319 Bipolar disorder, unspecified: Secondary | ICD-10-CM | POA: Insufficient documentation

## 2019-06-21 DIAGNOSIS — Z91040 Latex allergy status: Secondary | ICD-10-CM | POA: Diagnosis not present

## 2019-06-21 DIAGNOSIS — L539 Erythematous condition, unspecified: Secondary | ICD-10-CM | POA: Diagnosis not present

## 2019-06-21 DIAGNOSIS — Z79899 Other long term (current) drug therapy: Secondary | ICD-10-CM | POA: Diagnosis not present

## 2019-06-21 DIAGNOSIS — L509 Urticaria, unspecified: Secondary | ICD-10-CM

## 2019-06-21 DIAGNOSIS — Z87891 Personal history of nicotine dependence: Secondary | ICD-10-CM | POA: Diagnosis not present

## 2019-06-21 MED ORDER — FAMOTIDINE 20 MG PO TABS: 20.0000 mg | ORAL_TABLET | Freq: Two times a day (BID) | ORAL | 0 refills | Status: DC

## 2019-06-21 MED ORDER — DIPHENHYDRAMINE HCL 50 MG/ML IJ SOLN
50.0000 mg | Freq: Once | INTRAMUSCULAR | Status: AC
  Administered 2019-06-21: 50 mg via INTRAVENOUS
  Filled 2019-06-21: qty 1

## 2019-06-21 MED ORDER — PREDNISONE 20 MG PO TABS: 40.0000 mg | ORAL_TABLET | Freq: Every day | ORAL | 0 refills | Status: DC

## 2019-06-21 MED ORDER — METHYLPREDNISOLONE SODIUM SUCC 125 MG IJ SOLR
125.0000 mg | Freq: Once | INTRAMUSCULAR | Status: AC
  Administered 2019-06-21: 125 mg via INTRAVENOUS
  Filled 2019-06-21: qty 2

## 2019-06-21 MED ORDER — CETIRIZINE HCL 10 MG PO TABS: 10.0000 mg | ORAL_TABLET | Freq: Two times a day (BID) | ORAL | 1 refills | Status: DC

## 2019-06-21 MED ORDER — FAMOTIDINE IN NACL 20-0.9 MG/50ML-% IV SOLN
20.0000 mg | Freq: Once | INTRAVENOUS | Status: AC
  Administered 2019-06-21: 20 mg via INTRAVENOUS
  Filled 2019-06-21: qty 50

## 2019-06-21 MED ORDER — HYDROCORTISONE 2.5 % EX LOTN: TOPICAL_LOTION | Freq: Two times a day (BID) | CUTANEOUS | 0 refills | Status: DC

## 2019-06-21 NOTE — ED Triage Notes (Signed)
Pt reports left underarm discomfort/pain X1 year starting after she had her birthcontrol placed.  Arm is red and swollen.

## 2019-06-21 NOTE — ED Notes (Signed)
Patient verbalizes understanding of discharge instructions. Opportunity for questioning and answers were provided. Armband removed by staff, pt discharged from ED. Pt. ambulatory and discharged home.  

## 2019-06-21 NOTE — ED Provider Notes (Signed)
[facility] EMERGENCY DEPARTMENT Provider Note   [name]: [id number] Arrival date & time: 06/21/19  0030     History Chief Complaint  Patient presents with  . Arm Pain    Colleen Stone is a 24 y.o. female with a hx of asthma, anxiety, hypertension, seizure disorder presents to the Emergency Department complaining of gradual, persistent, progressively worsening rash and itching onset around 10 PM.  Patient reports she first noticed the rash under her left arm.  She reports her axilla and around the site where her Nexplanon was placed.  Since that time, patient has developed a rash along bilateral arms, legs, chest, abdomen and back.  She denies difficulty breathing, facial swelling, swelling of throat, voice change, difficulty swallowing, abdominal pain or vomiting.  She reports that after the Nexplanon was placed she had ongoing discomfort at the site, especially when her arm rubbed against things.  She reports this was placed approximately 1 year ago.  She states no known allergies, no new foods or medications, no new environmental exposures, lotions, make-up, hair products etc.  Nothing makes his symptoms better or worse.  No treatments prior to arrival.   The history is provided by the patient and medical records. No language interpreter was used.       Past Medical History:  Diagnosis Date  . Anxiety   . Asthma   . Chlamydia   . Gonorrhea   . Hypertension    takes labetalol  . MRSA infection    8th grade  . Other acne 06/30/2008   Overview:  Acne  . PCOS (polycystic ovarian syndrome)   . Seizures ([facility])    Last seizure in 2017  . Trichomonas infection     Patient Active Problem List   Diagnosis Date Noted  . Hypertension   . History of seizure disorder 06/15/2018  . Bipolar 1 disorder ([facility]) 01/13/2018  . Family history of schizophrenia 01/13/2018  . Smoker 01/13/2018  . Family history of breast cancer 08/31/2012    Past Surgical History:   Procedure Laterality Date  . ADENOIDECTOMY    . NASAL SEPTOPLASTY W/ TURBINOPLASTY    . ROOT CANAL    . TONSILLECTOMY    . WISDOM TOOTH EXTRACTION       OB History    Gravida  2   Para  1   Term  1   Preterm      AB  1   Living  1     SAB  1   TAB      Ectopic      Multiple  0   Live Births  1           Family History  Problem Relation Age of Onset  . Asthma Mother   . Heart disease Mother        murmur  . Hypertension Mother   . Heart disease Father     Social History   Tobacco Use  . Smoking status: Former Smoker    Types: Cigarettes    Quit date: 10/03/2017    Years since quitting: 1.7  . Smokeless tobacco: Never Used  Substance Use Topics  . Alcohol use: Not Currently    Comment: Social  . Drug use: Not Currently    Types: Marijuana    Comment: Last marijuana 11/19    Home Medications Prior to Admission medications   Medication Sig Start Date End Date Taking? Authorizing Provider  albuterol (PROVENTIL) (2.5 MG/3ML) 0.083% nebulizer solution  Take 3 mLs (2.5 mg total) by nebulization every 6 (six) hours as needed for wheezing or shortness of breath. 08/05/18   Fulp, Cammie, MD  albuterol (VENTOLIN HFA) 108 (90 Base) MCG/ACT inhaler Inhale 2 puffs into the lungs every 6 (six) hours as needed for wheezing or shortness of breath. 08/05/18   Fulp, Cammie, MD  amLODipine (NORVASC) 5 MG tablet Take 1 tablet (5 mg total) by mouth daily. 02/04/19   Anders Simmonds, PA-C  aspirin EC 81 MG tablet Take 81 mg by mouth daily.    [provider]  cetirizine (ZYRTEC ALLERGY) 10 MG tablet Take 1 tablet (10 mg total) by mouth 2 (two) times daily. 06/21/19   Elanah Osmanovic, Dahlia Client, PA-C  famotidine (PEPCID) 20 MG tablet Take 1 tablet (20 mg total) by mouth 2 (two) times daily. 06/21/19   Rosaura Bolon, Dahlia Client, PA-C  fluconazole (DIFLUCAN) 150 MG tablet Take 1 tablet (150 mg total) by mouth every 3 (three) days. Patient not taking: Reported on 02/04/2019  08/07/18   Tereso Newcomer, MD  hydrocortisone 2.5 % lotion Apply topically 2 (two) times daily. 06/21/19   Euva Rundell, Dahlia Client, PA-C  ibuprofen (ADVIL) 600 MG tablet Take 1 tablet (600 mg total) by mouth every 6 (six) hours as needed. Patient not taking: Reported on 02/04/2019 07/07/18   Arvilla Market, DO  labetalol (NORMODYNE) 200 MG tablet Take 1 tablet (200 mg total) by mouth 2 (two) times daily. 08/05/18   Fulp, Cammie, MD  loratadine (CLARITIN) 10 MG tablet Take 1 tablet (10 mg total) by mouth daily. For nasal congestion 08/05/18   Fulp, Cammie, MD  metroNIDAZOLE (FLAGYL) 500 MG tablet Take 1 tablet (500 mg total) by mouth 2 (two) times daily. 02/16/19   Anders Simmonds, PA-C  predniSONE (DELTASONE) 20 MG tablet Take 2 tablets (40 mg total) by mouth daily. 06/21/19   Lavaya Defreitas, Dahlia Client, PA-C  Prenatal Vit-Fe Fumarate-FA (PRENATAL MULTIVITAMIN) TABS tablet Take 1 tablet by mouth daily at 12 noon.    [provider]    Allergies    Latex and Red dye  Review of Systems   Review of Systems  Constitutional: Negative for appetite change, diaphoresis, fatigue, fever and unexpected weight change.  HENT: Negative for mouth sores.   Eyes: Negative for visual disturbance.  Respiratory: Negative for cough, chest tightness, shortness of breath and wheezing.   Cardiovascular: Negative for chest pain.  Gastrointestinal: Negative for abdominal pain, constipation, diarrhea, nausea and vomiting.  Endocrine: Negative for polydipsia, polyphagia and polyuria.  Genitourinary: Negative for dysuria, frequency, hematuria and urgency.  Musculoskeletal: Positive for arthralgias. Negative for back pain and neck stiffness.  Skin: Positive for rash.  Allergic/Immunologic: Negative for immunocompromised state.  Neurological: Negative for syncope, light-headedness and headaches.  Hematological: Does not bruise/bleed easily.  Psychiatric/Behavioral: Negative for sleep disturbance. The patient is  not nervous/anxious.     Physical Exam Updated Vital Signs BP 127/81 (BP Location: Right Arm)   Pulse 96   Temp 99.1 F (37.3 C) (Oral)   Resp 16   Ht 5\' 4"  (1.626 m)   Wt 104.3 kg   SpO2 99%   BMI 39.48 kg/m   Physical Exam Vitals and nursing note reviewed.  Constitutional:      General: She is not in acute distress.    Appearance: She is well-developed. She is not diaphoretic.  HENT:     Head: Normocephalic and atraumatic.     Nose: Nose normal. No mucosal edema or rhinorrhea.  Mouth/Throat:     Pharynx: Uvula midline. No oropharyngeal exudate, posterior oropharyngeal erythema or uvula swelling.     Tonsils: No tonsillar abscesses.  Eyes:     Conjunctiva/sclera: Conjunctivae normal.  Neck:     Comments: Patent airway No stridor; normal phonation Handling secretions without difficulty Cardiovascular:     Rate and Rhythm: Normal rate.     Heart sounds: Normal heart sounds. No murmur.  Pulmonary:     Effort: Pulmonary effort is normal. No respiratory distress.     Breath sounds: Normal breath sounds. No stridor. No wheezing.  Abdominal:     General: Bowel sounds are normal. There is no distension.     Palpations: Abdomen is soft.     Tenderness: There is no abdominal tenderness.  Musculoskeletal:        General: Normal range of motion.     Cervical back: Normal range of motion.  Skin:    General: Skin is warm and dry.     Findings: Rash present.     Comments: Urticaria noted Mild excoriations - no induration or fluctuance to indicate secondary infection Left upper inner arm where Nexplanon is placed does have some urticaria but no induration or fluctuance to suggest infection.  Neurological:     Mental Status: She is alert and oriented to person, place, and time.     ED Results / Procedures / Treatments   Labs (all labs ordered are listed, but only abnormal results are displayed) Labs Reviewed - No data to display  EKG None  Radiology No results  found.  Procedures Procedures (including critical care time)  Medications Ordered in ED Medications  diphenhydrAMINE (BENADRYL) injection 50 mg (50 mg Intravenous Given 06/21/19 0507)  methylPREDNISolone sodium succinate (SOLU-MEDROL) 125 mg/2 mL injection 125 mg (125 mg Intravenous Given 06/21/19 0507)  famotidine (PEPCID) IVPB 20 mg premix (0 mg Intravenous Stopped 06/21/19 0547)    ED Course  I have reviewed the triage vital signs and the nursing notes.  Pertinent labs & imaging results that were available during my care of the patient were reviewed by me and considered in my medical decision making (see chart for details).    MDM Rules/Calculators/A&P                       Presents with allergic reaction onset this evening.  No known trigger.  Patient given Benadryl, Solu-Medrol and Pepcid here in the emergency department.  No signs of anaphylaxis.  6:21 AM Patient re-evaluated prior to dc, is hemodynamically stable, in no respiratory distress, and denies the feeling of throat closing.  She has had considerable improvement in her symptoms.  Discussed return to the ED if they have a mod-severe allergic rxn (s/s including throat closing, difficulty breathing, swelling of lips face or tongue). Pt is to follow up with allergy and asthma center. Pt is agreeable with plan & verbalizes understanding.    Final Clinical Impression(s) / ED Diagnoses Final diagnoses:  Urticaria  Allergic reaction, initial encounter    Rx / DC Orders ED Discharge Orders         Ordered    cetirizine (ZYRTEC ALLERGY) 10 MG tablet  2 times daily     06/21/19 0622    famotidine (PEPCID) 20 MG tablet  2 times daily     06/21/19 0622    predniSONE (DELTASONE) 20 MG tablet  Daily     06/21/19 0622    hydrocortisone 2.5 % lotion  2  times daily     06/21/19 0622           Sweden Lesure, Gwenlyn Perking 06/21/19 1117    Ripley Fraise, MD 06/21/19 [phone]

## 2019-06-21 NOTE — Discharge Instructions (Addendum)
1. Medications: Prednisone, Zyrtec, Pepcid, hydrocortisone lotion, usual home medications 2. Treatment: rest, drink plenty of fluids, take medications as prescribed 3. Follow Up: Please followup with your primary doctor in 3 days for discussion of your diagnoses and further evaluation after today's visit; if you do not have a primary care doctor use the resource guide provided to find one; followup with dermatology as needed; Return to the ER for difficulty breathing, return of allergic reaction or other concerning symptoms

## 2019-06-25 ENCOUNTER — Encounter (HOSPITAL_COMMUNITY): Payer: Self-pay

## 2019-06-25 ENCOUNTER — Ambulatory Visit (HOSPITAL_COMMUNITY)
Admission: EM | Admit: 2019-06-25 | Discharge: 2019-06-25 | Disposition: A | Discharge status: Home / Self Care | Payer: BC Managed Care – PPO | Attending: Family Medicine | Admitting: Family Medicine

## 2019-06-25 ENCOUNTER — Other Ambulatory Visit: Payer: Self-pay

## 2019-06-25 DIAGNOSIS — Z79899 Other long term (current) drug therapy: Secondary | ICD-10-CM | POA: Insufficient documentation

## 2019-06-25 DIAGNOSIS — Z87891 Personal history of nicotine dependence: Secondary | ICD-10-CM | POA: Insufficient documentation

## 2019-06-25 DIAGNOSIS — Z7982 Long term (current) use of aspirin: Secondary | ICD-10-CM | POA: Insufficient documentation

## 2019-06-25 DIAGNOSIS — I10 Essential (primary) hypertension: Secondary | ICD-10-CM | POA: Insufficient documentation

## 2019-06-25 DIAGNOSIS — N760 Acute vaginitis: Secondary | ICD-10-CM | POA: Insufficient documentation

## 2019-06-25 DIAGNOSIS — E282 Polycystic ovarian syndrome: Secondary | ICD-10-CM | POA: Insufficient documentation

## 2019-06-25 DIAGNOSIS — Z3202 Encounter for pregnancy test, result negative: Secondary | ICD-10-CM | POA: Diagnosis not present

## 2019-06-25 LAB — POCT URINALYSIS DIP (DEVICE)
Bilirubin Urine: NEGATIVE
Glucose, UA: NEGATIVE mg/dL
Hgb urine dipstick: NEGATIVE
Ketones, ur: NEGATIVE mg/dL
Nitrite: NEGATIVE
Protein, ur: NEGATIVE mg/dL
Specific Gravity, Urine: 1.025 (ref 1.005–1.030)
Urobilinogen, UA: 0.2 mg/dL (ref 0.0–1.0)
pH: 7 (ref 5.0–8.0)

## 2019-06-25 LAB — POC URINE PREG, ED: Preg Test, Ur: NEGATIVE

## 2019-06-25 MED ORDER — METRONIDAZOLE 500 MG PO TABS: 500.0000 mg | ORAL_TABLET | Freq: Two times a day (BID) | ORAL | 0 refills | Status: AC

## 2019-06-25 MED ORDER — FLUCONAZOLE 150 MG PO TABS: 150.0000 mg | ORAL_TABLET | Freq: Once | ORAL | 0 refills | Status: AC

## 2019-06-25 NOTE — Discharge Instructions (Signed)
Begin metronidazole twice daily for 1 week, take with food, no alcohol until 24 hours after last tablet 1 tab of Diflucan today, may repeat after completion of metronidazole if still having symptoms.  We are testing you for Gonorrhea, Chlamydia, Trichomonas, Yeast and Bacterial Vaginosis. We will call you if anything is positive and let you know if you require any further treatment. Please inform partners of any positive results.   Please return if symptoms not improving with treatment, development of fever, nausea, vomiting, abdominal pain.

## 2019-06-25 NOTE — ED Provider Notes (Signed)
[place]    [name]: [id number] Arrival date & time: 06/25/19  1254      History   Chief Complaint Chief Complaint  Patient presents with  . Vaginitis    HPI Colleen Stone is a 24 y.o. female presenting today for evaluation of vaginal discomfort.  Patient reports over the past 3 days she has had some abdominal cramping as well as some vaginal discomfort and itching.  She feels irritated as well as has had some discharge.  Discharge has been thick at times.  Reports one episode of dysuria, but no persistent urinary symptoms.  She reports history of yeast and BV and does feel her symptoms feel similar to what she has experienced in the past.  She is sexually active but with one partner.  Denies specific concerns for STDs.  Has Nexplanon placed and does not have regular menstrual cycles.  HPI  Past Medical History:  Diagnosis Date  . Anxiety   . Asthma   . Chlamydia   . Gonorrhea   . Hypertension    takes labetalol  . MRSA infection    8th grade  . Other acne 06/30/2008   Overview:  Acne  . PCOS (polycystic ovarian syndrome)   . Seizures ([place])    Last seizure in 2017  . Trichomonas infection     Patient Active Problem List   Diagnosis Date Noted  . Hypertension   . History of seizure disorder 06/15/2018  . Bipolar 1 disorder ([place]) 01/13/2018  . Family history of schizophrenia 01/13/2018  . Smoker 01/13/2018  . Family history of breast cancer 08/31/2012    Past Surgical History:  Procedure Laterality Date  . ADENOIDECTOMY    . NASAL SEPTOPLASTY W/ TURBINOPLASTY    . ROOT CANAL    . TONSILLECTOMY    . WISDOM TOOTH EXTRACTION      OB History    Gravida  2   Para  1   Term  1   Preterm      AB  1   Living  1     SAB  1   TAB      Ectopic      Multiple  0   Live Births  1            Home Medications    Prior to Admission medications   Medication Sig Start Date End Date Taking? Authorizing Provider  albuterol (VENTOLIN  HFA) 108 (90 Base) MCG/ACT inhaler Inhale 2 puffs into the lungs every 6 (six) hours as needed for wheezing or shortness of breath. 08/05/18  Yes Fulp, Cammie, MD  amLODipine (NORVASC) 5 MG tablet Take 1 tablet (5 mg total) by mouth daily. 02/04/19  Yes Freeman Caldron M, PA-C  albuterol (PROVENTIL) (2.5 MG/3ML) 0.083% nebulizer solution Take 3 mLs (2.5 mg total) by nebulization every 6 (six) hours as needed for wheezing or shortness of breath. 08/05/18   Fulp, Cammie, MD  aspirin EC 81 MG tablet Take 81 mg by mouth daily.    [provider]  fluconazole (DIFLUCAN) 150 MG tablet Take 1 tablet (150 mg total) by mouth once for 1 dose. 06/25/19 06/25/19  Alzada Brazee C, PA-C  hydrocortisone 2.5 % lotion Apply topically 2 (two) times daily. 06/21/19   Muthersbaugh, Jarrett Soho, PA-C  loratadine (CLARITIN) 10 MG tablet Take 1 tablet (10 mg total) by mouth daily. For nasal congestion 08/05/18   Fulp, Cammie, MD  metroNIDAZOLE (FLAGYL) 500 MG tablet Take 1 tablet (500 mg  total) by mouth 2 (two) times daily for 7 days. 06/25/19 07/02/19  Jeanifer Halliday C, PA-C  predniSONE (DELTASONE) 20 MG tablet Take 2 tablets (40 mg total) by mouth daily. 06/21/19   Muthersbaugh, Dahlia Client, PA-C  Prenatal Vit-Fe Fumarate-FA (PRENATAL MULTIVITAMIN) TABS tablet Take 1 tablet by mouth daily at 12 noon.    [provider]  cetirizine (ZYRTEC ALLERGY) 10 MG tablet Take 1 tablet (10 mg total) by mouth 2 (two) times daily. 06/21/19 06/25/19  Muthersbaugh, Dahlia Client, PA-C  famotidine (PEPCID) 20 MG tablet Take 1 tablet (20 mg total) by mouth 2 (two) times daily. 06/21/19 06/25/19  Muthersbaugh, Dahlia Client, PA-C  labetalol (NORMODYNE) 200 MG tablet Take 1 tablet (200 mg total) by mouth 2 (two) times daily. 08/05/18 06/25/19  Cain Saupe, MD    Family History Family History  Problem Relation Age of Onset  . Asthma Mother   . Heart disease Mother        murmur  . Hypertension Mother   . Heart disease Father     Social History Social  History   Tobacco Use  . Smoking status: Former Smoker    Types: Cigarettes    Quit date: 10/03/2017    Years since quitting: 1.7  . Smokeless tobacco: Never Used  Vaping Use  . Vaping Use: Never used  Substance Use Topics  . Alcohol use: Not Currently    Comment: Social  . Drug use: Not Currently    Types: Marijuana    Comment: Last marijuana 11/19     Allergies   Red dye and Latex   Review of Systems Review of Systems  Constitutional: Negative for fever.  Respiratory: Negative for shortness of breath.   Cardiovascular: Negative for chest pain.  Gastrointestinal: Positive for abdominal pain. Negative for diarrhea, nausea and vomiting.  Genitourinary: Positive for dysuria and vaginal discharge. Negative for flank pain, genital sores, hematuria, menstrual problem, vaginal bleeding and vaginal pain.  Musculoskeletal: Negative for back pain.  Skin: Negative for rash.  Neurological: Negative for dizziness, light-headedness and headaches.     Physical Exam Triage Vital Signs ED Triage Vitals  Enc Vitals Group     BP 06/25/19 1447 (!) 142/93     Pulse Rate 06/25/19 1447 (!) 108     Resp 06/25/19 1447 20     Temp 06/25/19 1447 98.5 F (36.9 C)     Temp Source 06/25/19 1447 Oral     SpO2 06/25/19 1447 98 %     Weight --      Height --      Head Circumference --      Peak Flow --      Pain Score 06/25/19 1445 0     Pain Loc --      Pain Edu? --      Excl. in GC? --    No data found.  Updated Vital Signs BP (!) 142/93 (BP Location: Right Wrist)   Pulse (!) 108   Temp 98.5 F (36.9 C) (Oral)   Resp 20   SpO2 98%   Visual Acuity Right Eye Distance:   Left Eye Distance:   Bilateral Distance:    Right Eye Near:   Left Eye Near:    Bilateral Near:     Physical Exam Vitals and nursing note reviewed.  Constitutional:      Appearance: She is well-developed.     Comments: No acute distress  HENT:     Head: Normocephalic and atraumatic.  Nose: Nose  normal.  Eyes:     Conjunctiva/sclera: Conjunctivae normal.  Cardiovascular:     Rate and Rhythm: Normal rate.  Pulmonary:     Effort: Pulmonary effort is normal. No respiratory distress.  Abdominal:     General: There is no distension.     Comments: Soft, nondistended, tenderness to palpation to right lower quadrant extending into suprapubic area, negative rebound, negative Rovsing, negative McBurney's  Musculoskeletal:        General: Normal range of motion.     Cervical back: Neck supple.  Skin:    General: Skin is warm and dry.  Neurological:     Mental Status: She is alert and oriented to person, place, and time.      UC Treatments / Results  Labs (all labs ordered are listed, but only abnormal results are displayed) Labs Reviewed  POCT URINALYSIS DIP (DEVICE) - Abnormal; Notable for the following components:      Result Value   Leukocytes,Ua SMALL (*)    All other components within normal limits  POC URINE PREG, ED  CERVICOVAGINAL ANCILLARY ONLY    EKG   Radiology No results found.  Procedures Procedures (including critical care time)  Medications Ordered in UC Medications - No data to display  Initial Impression / Assessment and Plan / UC Course  I have reviewed the triage vital signs and the nursing notes.  Pertinent labs & imaging results that were available during my care of the patient were reviewed by me and considered in my medical decision making (see chart for details).     Pregnancy test negative, UA with small leuks, otherwise unremarkable, leuks likely from discharge.  Swab pending to screen for STDs/yeast/BV.  Empirically treating for yeast and BV today with metronidazole and Diflucan.  Will alter treatment based off results.Discussed strict return precautions. Patient verbalized understanding and is agreeable with plan.  Final Clinical Impressions(s) / UC Diagnoses   Final diagnoses:  Vaginitis and vulvovaginitis     Discharge  Instructions     Begin metronidazole twice daily for 1 week, take with food, no alcohol until 24 hours after last tablet 1 tab of Diflucan today, may repeat after completion of metronidazole if still having symptoms.  We are testing you for Gonorrhea, Chlamydia, Trichomonas, Yeast and Bacterial Vaginosis. We will call you if anything is positive and let you know if you require any further treatment. Please inform partners of any positive results.   Please return if symptoms not improving with treatment, development of fever, nausea, vomiting, abdominal pain.    ED Prescriptions    Medication Sig Dispense Auth. Provider   metroNIDAZOLE (FLAGYL) 500 MG tablet Take 1 tablet (500 mg total) by mouth 2 (two) times daily for 7 days. 14 tablet Richardson Dubree C, PA-C   fluconazole (DIFLUCAN) 150 MG tablet Take 1 tablet (150 mg total) by mouth once for 1 dose. 2 tablet Blayton Huttner, [place] C, PA-C     PDMP not reviewed this encounter.   Lew Dawes, [place] 06/25/19 1508

## 2019-06-25 NOTE — ED Triage Notes (Signed)
Pt c/o vaginal discomfort for approx 2 days. Also reports vaginal itching. States she had one episode of burning on urination after taking Rx for allergic reaction.

## 2019-06-28 ENCOUNTER — Telehealth (HOSPITAL_COMMUNITY): Payer: Self-pay | Admitting: Orthopedic Surgery

## 2019-06-28 LAB — CERVICOVAGINAL ANCILLARY ONLY
Bacterial Vaginitis (gardnerella): NEGATIVE
Candida Glabrata: NEGATIVE
Candida Vaginitis: POSITIVE — AB
Chlamydia: POSITIVE — AB
Comment: NEGATIVE
Comment: NEGATIVE
Comment: NEGATIVE
Comment: NEGATIVE
Comment: NEGATIVE
Comment: NORMAL
Neisseria Gonorrhea: NEGATIVE
Trichomonas: NEGATIVE

## 2019-06-28 MED ORDER — AZITHROMYCIN 250 MG PO TABS: 1000.0000 mg | ORAL_TABLET | Freq: Once | ORAL | 0 refills | Status: AC

## 2019-07-08 ENCOUNTER — Telehealth: Payer: Self-pay | Admitting: Clinical

## 2019-07-08 NOTE — Telephone Encounter (Signed)
Second attempt to follow up with patient; Left HIPPA-compliant message to call back Asher Muir from [facility] for [employer] at [facility] at [phone] (main office) or [phone] (Olamae Ferrara's office).   If pt calls to speak to [facility], she may either see [facility] Calvert Charland at [employer] for [employer], or [facility] Jasmine at [employer].

## 2019-07-09 ENCOUNTER — Telehealth: Payer: Self-pay | Admitting: *Deleted

## 2019-07-09 NOTE — Telephone Encounter (Signed)
Colleen Stone called 07/08/19 and left a message she is trying to return a call from [facility]. Legrand Como

## 2019-07-12 ENCOUNTER — Telehealth: Payer: Self-pay | Admitting: Clinical

## 2019-07-12 NOTE — Telephone Encounter (Signed)
Information routed to [facility], LCSW.

## 2019-07-12 NOTE — Telephone Encounter (Signed)
Left HIPPA-compliant message to call back Kenta Laster from [facility] at [facility] at [phone] (main office) or [phone] (Krisanne Lich's office).   

## 2019-10-02 ENCOUNTER — Other Ambulatory Visit: Payer: Self-pay

## 2019-10-02 DIAGNOSIS — U071 COVID-19: Secondary | ICD-10-CM | POA: Diagnosis not present

## 2019-10-02 DIAGNOSIS — Z5321 Procedure and treatment not carried out due to patient leaving prior to being seen by health care provider: Secondary | ICD-10-CM | POA: Insufficient documentation

## 2019-10-02 DIAGNOSIS — G40909 Epilepsy, unspecified, not intractable, without status epilepticus: Secondary | ICD-10-CM | POA: Insufficient documentation

## 2019-10-02 NOTE — ED Notes (Signed)
Pt advised that she was feeling dizzy and lightheaded, RN notified.

## 2019-10-02 NOTE — ED Triage Notes (Signed)
Patient states she is covid positive and says she feels like she passed out in the shower today. Patient then says she has epilepsy and hasnt taken medicine since diagnosed with covid.

## 2019-10-03 ENCOUNTER — Emergency Department (HOSPITAL_COMMUNITY)
Admission: EM | Admit: 2019-10-03 | Discharge: 2019-10-03 | Disposition: A | Discharge status: Home / Self Care | Payer: BC Managed Care – PPO | Attending: Emergency Medicine | Admitting: Emergency Medicine

## 2019-12-01 ENCOUNTER — Ambulatory Visit (INDEPENDENT_AMBULATORY_CARE_PROVIDER_SITE_OTHER): Payer: BC Managed Care – PPO | Admitting: Obstetrics & Gynecology

## 2019-12-01 ENCOUNTER — Other Ambulatory Visit: Payer: Self-pay

## 2019-12-01 ENCOUNTER — Other Ambulatory Visit (HOSPITAL_COMMUNITY)
Admission: RE | Admit: 2019-12-01 | Discharge: 2019-12-01 | Disposition: A | Discharge status: Home / Self Care | Payer: BC Managed Care – PPO | Source: Ambulatory Visit | Attending: Obstetrics & Gynecology | Admitting: Obstetrics & Gynecology

## 2019-12-01 ENCOUNTER — Encounter: Payer: Self-pay | Admitting: Obstetrics & Gynecology

## 2019-12-01 VITALS — BP 147/85 | HR 87 | Wt 300.0 lb

## 2019-12-01 DIAGNOSIS — F319 Bipolar disorder, unspecified: Secondary | ICD-10-CM

## 2019-12-01 DIAGNOSIS — Z113 Encounter for screening for infections with a predominantly sexual mode of transmission: Secondary | ICD-10-CM

## 2019-12-01 DIAGNOSIS — N921 Excessive and frequent menstruation with irregular cycle: Secondary | ICD-10-CM

## 2019-12-01 DIAGNOSIS — Z975 Presence of (intrauterine) contraceptive device: Secondary | ICD-10-CM | POA: Diagnosis not present

## 2019-12-01 MED ORDER — MEDROXYPROGESTERONE ACETATE 10 MG PO TABS: 20.0000 mg | ORAL_TABLET | Freq: Every day | ORAL | 0 refills | Status: DC

## 2019-12-01 NOTE — Patient Instructions (Signed)
Etonogestrel implant What is this medicine? ETONOGESTREL (et oh noe JES trel) is a contraceptive (birth control) device. It is used to prevent pregnancy. It can be used for up to 3 years. This medicine may be used for other purposes; ask your health care provider or pharmacist if you have questions. COMMON BRAND NAME(S): Implanon, Nexplanon What should I tell my health care provider before I take this medicine? They need to know if you have any of these conditions:  abnormal vaginal bleeding  blood vessel disease or blood clots  breast, cervical, endometrial, ovarian, liver, or uterine cancer  diabetes  gallbladder disease  heart disease or recent heart attack  high blood pressure  high cholesterol or triglycerides  kidney disease  liver disease  migraine headaches  seizures  stroke  tobacco smoker  an unusual or allergic reaction to etonogestrel, anesthetics or antiseptics, other medicines, foods, dyes, or preservatives  pregnant or trying to get pregnant  breast-feeding How should I use this medicine? This device is inserted just under the skin on the inner side of your upper arm by a health care professional. Talk to your pediatrician regarding the use of this medicine in children. Special care may be needed. Overdosage: If you think you have taken too much of this medicine contact a poison control center or emergency room at once. NOTE: This medicine is only for you. Do not share this medicine with others. What if I miss a dose? This does not apply. What may interact with this medicine? Do not take this medicine with any of the following medications:  amprenavir  fosamprenavir This medicine may also interact with the following medications:  acitretin  aprepitant  armodafinil  bexarotene  bosentan  carbamazepine  certain medicines for fungal infections like fluconazole, ketoconazole, itraconazole and voriconazole  certain medicines to treat  hepatitis, HIV or AIDS  cyclosporine  felbamate  griseofulvin  lamotrigine  modafinil  oxcarbazepine  phenobarbital  phenytoin  primidone  rifabutin  rifampin  rifapentine  St. John's wort  topiramate This list may not describe all possible interactions. Give your health care provider a list of all the medicines, herbs, non-prescription drugs, or dietary supplements you use. Also tell them if you smoke, drink alcohol, or use illegal drugs. Some items may interact with your medicine. What should I watch for while using this medicine? This product does not protect you against HIV infection (AIDS) or other sexually transmitted diseases. You should be able to feel the implant by pressing your fingertips over the skin where it was inserted. Contact your doctor if you cannot feel the implant, and use a non-hormonal birth control method (such as condoms) until your doctor confirms that the implant is in place. Contact your doctor if you think that the implant may have broken or become bent while in your arm. You will receive a user card from your health care provider after the implant is inserted. The card is a record of the location of the implant in your upper arm and when it should be removed. Keep this card with your health records. What side effects may I notice from receiving this medicine? Side effects that you should report to your doctor or health care professional as soon as possible:  allergic reactions like skin rash, itching or hives, swelling of the face, lips, or tongue  breast lumps, breast tissue changes, or discharge  breathing problems  changes in emotions or moods  coughing up blood  if you feel that the implant   may have broken or bent while in your arm  high blood pressure  pain, irritation, swelling, or bruising at the insertion site  scar at site of insertion  signs of infection at the insertion site such as fever, and skin redness, pain or  discharge  signs and symptoms of a blood clot such as breathing problems; changes in vision; chest pain; severe, sudden headache; pain, swelling, warmth in the leg; trouble speaking; sudden numbness or weakness of the face, arm or leg  signs and symptoms of liver injury like dark yellow or brown urine; general ill feeling or flu-like symptoms; light-colored stools; loss of appetite; nausea; right upper belly pain; unusually weak or tired; yellowing of the eyes or skin  unusual vaginal bleeding, discharge Side effects that usually do not require medical attention (report to your doctor or health care professional if they continue or are bothersome):  acne  breast pain or tenderness  headache  irregular menstrual bleeding  nausea This list may not describe all possible side effects. Call your doctor for medical advice about side effects. You may report side effects to FDA at 1-800-FDA-1088. Where should I keep my medicine? This drug is given in a hospital or clinic and will not be stored at home. NOTE: This sheet is a summary. It may not cover all possible information. If you have questions about this medicine, talk to your doctor, pharmacist, or health care provider.  2020 Elsevier/Gold Standard (2018-10-13 11:33:04)  

## 2019-12-01 NOTE — Progress Notes (Signed)
Patient ID: Colleen Stone, female   DOB: [birth year], 24 y.o.   MRN: [id number]  Chief Complaint  Patient presents with  . Bleeding with Nexplanon    HPI Colleen Stone is a 24 y.o. female.  [id number] No LMP recorded. Patient has had an implant. She began bleeding daily 1 month ago. She has some cramps as well. If the problem can be managed and keep the Nexplanon she would prefer to do that. HPI  Past Medical History:  Diagnosis Date  . Anxiety   . Asthma   . Chlamydia   . Gonorrhea   . Hypertension    takes labetalol  . MRSA infection    8th grade  . Other acne 06/30/2008   Overview:  Acne  . PCOS (polycystic ovarian syndrome)   . Seizures ([facility])    Last seizure in 2017  . Trichomonas infection     Past Surgical History:  Procedure Laterality Date  . ADENOIDECTOMY    . NASAL SEPTOPLASTY W/ TURBINOPLASTY    . ROOT CANAL    . TONSILLECTOMY    . WISDOM TOOTH EXTRACTION      Family History  Problem Relation Age of Onset  . Asthma Mother   . Heart disease Mother        murmur  . Hypertension Mother   . Heart disease Father     Social History Social History   Tobacco Use  . Smoking status: Former Smoker    Types: Cigarettes    Quit date: 10/03/2017    Years since quitting: 2.1  . Smokeless tobacco: Never Used  Vaping Use  . Vaping Use: Never used  Substance Use Topics  . Alcohol use: Not Currently    Comment: Social  . Drug use: Not Currently    Types: Marijuana    Comment: Last marijuana 11/19    Allergies  Allergen Reactions  . Red Dye Nausea And Vomiting and Other (See Comments)    Her body rejects it  . Latex Hives and Swelling    Current Outpatient Medications  Medication Sig Dispense Refill  . albuterol (PROVENTIL) (2.5 MG/3ML) 0.083% nebulizer solution Take 3 mLs (2.5 mg total) by nebulization every 6 (six) hours as needed for wheezing or shortness of breath. 150 mL 1  . albuterol (VENTOLIN HFA) 108 (90 Base) MCG/ACT inhaler Inhale 2  puffs into the lungs every 6 (six) hours as needed for wheezing or shortness of breath. 18 g 4  . medroxyPROGESTERone (PROVERA) 10 MG tablet Take 2 tablets (20 mg total) by mouth daily. 60 tablet 0   No current facility-administered medications for this visit.    Review of Systems Review of Systems  Endocrine: Negative.   Genitourinary: Positive for menstrual problem, pelvic pain (cramps), vaginal bleeding and vaginal discharge.  Psychiatric/Behavioral: Negative.     Blood pressure (!) 147/85, pulse 87, weight 300 lb (136.1 kg), not currently breastfeeding.  Physical Exam Physical Exam Vitals and nursing note reviewed.  Constitutional:      Appearance: Normal appearance.  Pulmonary:     Effort: Pulmonary effort is normal.  Neurological:     Mental Status: She is alert.  Psychiatric:        Mood and Affect: Mood normal.        Behavior: Behavior normal.     Data Reviewed   Assessment Screening examination for STD (sexually transmitted disease) - Plan: Cervicovaginal ancillary only( [place]), Hepatitis C Antibody, Hepatitis B Surface AntiGEN, RPR, HIV Antibody (routine testing w rflx)  Breakthrough bleeding on Nexplanon - Plan: medroxyPROGESTERone (PROVERA) 10 MG tablet    Plan Provera 20 mg daily for 30 days to stop BTB Self swab done to test for STD at her request    Scheryl Darter 12/01/2019, 3:24 PM

## 2019-12-02 ENCOUNTER — Telehealth: Payer: Self-pay | Admitting: Clinical

## 2019-12-02 LAB — RPR: RPR Ser Ql: NONREACTIVE

## 2019-12-02 LAB — CERVICOVAGINAL ANCILLARY ONLY
Chlamydia: NEGATIVE
Comment: NEGATIVE
Comment: NEGATIVE
Comment: NORMAL
Neisseria Gonorrhea: NEGATIVE
Trichomonas: NEGATIVE

## 2019-12-02 LAB — HEPATITIS B SURFACE ANTIGEN: Hepatitis B Surface Ag: NEGATIVE

## 2019-12-02 LAB — HIV ANTIBODY (ROUTINE TESTING W REFLEX): HIV Screen 4th Generation wRfx: NONREACTIVE

## 2019-12-02 LAB — HEPATITIS C ANTIBODY: Hep C Virus Ab: <0.1 s/co ratio (ref 0.0–0.9)

## 2019-12-02 NOTE — Telephone Encounter (Signed)
Attempt to f/u w patient; Left HIPPA-compliant message to call back Micaiah Litle from [facility] at [facility] at  [phone] (Kayin Kettering's office).   

## 2019-12-03 ENCOUNTER — Other Ambulatory Visit: Payer: Self-pay | Admitting: Obstetrics & Gynecology

## 2019-12-03 DIAGNOSIS — N921 Excessive and frequent menstruation with irregular cycle: Secondary | ICD-10-CM

## 2019-12-06 NOTE — BH Specialist Note (Addendum)
Integrated Behavioral Health via Telemedicine Video (Caregility) Visit  12/06/2019 Colleen Stone [id number]  Number of Integrated Behavioral Health visits: 1 Session Start time: 8:17  Session End time: 9:05 Total time: 48 minutes  Referring Provider: Scheryl Darter, MD Type of Service: Individual Patient/Family location: Home [facility] Provider location: [facility]  All persons participating in visit: Patient Colleen Stone and [facility] Ruby Logiudice    I connected with Wendie Diskin  by a video enabled telemedicine application (Caregility) and verified that I am speaking with the correct person using two identifiers.   Discussed confidentiality: Yes   Confirmed demographics & insurance:  Yes   I discussed that engaging in this virtual visit, they consent to the provision of behavioral healthcare and the services will be billed under their insurance.   Patient and/or legal guardian expressed understanding and consented to virtual visit: Yes   PRESENTING CONCERNS: Patient and/or family reports the following symptoms/concerns: Pt states her primary concern today is anxiety with panic attacks, most prominent when in public, taking tests, and public speaking, along with "feeling down, just mope about", and often not leaving the house for weeks at a time.  Duration of problem: Ongoing; Severity of problem: moderately severe  STRENGTHS (Protective Factors/Coping Skills): Open to treatment   ASSESSMENT: Patient currently experiencing Bipolar 1 disorder, as previously diagnosed via psychiatry.    GOALS ADDRESSED: Patient will: 1.  Reduce symptoms of: anxiety  2.  Increase knowledge and/or ability of: self-management skills  3.  Demonstrate ability to: Increase healthy adjustment to current life circumstances   Progress of Goals: Ongoing  INTERVENTIONS: Interventions utilized:  Copywriter, advertising and  Psychoeducation and/or Health Education Standardized Assessments completed & reviewed: PHQ9/GAD7 in past two weeks   OUTCOME: Patient Response: Pt agrees to treatment plan   PLAN: 1. Follow up with behavioral health clinician on : Two weeks 2. Behavioral recommendations:  -Accept referral to psychiatry -CALM relaxation breathing exercise while sitting outside every morning; by sunniest window on bad-weather days Read educational materials regarding coping with symptoms of anxiety with panic attacks   3. Referral(s): Integrated Art gallery manager (In Clinic) and [employer] [facility] (LME/Outside Clinic)  I discussed the assessment and treatment plan with the patient and/or parent/guardian. They were provided an opportunity to ask questions and all were answered. They agreed with the plan and demonstrated an understanding of the instructions.   They were advised to call back or seek an in-person evaluation as appropriate.  I discussed that the purpose of this visit is to provide behavioral health care while limiting exposure to the novel coronavirus.  Discussed there is a possibility of technology failure and discussed alternative modes of communication if that failure occurs.  Valetta [facility]  Depression screen [facility] 2/9 12/01/2019 08/05/2018 07/29/2018 02/11/2018 01/13/2018  Decreased Interest 3 1 3  0 1  Down, Depressed, Hopeless 3 2 3  0 2  PHQ - 2 Score 6 3 6  0 3  Altered sleeping 3 3 3  0 2  Tired, decreased energy 3 3 3 1 3   Change in appetite 3 3 3  0 2  Feeling bad or failure about yourself  1 1 1  0 0  Trouble concentrating 1 3 3  0 0  Moving slowly or fidgety/restless 0 0 0 0 0  Suicidal thoughts 0 0 0 0 0  PHQ-9 Score 17 16 19 1 10    GAD 7 : Generalized Anxiety Score 12/01/2019 08/05/2018 07/29/2018 02/11/2018  Nervous, Anxious, on Edge 2 2 3 1   Control/stop worrying 3 3 3 1   Worry too much - different things 3 3 3 1   Trouble relaxing 3 3 3 1   Restless 2 2  0 0  Easily annoyed or irritable 2 2 3 1   Afraid - awful might happen 3 3 3 1   Total GAD 7 Score 18 18 18  6

## 2019-12-17 ENCOUNTER — Ambulatory Visit (INDEPENDENT_AMBULATORY_CARE_PROVIDER_SITE_OTHER): Payer: BC Managed Care – PPO | Admitting: Clinical

## 2019-12-17 DIAGNOSIS — F319 Bipolar disorder, unspecified: Secondary | ICD-10-CM | POA: Diagnosis not present

## 2019-12-17 NOTE — Patient Instructions (Signed)
[facility] [address] [place], [place] [address] [phone] (main office) [phone] (Chenay Nesmith's office)  Coping with Panic Attacks   What is a panic attack?  You may have had a panic attack if you experienced four or more of the symptoms listed below coming on abruptly and peaking in about 10 minutes.  Panic Symptoms   . Pounding heart  . Sweating  . Trembling or shaking  . Shortness of breath  . Feeling of choking  . Chest pain  . Nausea or abdominal distress    . Feeling dizzy, unsteady, lightheaded, or faint  . Feelings of unreality or being detached from yourself  . Fear of losing control or going crazy  . Fear of dying  . Numbness or tingling  . Chills or hot flashes      Panic attacks are sometimes accompanied by avoidance of certain places or situations. These are often situations that would be difficult to escape from or in which help might not be available. Examples might include crowded shopping malls, public transportation, restaurants, or driving.   Why do panic attacks occur?   Panic attacks are the body's alarm system gone awry. All of us have a built-in alarm system, powered by adrenaline, which increases our heart rate, breathing, and blood flow in response to danger. Ordinarily, this 'danger response system' works well. In some people, however, the response is either out of proportion to whatever stress is going on, or may come out of the blue without any stress at all.   For example, if you are walking in the woods and see a bear coming your way, a variety of changes occur in your body to prepare you to either fight the danger or flee from the situation. Your heart rate will increase to get more blood flow around your body, your breathing rate will quicken so that more oxygen is available, and your muscles will tighten in order to be ready to fight or run. You may feel nauseated as blood flow leaves your  stomach area and moves into your limbs. These bodily changes are all essential to helping you survive the dangerous situation. After the danger has passed, your body functions will begin to go back to normal. This is because your body also has a system for "recovering" by bringing your body back down to a normal state when the danger is over.   As you can see, the emergency response system is adaptive when there is, in fact, a "true" or "real" danger (e.g., bear). However, sometimes people find that their emergency response system is triggered in "everyday" situations where there really is no true physical danger (e.g., in a meeting, in the grocery store, while driving in normal traffic, etc.).   What triggers a panic attack?  Sometimes particularly stressful situations can trigger a panic attack. For example, an argument with your spouse or stressors at work can cause a stress response (activating the emergency response system) because you perceive it as threatening or overwhelming, even if there is no direct risk to your survival.  Sometimes panic attacks don't seem to be triggered by anything in particular- they may "come out of the blue". Somehow, the natural "fight or flight" emergency response system has gotten activated when there is no real danger. Why does the body go into "emergency mode" when there is no real danger?   Often, people with panic attacks are frightened or alarmed by the physical sensations of   the emergency response system. First, unexpected physical sensations are experienced (tightness in your chest or some shortness of breath). This then leads to feeling fearful or alarmed by these symptoms ("Something's wrong!", "Am I having a heart attack?", "Am I going to faint?") The mind perceives that there is a danger even though no real danger exists. This, in turn, activates the emergency response system ("fight or flight"), leading to a "full blown" panic attack. In summary, panic  attacks occur when we misinterpret physical symptoms as signs of impending death, craziness, loss of control, embarrassment, or fear of fear. Sometimes you may be aware of thoughts of danger that activate the emergency response system (for example, thinking "I'm having a heart attack" when you feel chest pressure or increased heart rate). At other times, however, you may not be aware of such thoughts. After several incidences of being afraid of physical sensations, anxiety and panic can occur in response to the initial sensations without conscious thoughts of danger. Instead, you just feel afraid or alarmed. In other words, the panic or fear may seem to occur "automatically" without you consciously telling yourself anything.   After having had one or more panic attacks, you may also become more focused on what is going on inside your body. You may scan your body and be more vigilant about noticing any symptoms that might signal the start of a panic attack. This makes it easier for panic attacks to happen again because you pick up on sensations you might otherwise not have noticed, and misinterpret them as something dangerous. A panic attack may then result.      How do I cope with panic attacks?  An important part of overcoming panic attacks involves re-interpreting your body's physical reactions and teaching yourself ways to decrease the physical arousal. This can be done through practicing the cognitive and behavioral interventions below.   Research has found that over half of people who have panic attacks show some signs of hyperventilation or overbreathing. This can produce initial sensations that alarm you and lead to a panic attack. Overbreathing can also develop as part of the panic attack and make the symptoms worse. When people hyperventilate, certain blood vessels in the body become narrower. In particular, the brain may get slightly less oxygen. This can lead to the symptoms of dizziness,  confusion, and lightheadedness that often occur during panic attacks. Other parts of the body may also get a bit less oxygen, which may lead to numbness or tingling in the hands or feet or the sensation of cold, clammy hands. It also may lead the heart to pump harder. Although these symptoms may be frightening and feel unpleasant, it is important to remember that hyperventilating is not dangerous. However, you can help overcome the unpleasantness of overbreathing by practicing Breathing Retraining.   Practice this basic technique three times a day, every day:  . Inhale. With your shoulders relaxed, inhale as slowly and deeply as you can while you count to six. If you can, use your diaphragm to fill your lungs with air.  . Hold. Keep the air in your lungs as you slowly count to four.  . Exhale. Slowly breath out as you count to six.  . Repeat. Do the inhale-hold-exhale cycle several times. Each time you do it, exhale for longer counts.  Like any new skill, Breathing Retraining requires practice. Try practicing this skill twice a day for several minutes. Initially, do not try this technique in specific situations or when you   become frightened or have a panic attack. Begin by practicing in a quiet environment to build up your skill level so that you can later use it in time of "emergency."   2. Decreasing Avoidance  Regardless of whether you can identify why you began having panic attacks or whether they seemed to come out of the blue, the places where you began having panic attacks often can become triggers themselves. It is not uncommon for individuals to begin to avoid the places where they have had panic attacks. Over time, the individual may begin to avoid more and more places, thereby decreasing their activities and often negatively impacting their quality of life. To break the cycle of avoidance, it is important to first identify the places or situations that are being avoided, and then to do some  "relearning."  To begin this intervention, first create a list of locations or situations that you tend to avoid. Then choose an avoided location or situation that you would like to target first. Now develop an "exposure hierarchy" for this situation or location. An "exposure hierarchy" is a list of actions that make you feel anxious in this situation. Order these actions from least to most anxiety-producing. It is often helpful to have the first item on your hierarchy involve thinking or imagining part of the feared/avoided situation.   Here is an example of an exposure hierarchy for decreasing avoidance of the grocery store. Note how it is ordered from the least amount of anxiety (at the top) to the most anxiety (at the bottom):  . Think about going to the grocery store alone.  . Go to the grocery store with a friend or family member.  . Go to the grocery store alone to pick up a few small items (5-10 minutes in the store).  . Shopping for 10-20 minutes in the store alone.  . Doing the shopping for the week by myself (20-30 minutes in the store).   Your homework is to "expose" yourself to the lowest item on your hierarchy and use your breathing relaxation and coping statements (see below) to help you remain in the situation. Practice this several times during the upcoming week. Once you have mastered each item with minimal anxiety, move on to the next higher action on your list.   Cognitive Interventions  1. Identify your negative self-talk Anxious thoughts can increase anxiety symptoms and panic. The first step in changing anxious thinking is to identify your own negative, alarming self-talk. Some common alarming thoughts:  . I'm having a heart attack.            . I must be going crazy. . I think I'm dying. . People will think I'm crazy. . I'm going to pass our.  . Oh no- here it comes.  . I can't stand this.  . I've got to get out of here!  2. Use positive coping statements Changing or  disrupting a pattern of anxious thoughts by replacing them with more calming or supportive statements can help to divert a panic attack. Some common helpful coping statements:  . This is not an emergency.  . I don't like feeling this way, but I can accept it.  . I can feel like this and still be okay.  . This has happened before, and I was okay. I'll be okay this time, too.  . I can be anxious and still deal with this situation.    /Emotional Wellbeing Apps and Websites Here are a few   free apps meant to help you to help yourself.  To find, try searching on the internet to see if the app is offered on Apple/Android devices. If your first choice doesn't come up on your device, the good news is that there are many choices! Play around with different apps to see which ones are helpful to you.    Calm This is an app meant to help increase calm feelings. Includes info, strategies, and tools for tracking your feelings.      Calm Harm  This app is meant to help with self-harm. Provides many 5-minute or 15-min coping strategies for doing instead of hurting yourself.       Healthy Minds Health Minds is a problem-solving tool to help deal with emotions and cope with stress you encounter wherever you are.      MindShift This app can help people cope with anxiety. Rather than trying to avoid anxiety, you can make an important shift and face it.      MY3  MY3 features a support system, safety plan and resources with the goal of offering a tool to use in a time of need.       My Life My Voice  This mood journal offers a simple solution for tracking your thoughts, feelings and moods. Animated emoticons can help identify your mood.       Relax Melodies Designed to help with sleep, on this app you can mix sounds and meditations for relaxation.      Smiling Mind Smiling Mind is meditation made easy: it's a simple tool that helps put a smile on your mind.        Stop, Breathe &  Think  A friendly, simple guide for people through meditations for mindfulness and compassion.  Stop, Breathe and Think Kids Enter your current feelings and choose a "mission" to help you cope. Offers videos for certain moods instead of just sound recordings.       Team Orange The goal of this tool is to help teens change how they think, act, and react. This app helps you focus on your own good feelings and experiences.      The Virtual Hope Box The Virtual Hope Box (VHB) contains simple tools to help patients with coping, relaxation, distraction, and positive thinking.     

## 2019-12-20 NOTE — BH Specialist Note (Signed)
Integrated Behavioral Health via Telemedicine Visit  12/20/2019 Colleen Stone [id number]  Number of Integrated Behavioral Health visits: 2 Session Start time: 10:25  Session End time: 10:44 Total time: 79  Referring Provider: Scheryl Darter, MD Patient/Family location: Home [facility] Provider location: [facility]  All persons participating in visit: Patient Colleen Stone and [facility] Syndey Jaskolski   Types of Service: Individual psychotherapy  I connected with Jacqulin Hankin and/or Shilee Dubray's n/a by Telephone and verified that I am speaking with the correct person using two identifiers.    Discussed confidentiality: yes  I discussed the limitations of telemedicine and the availability of in person appointments.  Discussed there is a possibility of technology failure and discussed alternative modes of communication if that failure occurs.  I discussed that engaging in this telemedicine visit, they consent to the provision of behavioral healthcare and the services will be billed under their insurance.  Patient and/or legal guardian expressed understanding and consented to Telemedicine visit: Yes   Presenting Concerns: Patient and/or family reports the following symptoms/concerns: Pt states she has had no panic attacks in over two weeks, is using relaxation breathing exercises and is currently staying with her mother for extra support.  Duration of problem: Ongoing; Severity of problem: severe  Patient and/or Family's Strengths/Protective Factors: Open to treatment; using self-coping strategies, supportive mother  Goals Addressed: Patient will: 1.  Reduce symptoms of: anxiety   Progress towards Goals: Ongoing  Interventions: Interventions utilized:  Solution-Focused Strategies Standardized Assessments completed: GAD-7 and PHQ 9  Patient and/or Family Response: Pt using self coping strategy effectively; open to  treatment via psychiatry and agrees to treatment plan  Assessment: Patient currently experiencing Bipolar 1 disorder, as previously diagnosed by psychiatry.   Patient may benefit from continued psychoeducation and brief therapeutic interventions regarding coping with symptoms of anxiety related to bipolar disorder .  Plan: 1. Follow up with behavioral health clinician on : By phone check in 3 weeks 2. Behavioral recommendations:  -Check previous voicemails to see if psychiatry has called; make sure voicemail is not full -Continue using relaxation breathing exercises as needed throughout the day (outside as able, or by sunny window on poor-weather days)  -Know that [facility] is available 24/7 for walk-ins for any future BH emergencies 3. Referral(s): Integrated Art gallery manager (In Clinic) and [facility] (LME/Outside Clinic)  I discussed the assessment and treatment plan with the patient and/or parent/guardian. They were provided an opportunity to ask questions and all were answered. They agreed with the plan and demonstrated an understanding of the instructions.   They were advised to call back or seek an in-person evaluation if the symptoms worsen or if the condition fails to improve as anticipated.  Rae Lips, LCSW   Depression screen [facility] 2/9 12/31/2019 12/01/2019 08/05/2018 07/29/2018 02/11/2018  Decreased Interest 1 3 1 3  0  Down, Depressed, Hopeless 1 3 2 3  0  PHQ - 2 Score 2 6 3 6  0  Altered sleeping 1 3 3 3  0  Tired, decreased energy 1 3 3 3 1   Change in appetite 3 3 3 3  0  Feeling bad or failure about yourself  1 1 1 1  0  Trouble concentrating 3 1 3 3  0  Moving slowly or fidgety/restless 0 0 0 0 0  Suicidal thoughts 0 0 0 0 0  PHQ-9 Score 11 17 16 19 1    GAD 7 :  Generalized Anxiety Score 12/31/2019 12/01/2019 08/05/2018 07/29/2018  Nervous, Anxious, on Edge 3 2 2 3   Control/stop worrying 3 3 3  3   Worry too much - different things 3 3 3 3   Trouble relaxing 3 3 3 3   Restless 3 2 2  0  Easily annoyed or irritable 3 2 2 3   Afraid - awful might happen 1 3 3 3   Total GAD 7 Score 19 18 18  18

## 2019-12-31 ENCOUNTER — Ambulatory Visit (INDEPENDENT_AMBULATORY_CARE_PROVIDER_SITE_OTHER): Payer: BC Managed Care – PPO | Admitting: Clinical

## 2019-12-31 DIAGNOSIS — F319 Bipolar disorder, unspecified: Secondary | ICD-10-CM

## 2019-12-31 NOTE — Patient Instructions (Signed)
[facility] °[address] °[place], [place] [address] °[phone] (main office) °[phone] (Nicolis Boody's office) ° °Behavioral Health Resources:  ° °What if I or someone I know is in crisis? ° °If you are thinking about harming yourself or having thoughts of suicide, or if you know someone who is, seek help right away. ° °Call your doctor or mental health care provider. ° °Call 911 or go to a hospital emergency room to get immediate help, or ask a friend or family member to help you do these things; IF YOU ARE IN [place], YOU MAY GO TO WALK-IN URGENT CARE 24/7 at [facility] (see below) ° °Call the [place] National Suicide Prevention Lifeline’s toll-free, 24-hour hotline at 1-800-273-TALK (1-800-273-8255) or TTY: 1-800-799-4 TTY (1-800-799-4889) to talk to a trained counselor. ° °If you are in crisis, make sure you are not left alone.  ° °If someone else is in crisis, make sure he or she is not left alone ° ° °24 Hour :  ° °[facility]  °[address], [place], [place] [address] 800-711-2635 or [phone] °WALK-IN URGENT CARE 24/7 ° °[facility] Mobile Crisis: 1-877-626-1772 ° °[place] National Suicide Hotline: 1-800-273-8255 ° °[facility] Crisis [facility] °(Domestic Violence, Rape & Victim Assistance)  336-273-7273 ° °[facility]  °[address] [place], [place]  [address]   1-855-788-8787 or 336-676-6840  ° °[facility] [place] [facility]: 336-899-1505 (8am-4pm) or 1-866- 261-5769 (after hours)      ° ° °[facility] °24/7 Walk-in Clinic, [address], [place], [place]  [phone] °Fax: [phone] [email or web address] °*Interpreters available °*Accepts all insurance and uninsured for Urgent Care needs °*Accepts Medicaid and uninsured for outpatient treatment  ° °[facility]   °Mon-Fri: 8am-5pm °[address], [place], [place] 336-272-0855(phone); 336-272-9885(fax) [email or web address]  °*Accepts Medicare ° °[facility] °Mon, Tues, Thurs, Fri: 8am-4pm °[address], [place], [place] [address] °[phone] (phone); 336-292-0679 (fax) °[facility]  °*Accepts Medicare ° °[facility] °Mon-Fri: 9am-5pm  °[address], [place], [place] °[phone] (phone); 336-540-9454  °[email or web address]  °*Accepts Medicaid ° °[facility] °[address], [place], [place]  °336-274-2040 °http://[email or web address]  ° °[facility] °Mon-Fri, 8:30am-12pm/1pm-2:30pm °[address], [place], [place] [phone] (phone); 336-387-9167 (fax) °www.[facility].org  °*Accepts Medicaid, sliding-scale*Bilingual services available ° °[facility] °Mon-Fri, 8am-7pm °[address], [place], [place]  °[phone](phone); 336-899-8811(fax) °[facility]org  °*Accepts Medicaid *Bilingual services available ° °[facility] °Mon-Fri: 8am-5pm, Saturday by appointment only °[address], [place], [place] °336-294-1349 (phone); 336-292-6711 (fax) °www.[email or web address]  ° °[facility] °[address], [place], [place] °336-429-5600 °[facility]  °*Free & reduced services for uninsured and underinsured individuals °*Bilingual services for Spanish-speaking clients 21 and under ° °[facility] °24/7 Walk-in Clinic, [address], [place], [place] °336-676-6409(phone); 336-676-6409(fax) °www.[facility]  °*Bring your own interpreter at first visit °*Accepts Medicare and Medicaid ° °[facility] °Mon-Fri: 9am-5:30pm °[address], [address], , [place] °[phone] (phone), [phone] (fax) °After hours crisis line:  336-763-1165 °[facility]  °*Accepts Medicare and Medicaid ° °[facility] °Mon-Thurs, 8am-6pm °[address], , [place]  °336-288-1484 (phone); 336-288-0738 (fax) °[facility]://[facility]  °*Subsidized costs available ° °[facility] °Mon-Fri: 8am-4pm °[address], ,  °336-834-9664(phone); 336-834-9698(fax) °[facility]com  °*Accepts Medicaid ° °[facility] [place] [facility] °Same day access hours: Mon-Fri, 8:30-3pm °Crisis hours: Mon-Fri,   8am-5pm °[address], [place], [place] °([phone]) [phone] ° °[facility] °Same day access hours: Mon-Fri, 8:30-3pm °Crisis hours: Mon-Fri, 8am-8pm °[address], [place], [place] °[phone] (phone); 336-899-1513 (fax) °[facility]  °*Accepts Medicaid and Medicare ° °[facility] °Mon, Wed, Fri: 9am-9pm °Tues, Thurs: 9am-6pm °[address], [place], [place]  °[phone] (phone); 336-379-7145 (fax) °[facility]://[email or web address]  °*(Accepts Medicare and Medicaid; payment plans available)*Bilingual services available ° °[facility] °[address], [place], [place] °336-542-2076 (phone); 336-272-1182 (fax) °[facility]  ° °[facility] °[address], [place], [place]  °[phone]  °[facility]  °*Bilingual services available ° °[facility] °Mon-Thurs: 8am-8pm °[address], [place], [place] °336-285-7173 (phone); 336-285-7174 (fax) °https://[email or web address]/[email or web address]  °*Accepts Medicaid*Bilingual services available ° °[facility] °[address] [place], [place] °336-617-8910 (phone) °https://[email or web address].[email or web address]  °*Accepts Medicaid °*Bilingual services available ° °[facility] °Mon-Fri, 9am-4:45pm °[address], [place], [place] °336-288-9190 (phone); 336-450-4318  (fax) °http://[facility].com  °*Accepts Medicare ° °[facility] °Mon-Thurs: 8:30-8pm, Fri: 8:30am-7pm °[address], [place], [place] (3rd floor) °336-334-5662 (phone); 336-334-5754 (fax) °[facility]://[email or web address]  °*Accepts Medicaid; income-based reduced rates available ° °[facility] °Mon-Fri: 8am-5pm °[address], [place], [place] [address] °336-542-2884 (phone); 336-542-2885 (fax) °http://[email or web address]  °*Accepts Medicaid*Bilingual services available ° ° °[facility]  °[address],  °336-373-1402 www.[facility]ag.org  °*Provides direct services to individuals in recovery from mental illness, including support groups, recovery skills classes, and one on one peer support ° °[facility] ([facility]) °[place] [facility] helpline: 336-370-4264  °[facility]MI  helpline: 1-800-451-9682 °https://[facility].org  °*A community hub for information relating to local resources and services for the friends and families of individuals living alongside a mental health condition, as well as the individuals themselves. Classes and support groups also provided  ° ° °

## 2020-01-19 ENCOUNTER — Telehealth: Payer: Self-pay | Admitting: Clinical

## 2020-01-19 NOTE — Telephone Encounter (Signed)
Attempt to phone follow up with pt, as agreed; Left HIPPA-compliant message to call back Asher Muir from [employer] for [employer] at [facility] at  [phone] [facility] office).

## 2020-02-17 ENCOUNTER — Telehealth: Payer: Self-pay | Admitting: Family Medicine

## 2020-02-17 NOTE — Telephone Encounter (Signed)
Called pt and left message that I am returning her call if she continues to have questions or concerns to please give the office a call back.   Addison Naegeli, RN  02/17/20

## 2020-02-17 NOTE — Telephone Encounter (Signed)
Pt states she was speaking to a nurse and the RN told her to come into the office w/in the hour per heavy bleeding. I do not have any notes for the call or nurse notes, pt wants to speak with someone ASAP due to concerns from RN she spoke with. are you avail

## 2020-04-18 ENCOUNTER — Ambulatory Visit (INDEPENDENT_AMBULATORY_CARE_PROVIDER_SITE_OTHER): Payer: Medicaid Other | Admitting: Obstetrics

## 2020-04-18 ENCOUNTER — Other Ambulatory Visit (HOSPITAL_COMMUNITY)
Admission: RE | Admit: 2020-04-18 | Discharge: 2020-04-18 | Disposition: A | Discharge status: Home / Self Care | Payer: Medicaid Other | Source: Ambulatory Visit | Attending: Obstetrics | Admitting: Obstetrics

## 2020-04-18 ENCOUNTER — Encounter: Payer: Self-pay | Admitting: Obstetrics

## 2020-04-18 ENCOUNTER — Other Ambulatory Visit: Payer: Self-pay

## 2020-04-18 VITALS — BP 128/80 | HR 83 | Wt 299.0 lb

## 2020-04-18 DIAGNOSIS — N898 Other specified noninflammatory disorders of vagina: Secondary | ICD-10-CM

## 2020-04-18 DIAGNOSIS — J301 Allergic rhinitis due to pollen: Secondary | ICD-10-CM

## 2020-04-18 DIAGNOSIS — Z3046 Encounter for surveillance of implantable subdermal contraceptive: Secondary | ICD-10-CM | POA: Diagnosis not present

## 2020-04-18 MED ORDER — LORATADINE 10 MG PO TABS: 10.0000 mg | ORAL_TABLET | Freq: Every day | ORAL | 11 refills | Status: DC

## 2020-04-18 NOTE — Progress Notes (Signed)
NEXPLANON REMOVAL NOTE  Date of LMP:   unknown  Contraception used: *Nexplanon   Indications:  The patient desires removal of Nexplanon.  She understands risks, benefits, and alternatives to Implanon and would like to proceed.  Anesthesia:   Lidocaine 1% plain.  Procedure:  A time-out was performed confirming the procedure and the patient's allergy status.  Complications: None                      The rod was palpated and the area was sterilely prepped.  The area beneath the distal tip was anesthetized with 1% xylocaine and the skin incised                       Over the tip and the tip was exposed, grasped with forcep and removed intact.  A single suture of 4-0 Vicryl was used to close incision.  Steri strip                       And a bandage applied and the arm was wrapped with gauze bandage.  The patient tolerated well.  Instructions:  The patient was instructed to remove the dressing in 24 hours and that some bruising is to be expected.  She was advised to use over the counter analgesics as needed for any pain at the site.  She is to keep the area dry for 24 hours and to call if her hand or arm becomes cold, numb, or blue.  Return visit:  Return in 2 weeks    Brock Bad, MD 04/18/2020 4:10 PM

## 2020-04-18 NOTE — Progress Notes (Signed)
RGYN patient presents for Nexplanon removal. Inserted : 07/07/2018  Per notes pt requesting STD testing pt made aware ins may not cover all requested labs.   Pt elects to proceed w/ STD screening and test for BV and yeast pt notes discharge.   LMP: Irregular    Pt declines any contraception at this time.   pt states she needs help with finding a PCP

## 2020-04-19 ENCOUNTER — Other Ambulatory Visit: Payer: Self-pay | Admitting: Obstetrics

## 2020-04-19 DIAGNOSIS — N760 Acute vaginitis: Secondary | ICD-10-CM

## 2020-04-19 DIAGNOSIS — B373 Candidiasis of vulva and vagina: Secondary | ICD-10-CM

## 2020-04-19 DIAGNOSIS — B9689 Other specified bacterial agents as the cause of diseases classified elsewhere: Secondary | ICD-10-CM

## 2020-04-19 DIAGNOSIS — A749 Chlamydial infection, unspecified: Secondary | ICD-10-CM

## 2020-04-19 DIAGNOSIS — B3731 Acute candidiasis of vulva and vagina: Secondary | ICD-10-CM

## 2020-04-19 LAB — CERVICOVAGINAL ANCILLARY ONLY
Bacterial Vaginitis (gardnerella): POSITIVE — AB
Candida Glabrata: NEGATIVE
Candida Vaginitis: NEGATIVE
Chlamydia: POSITIVE — AB
Comment: NEGATIVE
Comment: NEGATIVE
Comment: NEGATIVE
Comment: NEGATIVE
Comment: NEGATIVE
Comment: NORMAL
Neisseria Gonorrhea: NEGATIVE
Trichomonas: NEGATIVE

## 2020-04-19 MED ORDER — METRONIDAZOLE 500 MG PO TABS: 500.0000 mg | ORAL_TABLET | Freq: Two times a day (BID) | ORAL | 2 refills | Status: DC

## 2020-04-19 MED ORDER — DOXYCYCLINE HYCLATE 100 MG PO CAPS: 100.0000 mg | ORAL_CAPSULE | Freq: Two times a day (BID) | ORAL | 0 refills | Status: DC

## 2020-04-19 MED ORDER — FLUCONAZOLE 150 MG PO TABS: 150.0000 mg | ORAL_TABLET | Freq: Once | ORAL | 0 refills | Status: AC

## 2020-05-09 ENCOUNTER — Other Ambulatory Visit: Payer: Self-pay

## 2020-05-09 ENCOUNTER — Encounter: Payer: Self-pay | Admitting: Obstetrics

## 2020-05-09 ENCOUNTER — Ambulatory Visit (INDEPENDENT_AMBULATORY_CARE_PROVIDER_SITE_OTHER): Payer: Medicaid Other | Admitting: Obstetrics

## 2020-05-09 VITALS — BP 136/89 | HR 99 | Wt 297.0 lb

## 2020-05-09 DIAGNOSIS — Z0189 Encounter for other specified special examinations: Secondary | ICD-10-CM

## 2020-05-09 DIAGNOSIS — Z9889 Other specified postprocedural states: Secondary | ICD-10-CM | POA: Diagnosis not present

## 2020-05-09 LAB — POCT URINE PREGNANCY: Preg Test, Ur: NEGATIVE

## 2020-05-09 NOTE — Progress Notes (Signed)
Patient ID: Colleen Stone, female   DOB: August 03, 1995, 25 y.o.   MRN: [id number]  Chief Complaint  Patient presents with  . Follow-up    HPI Colleen Stone is a 25 y.o. female.  Presents for 2 week check-up after removal of Nexplanon.  No complaints. HPI  Past Medical History:  Diagnosis Date  . Anxiety   . Asthma   . Chlamydia   . Gonorrhea   . Hypertension    takes labetalol  . MRSA infection    8th grade  . Other acne 06/30/2008   Overview:  Acne  . PCOS (polycystic ovarian syndrome)   . Seizures (HCC)    Last seizure in 2017  . Trichomonas infection     Past Surgical History:  Procedure Laterality Date  . ADENOIDECTOMY    . NASAL SEPTOPLASTY W/ TURBINOPLASTY    . ROOT CANAL    . TONSILLECTOMY    . WISDOM TOOTH EXTRACTION      Family History  Problem Relation Age of Onset  . Asthma Mother   . Heart disease Mother        murmur  . Hypertension Mother   . Heart disease Father     Social History Social History   Tobacco Use  . Smoking status: Former Smoker    Types: Cigarettes    Quit date: 10/03/2017    Years since quitting: 2.6  . Smokeless tobacco: Never Used  Vaping Use  . Vaping Use: Never used  Substance Use Topics  . Alcohol use: Not Currently    Comment: Social  . Drug use: Not Currently    Types: Marijuana    Comment: Last marijuana 11/19    Allergies  Allergen Reactions  . Red Dye Nausea And Vomiting and Other (See Comments)    Her body rejects it  . Latex Hives and Swelling    Current Outpatient Medications  Medication Sig Dispense Refill  . albuterol (PROVENTIL) (2.5 MG/3ML) 0.083% nebulizer solution Take 3 mLs (2.5 mg total) by nebulization every 6 (six) hours as needed for wheezing or shortness of breath. 150 mL 1  . albuterol (VENTOLIN HFA) 108 (90 Base) MCG/ACT inhaler Inhale 2 puffs into the lungs every 6 (six) hours as needed for wheezing or shortness of breath. 18 g 4  . doxycycline (VIBRAMYCIN) 100 MG capsule Take 1  capsule (100 mg total) by mouth 2 (two) times daily. 14 capsule 0  . loratadine (CLARITIN) 10 MG tablet Take 1 tablet (10 mg total) by mouth daily. 30 tablet 11  . medroxyPROGESTERone (PROVERA) 10 MG tablet Take 2 tablets (20 mg total) by mouth daily. 60 tablet 0  . metroNIDAZOLE (FLAGYL) 500 MG tablet Take 1 tablet (500 mg total) by mouth 2 (two) times daily. 14 tablet 2   No current facility-administered medications for this visit.    Review of Systems Review of Systems Constitutional: negative for fatigue and weight loss Respiratory: negative for cough and wheezing Cardiovascular: negative for chest pain, fatigue and palpitations Gastrointestinal: negative for abdominal pain and change in bowel habits Genitourinary:negative Integument/breast: negative for nipple discharge Musculoskeletal:negative for myalgias Neurological: negative for gait problems and tremors Behavioral/Psych: negative for abusive relationship, depression Endocrine: negative for temperature intolerance      Blood pressure 136/89, pulse 99, weight 297 lb (134.7 kg), not currently breastfeeding.  Physical Exam Physical Exam General:   alert and no distress  Skin:   no rash or abnormalities  Lungs:   clear to auscultation bilaterally  Heart:  regular rate and rhythm, S1, S2 normal, no murmur, click, rub or gallop  Breasts:   not examined              Left Upper Extremity:  Nexplanon removal site is clean, dry and intact.  Non tender.  I have spent a total of 15 minutes of face-to-face time, excluding clinical staff time, reviewing notes and preparing to see patient, ordering tests and/or medications, and counseling the patient.   Data Reviewed Labs  Assessment     1. Post-operative state - doing well     Plan   Follow up prn  No orders of the defined types were placed in this encounter.  No orders of the defined types were placed in this encounter.    Brock Bad, MD 05/09/2020 4:53 PM

## 2020-05-09 NOTE — Addendum Note (Signed)
Addended by: Marya Landry D on: 05/09/2020 05:09 PM   Modules accepted: Orders

## 2020-05-09 NOTE — Progress Notes (Signed)
RGYN presents for F/U visit today form Nexplanon removal.  +CT on 04/18/20 .

## 2020-05-10 ENCOUNTER — Other Ambulatory Visit: Payer: Self-pay

## 2020-05-10 ENCOUNTER — Encounter (HOSPITAL_COMMUNITY): Payer: Self-pay | Admitting: Emergency Medicine

## 2020-05-10 ENCOUNTER — Emergency Department (HOSPITAL_COMMUNITY)
Admission: EM | Admit: 2020-05-10 | Discharge: 2020-05-10 | Disposition: A | Discharge status: Home / Self Care | Payer: Medicaid Other | Attending: Emergency Medicine | Admitting: Emergency Medicine

## 2020-05-10 DIAGNOSIS — Z79899 Other long term (current) drug therapy: Secondary | ICD-10-CM | POA: Diagnosis not present

## 2020-05-10 DIAGNOSIS — R197 Diarrhea, unspecified: Secondary | ICD-10-CM | POA: Diagnosis present

## 2020-05-10 DIAGNOSIS — E282 Polycystic ovarian syndrome: Secondary | ICD-10-CM | POA: Diagnosis not present

## 2020-05-10 DIAGNOSIS — Z91040 Latex allergy status: Secondary | ICD-10-CM | POA: Insufficient documentation

## 2020-05-10 DIAGNOSIS — Z87891 Personal history of nicotine dependence: Secondary | ICD-10-CM | POA: Insufficient documentation

## 2020-05-10 DIAGNOSIS — Z8249 Family history of ischemic heart disease and other diseases of the circulatory system: Secondary | ICD-10-CM | POA: Diagnosis not present

## 2020-05-10 DIAGNOSIS — J45909 Unspecified asthma, uncomplicated: Secondary | ICD-10-CM | POA: Insufficient documentation

## 2020-05-10 DIAGNOSIS — A09 Infectious gastroenteritis and colitis, unspecified: Secondary | ICD-10-CM | POA: Insufficient documentation

## 2020-05-10 DIAGNOSIS — I10 Essential (primary) hypertension: Secondary | ICD-10-CM | POA: Diagnosis not present

## 2020-05-10 DIAGNOSIS — Z793 Long term (current) use of hormonal contraceptives: Secondary | ICD-10-CM | POA: Insufficient documentation

## 2020-05-10 DIAGNOSIS — A749 Chlamydial infection, unspecified: Secondary | ICD-10-CM

## 2020-05-10 LAB — COMPREHENSIVE METABOLIC PANEL
ALT: 19 U/L (ref 0–44)
AST: 17 U/L (ref 15–41)
Albumin: 3.7 g/dL (ref 3.5–5.0)
Alkaline Phosphatase: 100 U/L (ref 38–126)
Anion gap: 7 (ref 5–15)
BUN: 6 mg/dL (ref 6–20)
CO2: 22 mmol/L (ref 22–32)
Calcium: 9 mg/dL (ref 8.9–10.3)
Chloride: 107 mmol/L (ref 98–111)
Creatinine, Ser: 0.58 mg/dL (ref 0.44–1.00)
GFR, Estimated: >60 mL/min (ref >60)
Glucose, Bld: 84 mg/dL (ref 70–99)
Potassium: 3.5 mmol/L (ref 3.5–5.1)
Sodium: 136 mmol/L (ref 135–145)
Total Bilirubin: 0.6 mg/dL (ref 0.3–1.2)
Total Protein: 7.5 g/dL (ref 6.5–8.1)

## 2020-05-10 LAB — CBC WITH DIFFERENTIAL/PLATELET
Abs Immature Granulocytes: 0.01 10*3/uL (ref 0.00–0.07)
Basophils Absolute: 0 10*3/uL (ref 0.0–0.1)
Basophils Relative: 0 %
Eosinophils Absolute: 0 10*3/uL (ref 0.0–0.5)
Eosinophils Relative: 1 %
HCT: 42.7 % (ref 36.0–46.0)
Hemoglobin: 13.5 g/dL (ref 12.0–15.0)
Immature Granulocytes: 0 %
Lymphocytes Relative: 36 %
Lymphs Abs: 2.2 10*3/uL (ref 0.7–4.0)
MCH: 25.1 pg — ABNORMAL LOW (ref 26.0–34.0)
MCHC: 31.6 g/dL (ref 30.0–36.0)
MCV: 79.5 fL — ABNORMAL LOW (ref 80.0–100.0)
Monocytes Absolute: 0.3 10*3/uL (ref 0.1–1.0)
Monocytes Relative: 5 %
Neutro Abs: 3.5 10*3/uL (ref 1.7–7.7)
Neutrophils Relative %: 58 %
Platelets: 355 10*3/uL (ref 150–400)
RBC: 5.37 MIL/uL — ABNORMAL HIGH (ref 3.87–5.11)
RDW: 16.7 % — ABNORMAL HIGH (ref 11.5–15.5)
WBC: 6.1 10*3/uL (ref 4.0–10.5)
nRBC: 0 % (ref 0.0–0.2)

## 2020-05-10 LAB — URINALYSIS, ROUTINE W REFLEX MICROSCOPIC
Bilirubin Urine: NEGATIVE
Glucose, UA: NEGATIVE mg/dL
Hgb urine dipstick: NEGATIVE
Ketones, ur: 20 mg/dL — AB
Nitrite: NEGATIVE
Protein, ur: NEGATIVE mg/dL
Specific Gravity, Urine: 1.024 (ref 1.005–1.030)
pH: 5 (ref 5.0–8.0)

## 2020-05-10 LAB — I-STAT BETA HCG BLOOD, ED (MC, WL, AP ONLY): I-stat hCG, quantitative: <5 m[IU]/mL (ref <5)

## 2020-05-10 LAB — TYPE AND SCREEN
ABO/RH(D): A POS
Antibody Screen: NEGATIVE

## 2020-05-10 LAB — POC OCCULT BLOOD, ED: Fecal Occult Bld: POSITIVE — AB

## 2020-05-10 LAB — LIPASE, BLOOD: Lipase: 25 U/L (ref 11–51)

## 2020-05-10 LAB — C DIFFICILE QUICK SCREEN W PCR REFLEX
C Diff antigen: NEGATIVE
C Diff interpretation: NOT DETECTED
C Diff toxin: NEGATIVE

## 2020-05-10 MED ORDER — CIPROFLOXACIN HCL 500 MG PO TABS
500.0000 mg | ORAL_TABLET | Freq: Once | ORAL | Status: AC
  Administered 2020-05-10: 500 mg via ORAL
  Filled 2020-05-10: qty 1

## 2020-05-10 MED ORDER — LACTATED RINGERS IV BOLUS
1500.0000 mL | Freq: Once | INTRAVENOUS | Status: AC
  Administered 2020-05-10: 1500 mL via INTRAVENOUS

## 2020-05-10 MED ORDER — CIPROFLOXACIN HCL 500 MG PO TABS: 500.0000 mg | ORAL_TABLET | Freq: Two times a day (BID) | ORAL | 0 refills | Status: AC

## 2020-05-10 MED ORDER — ONDANSETRON HCL 4 MG/2ML IJ SOLN
4.0000 mg | Freq: Once | INTRAMUSCULAR | Status: AC
  Administered 2020-05-10: 4 mg via INTRAVENOUS
  Filled 2020-05-10: qty 2

## 2020-05-10 MED ORDER — ONDANSETRON 4 MG PO TBDP: 4.0000 mg | ORAL_TABLET | Freq: Three times a day (TID) | ORAL | 0 refills | Status: DC | PRN

## 2020-05-10 MED ORDER — LACTINEX PO CHEW
1.0000 | CHEWABLE_TABLET | Freq: Three times a day (TID) | ORAL | Status: AC
  Administered 2020-05-10: 1 via ORAL
  Filled 2020-05-10: qty 1

## 2020-05-10 MED ORDER — LACTINEX PO CHEW: 1.0000 | CHEWABLE_TABLET | Freq: Three times a day (TID) | ORAL | 1 refills | Status: AC

## 2020-05-10 NOTE — ED Provider Notes (Incomplete)
[facility] EMERGENCY DEPARTMENT Provider Note   [name]: [id number] Arrival date & time: 05/10/20  0932     History No chief complaint on file.   Colleen Stone is a 25 y.o. female.  25 year old female with a history of hypertension, PCOS presents to the emergency department for evaluation of nausea, vomiting, diarrhea.  Her symptoms began 4 days ago with abdominal pain and diarrhea.  She has developed nausea and vomiting over the past 48 hours.  Describes her diarrhea as watery mixed with blood and mucous; too numerous to count episodes daily.  Usually has a bowel movement shortly after any oral intake.  Also notes associated worsening of her abdominal cramping when she has a bowel movement; however BM does not relieve her pain.  Nausea and vomiting waxing and waning.  She has had ~4 episodes of NB/NB emesis per day.  Has been eating Jell-O for symptoms without relief.  Denies fevers, chest pain, SOB, syncope, hx of abdominal surgeries.  Does have a hx of abx use earlier this month.  No recent travel.  Eats sushi daily for lunch as the restaurant is located near her work.  The history is provided by the patient. No language interpreter was used.       Past Medical History:  Diagnosis Date  . Anxiety   . Asthma   . Chlamydia   . Gonorrhea   . Hypertension    takes labetalol  . MRSA infection    8th grade  . Other acne 06/30/2008   Overview:  Acne  . PCOS (polycystic ovarian syndrome)   . Seizures (HCC)    Last seizure in 2017  . Trichomonas infection     Patient Active Problem List   Diagnosis Date Noted  . Hypertension   . History of seizure disorder 06/15/2018  . Bipolar 1 disorder (HCC) 01/13/2018  . Family history of schizophrenia 01/13/2018  . Smoker 01/13/2018  . Atypical squamous cells of undetermined significance (ASCUS) on Papanicolaou smear of cervix 01/26/2013  . Family history of breast cancer 08/31/2012    Past Surgical History:   Procedure Laterality Date  . ADENOIDECTOMY    . NASAL SEPTOPLASTY W/ TURBINOPLASTY    . ROOT CANAL    . TONSILLECTOMY    . WISDOM TOOTH EXTRACTION       OB History    Gravida  2   Para  1   Term  1   Preterm      AB  1   Living  1     SAB  1   IAB      Ectopic      Multiple  0   Live Births  1           Family History  Problem Relation Age of Onset  . Asthma Mother   . Heart disease Mother        murmur  . Hypertension Mother   . Heart disease Father     Social History   Tobacco Use  . Smoking status: Former Smoker    Types: Cigarettes    Quit date: 10/03/2017    Years since quitting: 2.6  . Smokeless tobacco: Never Used  Vaping Use  . Vaping Use: Never used  Substance Use Topics  . Alcohol use: Not Currently    Comment: Social  . Drug use: Not Currently    Types: Marijuana    Comment: Last marijuana 11/19    Home Medications Prior to Admission  medications   Medication Sig Start Date End Date Taking? Authorizing Provider  ciprofloxacin (CIPRO) 500 MG tablet Take 1 tablet (500 mg total) by mouth 2 (two) times daily for 5 days. 05/10/20 05/15/20 Yes Antony Madura, PA-C  lactobacillus acidophilus & bulgar (LACTINEX) chewable tablet Chew 1 tablet by mouth 3 (three) times daily with meals for 15 days. 05/10/20 05/25/20 Yes Antony Madura, PA-C  ondansetron (ZOFRAN ODT) 4 MG disintegrating tablet Take 1 tablet (4 mg total) by mouth every 8 (eight) hours as needed for nausea or vomiting. 05/10/20  Yes Antony Madura, PA-C  albuterol (PROVENTIL) (2.5 MG/3ML) 0.083% nebulizer solution Take 3 mLs (2.5 mg total) by nebulization every 6 (six) hours as needed for wheezing or shortness of breath. 08/05/18   Fulp, Cammie, MD  albuterol (VENTOLIN HFA) 108 (90 Base) MCG/ACT inhaler Inhale 2 puffs into the lungs every 6 (six) hours as needed for wheezing or shortness of breath. 08/05/18   Fulp, Cammie, MD  loratadine (CLARITIN) 10 MG tablet Take 1 tablet (10 mg total) by  mouth daily. 04/18/20   Brock Bad, MD  medroxyPROGESTERone (PROVERA) 10 MG tablet Take 2 tablets (20 mg total) by mouth daily. 12/01/19   Adam Phenix, MD  cetirizine (ZYRTEC ALLERGY) 10 MG tablet Take 1 tablet (10 mg total) by mouth 2 (two) times daily. 06/21/19 06/25/19  Muthersbaugh, Dahlia Client, PA-C  famotidine (PEPCID) 20 MG tablet Take 1 tablet (20 mg total) by mouth 2 (two) times daily. 06/21/19 06/25/19  Muthersbaugh, Dahlia Client, PA-C  labetalol (NORMODYNE) 200 MG tablet Take 1 tablet (200 mg total) by mouth 2 (two) times daily. 08/05/18 06/25/19  Fulp, Hewitt Shorts, MD    Allergies    Red dye and Latex  Review of Systems   Review of Systems  Ten systems reviewed and are negative for acute change, except as noted in the HPI.    Physical Exam Updated Vital Signs BP 97/85   Pulse 77   Temp 98.4 F (36.9 C) (Oral)   Resp 15   SpO2 99%   Physical Exam Vitals and nursing note reviewed.  Constitutional:      General: She is not in acute distress.    Appearance: She is well-developed. She is not diaphoretic.     Comments: Nontoxic appearing and in NAD  HENT:     Head: Normocephalic and atraumatic.  Eyes:     General: No scleral icterus.    Conjunctiva/sclera: Conjunctivae normal.  Cardiovascular:     Rate and Rhythm: Normal rate and regular rhythm.     Pulses: Normal pulses.  Pulmonary:     Effort: Pulmonary effort is normal. No respiratory distress.     Comments: Respirations even and unlabored Abdominal:     Palpations: Abdomen is soft.     Tenderness: There is abdominal tenderness. There is no guarding.     Comments: TTP in the RUQ, epigastrium, RLQ. Abdomen soft, obese. No peritoneal signs, palpable masses, guarding.  Musculoskeletal:        General: Normal range of motion.     Cervical back: Normal range of motion.  Skin:    General: Skin is warm and dry.     Coloration: Skin is not pale.     Findings: No erythema or rash.  Neurological:     Mental Status: She is alert  and oriented to person, place, and time.  Psychiatric:        Behavior: Behavior normal.     ED Results / Procedures / Treatments  Labs (all labs ordered are listed, but only abnormal results are displayed) Labs Reviewed  CBC WITH DIFFERENTIAL/PLATELET - Abnormal; Notable for the following components:      Result Value   RBC 5.37 (*)    MCV 79.5 (*)    MCH 25.1 (*)    RDW 16.7 (*)    All other components within normal limits  URINALYSIS, ROUTINE W REFLEX MICROSCOPIC - Abnormal; Notable for the following components:   APPearance HAZY (*)    Ketones, ur 20 (*)    Leukocytes,Ua TRACE (*)    Bacteria, UA FEW (*)    All other components within normal limits  POC OCCULT BLOOD, ED - Abnormal; Notable for the following components:   Fecal Occult Bld POSITIVE (*)    All other components within normal limits  C DIFFICILE QUICK SCREEN W PCR REFLEX  GASTROINTESTINAL PANEL BY PCR, STOOL (REPLACES STOOL CULTURE)  COMPREHENSIVE METABOLIC PANEL  LIPASE, BLOOD  I-STAT BETA HCG BLOOD, ED ([facility], [facility], [facility] ONLY)  TYPE AND SCREEN    EKG None  Radiology No results found.  Procedures Procedures {Remember to document critical care time when appropriate:1}  Medications Ordered in ED Medications  ondansetron (ZOFRAN) injection 4 mg (4 mg Intravenous Given 05/10/20 1755)  lactated ringers bolus 1,500 mL (0 mLs Intravenous Stopped 05/10/20 1859)  ciprofloxacin (CIPRO) tablet 500 mg (500 mg Oral Given 05/10/20 2229)  lactobacillus acidophilus & bulgar (LACTINEX) chewable tablet 1 tablet (1 tablet Oral Given 05/10/20 2229)    ED Course  I have reviewed the triage vital signs and the nursing notes.  Pertinent labs & imaging results that were available during my care of the patient were reviewed by me and considered in my medical decision making (see chart for details).  Clinical Course as of 05/10/20 2249  Wed May 10, 2020  1832 IVF started at 1800. Hemoccult positive. Pending GI panel and C  diff studies. [KH]  2141 Patient hydrated. States she feels better. C diff negative. Repeat abdominal exam improved with minimal TTP. Now tolerating ginger ale.  GI pathogen panel pending; however, given history and bloody diarrhea plan to trial on course of Ciprofloxacin. Will add lactinex, antiemetics for outpatient symptom management. [KH]    Clinical Course User Index [KH] Antony Madura, PA-C   MDM Rules/Calculators/A&P                             Final Clinical Impression(s) / ED Diagnoses Final diagnoses:  Infectious diarrhea in adult patient    Rx / DC Orders ED Discharge Orders         Ordered    ciprofloxacin (CIPRO) 500 MG tablet  2 times daily        05/10/20 2148    lactobacillus acidophilus & bulgar (LACTINEX) chewable tablet  3 times daily with meals        05/10/20 2148    ondansetron (ZOFRAN ODT) 4 MG disintegrating tablet  Every 8 hours PRN        05/10/20 2148

## 2020-05-10 NOTE — Discharge Instructions (Signed)
Take ciprofloxacin as prescribed.  We also recommend the use of Lactinex to help relieve diarrhea.  Take Zofran for management of persistent nausea.  Drink plenty of clear liquids to prevent dehydration.  You may follow-up on the results of your GI pathogen panel through MyChart.  Return to the emergency department for new or concerning symptoms such as severe abdominal pain, fever over 100.4 F, persistent vomiting, worsening bloody diarrhea, loss of consciousness, shortness of breath.

## 2020-05-10 NOTE — ED Triage Notes (Signed)
Emergency Medicine Provider Triage Evaluation Note  Colleen Stone , a 25 y.o. female  was evaluated in triage.  Pt complains of bright red blood per rectum, diarrhea, nausea, NBNB vomiting, upper abdominal pain for the past 2 days after she ate sushi; denies suspicious intake. Was recently on doxy and flagyl however pt unsure why (04/06 prescribed date). Complains of lightheadedness as well. Went to UC and was sent here. Reports history of internal hemorrhoids also.   Review of Systems  Positive: + abdominal pain, nausea, vomiting, BRBPR, diarrhea, lightheadedness Negative: - fevers, chest pain, SOB  Physical Exam  BP (!) 134/108 (BP Location: Right Arm)   Pulse 91   Temp 98.1 F (36.7 C) (Oral)   Resp 16   SpO2 99%  Gen:   Awake, no distress   HEENT:  Atraumatic  Resp:  Normal effort  Cardiac:  Normal rate  Abd:   Nondistended, + diffuse upper abdominal TTP MSK:   Moves extremities without difficulty  Neuro:  Speech clear   Medical Decision Making  Medically screening exam initiated at 9:57 AM.  Appropriate orders placed.  Colleen Stone was informed that the remainder of the evaluation will be completed by another provider, this initial triage assessment does not replace that evaluation, and the importance of remaining in the ED until their evaluation is complete.  Clinical Impression  Complains of bright red blood per rectum,N/V/D, and abdominal pain after eating sushi. VSS. Has diffuse upper abdominal TTP on exam today. Still has gallbladder. Was also on abx recently. Question C diff however pt denies fevers or chills. Will plan for labs and EKG at this time. Rectal exam deferred at this time while in triage. Stable.    Tanda Rockers, PA-C 05/10/20 1000

## 2020-05-10 NOTE — ED Provider Notes (Signed)
[facility] EMERGENCY DEPARTMENT Provider Note   [name]: [id number] Arrival date & time: 05/10/20  0932     History No chief complaint on file.   Colleen Stone is a 25 y.o. female.  25 year old female with a history of hypertension, PCOS presents to the emergency department for evaluation of nausea, vomiting, diarrhea.  Her symptoms began 4 days ago with abdominal pain and diarrhea.  She has developed nausea and vomiting over the past 48 hours.  Describes her diarrhea as watery mixed with blood and mucous; too numerous to count episodes daily.  Usually has a bowel movement shortly after any oral intake.  Also notes associated worsening of her abdominal cramping when she has a bowel movement; however BM does not relieve her pain.  Nausea and vomiting waxing and waning.  She has had ~4 episodes of NB/NB emesis per day.  Has been eating Jell-O for symptoms without relief.  Denies fevers, chest pain, SOB, syncope, hx of abdominal surgeries.  Does have a hx of abx use earlier this month.  No recent travel.  Eats sushi daily for lunch as the restaurant is located near her work.  The history is provided by the patient. No language interpreter was used.       Past Medical History:  Diagnosis Date  . Anxiety   . Asthma   . Chlamydia   . Gonorrhea   . Hypertension    takes labetalol  . MRSA infection    8th grade  . Other acne 06/30/2008   Overview:  Acne  . PCOS (polycystic ovarian syndrome)   . Seizures (HCC)    Last seizure in 2017  . Trichomonas infection     Patient Active Problem List   Diagnosis Date Noted  . Hypertension   . History of seizure disorder 06/15/2018  . Bipolar 1 disorder (HCC) 01/13/2018  . Family history of schizophrenia 01/13/2018  . Smoker 01/13/2018  . Atypical squamous cells of undetermined significance (ASCUS) on Papanicolaou smear of cervix 01/26/2013  . Family history of breast cancer 08/31/2012    Past Surgical History:   Procedure Laterality Date  . ADENOIDECTOMY    . NASAL SEPTOPLASTY W/ TURBINOPLASTY    . ROOT CANAL    . TONSILLECTOMY    . WISDOM TOOTH EXTRACTION       OB History    Gravida  2   Para  1   Term  1   Preterm      AB  1   Living  1     SAB  1   IAB      Ectopic      Multiple  0   Live Births  1           Family History  Problem Relation Age of Onset  . Asthma Mother   . Heart disease Mother        murmur  . Hypertension Mother   . Heart disease Father     Social History   Tobacco Use  . Smoking status: Former Smoker    Types: Cigarettes    Quit date: 10/03/2017    Years since quitting: 2.6  . Smokeless tobacco: Never Used  Vaping Use  . Vaping Use: Never used  Substance Use Topics  . Alcohol use: Not Currently    Comment: Social  . Drug use: Not Currently    Types: Marijuana    Comment: Last marijuana 11/19    Home Medications Prior to Admission  medications   Medication Sig Start Date End Date Taking? Authorizing Provider  ciprofloxacin (CIPRO) 500 MG tablet Take 1 tablet (500 mg total) by mouth 2 (two) times daily for 5 days. 05/10/20 05/15/20 Yes Antony Madura, PA-C  lactobacillus acidophilus & bulgar (LACTINEX) chewable tablet Chew 1 tablet by mouth 3 (three) times daily with meals for 15 days. 05/10/20 05/25/20 Yes Antony Madura, PA-C  ondansetron (ZOFRAN ODT) 4 MG disintegrating tablet Take 1 tablet (4 mg total) by mouth every 8 (eight) hours as needed for nausea or vomiting. 05/10/20  Yes Antony Madura, PA-C  albuterol (PROVENTIL) (2.5 MG/3ML) 0.083% nebulizer solution Take 3 mLs (2.5 mg total) by nebulization every 6 (six) hours as needed for wheezing or shortness of breath. 08/05/18   Fulp, Cammie, MD  albuterol (VENTOLIN HFA) 108 (90 Base) MCG/ACT inhaler Inhale 2 puffs into the lungs every 6 (six) hours as needed for wheezing or shortness of breath. 08/05/18   Fulp, Cammie, MD  loratadine (CLARITIN) 10 MG tablet Take 1 tablet (10 mg total) by  mouth daily. 04/18/20   Brock Bad, MD  medroxyPROGESTERone (PROVERA) 10 MG tablet Take 2 tablets (20 mg total) by mouth daily. 12/01/19   Adam Phenix, MD  cetirizine (ZYRTEC ALLERGY) 10 MG tablet Take 1 tablet (10 mg total) by mouth 2 (two) times daily. 06/21/19 06/25/19  Muthersbaugh, Dahlia Client, PA-C  famotidine (PEPCID) 20 MG tablet Take 1 tablet (20 mg total) by mouth 2 (two) times daily. 06/21/19 06/25/19  Muthersbaugh, Dahlia Client, PA-C  labetalol (NORMODYNE) 200 MG tablet Take 1 tablet (200 mg total) by mouth 2 (two) times daily. 08/05/18 06/25/19  Fulp, Hewitt Shorts, MD    Allergies    Red dye and Latex  Review of Systems   Review of Systems  Ten systems reviewed and are negative for acute change, except as noted in the HPI.    Physical Exam Updated Vital Signs BP 97/85   Pulse 77   Temp 98.4 F (36.9 C) (Oral)   Resp 15   SpO2 99%   Physical Exam Vitals and nursing note reviewed.  Constitutional:      General: She is not in acute distress.    Appearance: She is well-developed. She is not diaphoretic.     Comments: Nontoxic appearing and in NAD  HENT:     Head: Normocephalic and atraumatic.  Eyes:     General: No scleral icterus.    Conjunctiva/sclera: Conjunctivae normal.  Cardiovascular:     Rate and Rhythm: Normal rate and regular rhythm.     Pulses: Normal pulses.  Pulmonary:     Effort: Pulmonary effort is normal. No respiratory distress.     Comments: Respirations even and unlabored Abdominal:     Palpations: Abdomen is soft.     Tenderness: There is abdominal tenderness. There is no guarding.     Comments: TTP in the RUQ, epigastrium, RLQ. Abdomen soft, obese. No peritoneal signs, palpable masses, guarding.  Musculoskeletal:        General: Normal range of motion.     Cervical back: Normal range of motion.  Skin:    General: Skin is warm and dry.     Coloration: Skin is not pale.     Findings: No erythema or rash.  Neurological:     Mental Status: She is alert  and oriented to person, place, and time.  Psychiatric:        Behavior: Behavior normal.     ED Results / Procedures / Treatments  Labs (all labs ordered are listed, but only abnormal results are displayed) Labs Reviewed  CBC WITH DIFFERENTIAL/PLATELET - Abnormal; Notable for the following components:      Result Value   RBC 5.37 (*)    MCV 79.5 (*)    MCH 25.1 (*)    RDW 16.7 (*)    All other components within normal limits  URINALYSIS, ROUTINE W REFLEX MICROSCOPIC - Abnormal; Notable for the following components:   APPearance HAZY (*)    Ketones, ur 20 (*)    Leukocytes,Ua TRACE (*)    Bacteria, UA FEW (*)    All other components within normal limits  POC OCCULT BLOOD, ED - Abnormal; Notable for the following components:   Fecal Occult Bld POSITIVE (*)    All other components within normal limits  C DIFFICILE QUICK SCREEN W PCR REFLEX  GASTROINTESTINAL PANEL BY PCR, STOOL (REPLACES STOOL CULTURE)  COMPREHENSIVE METABOLIC PANEL  LIPASE, BLOOD  I-STAT BETA HCG BLOOD, ED ([facility], [facility], [facility] ONLY)  TYPE AND SCREEN    EKG None  Radiology No results found.  Procedures Procedures   Medications Ordered in ED Medications  ondansetron (ZOFRAN) injection 4 mg (4 mg Intravenous Given 05/10/20 1755)  lactated ringers bolus 1,500 mL (0 mLs Intravenous Stopped 05/10/20 1859)  ciprofloxacin (CIPRO) tablet 500 mg (500 mg Oral Given 05/10/20 2229)  lactobacillus acidophilus & bulgar (LACTINEX) chewable tablet 1 tablet (1 tablet Oral Given 05/10/20 2229)    ED Course  I have reviewed the triage vital signs and the nursing notes.  Pertinent labs & imaging results that were available during my care of the patient were reviewed by me and considered in my medical decision making (see chart for details).  Clinical Course as of 05/10/20 2305  Wed May 10, 2020  1832 IVF started at 1800. Hemoccult positive. Pending GI panel and C diff studies. [KH]  2141 Patient hydrated. States she feels  better. C diff negative. Repeat abdominal exam improved with minimal TTP. Now tolerating ginger ale.  GI pathogen panel pending; however, given history and bloody diarrhea plan to trial on course of Ciprofloxacin. Will add lactinex, antiemetics for outpatient symptom management. [KH]    Clinical Course User Index [KH] Darylene Price   MDM Rules/Calculators/A&P                          25 year old young, otherwise healthy female presents to the emergency department for evaluation of bloody diarrhea, abdominal pain, nausea, vomiting.  She eats sushi for lunch every day and thinks this may have caused her symptom onset.  Patient well-appearing, overall; hemodynamically stable and afebrile.  Labs initiated in triage.  She has no leukocytosis or significant electrolyte derangement.  Liver and kidney function preserved.  Her pregnancy test is negative.  Hydrated in the emergency department with IV fluids.  Also given antiemetics.  Repeat abdominal exam is improved with minimal tenderness.  No peritoneal signs.  C. difficile testing is negative.  GI pathogen panel pending.  Given 4 days of symptoms with bloody diarrhea, will start on ciprofloxacin 500 mg twice daily.  Also given prescription for Lactinex and antiemetics.  Encouraged oral fluid hydration.  Tolerating ginger ale in the ED.  Patient to follow-up with a primary care doctor.  Return precautions discussed and provided. Patient discharged in stable condition with no unaddressed concerns.   Final Clinical Impression(s) / ED Diagnoses Final diagnoses:  Infectious diarrhea in adult patient  Rx / DC Orders ED Discharge Orders         Ordered    ciprofloxacin (CIPRO) 500 MG tablet  2 times daily        05/10/20 2148    lactobacillus acidophilus & bulgar (LACTINEX) chewable tablet  3 times daily with meals        05/10/20 2148    ondansetron (ZOFRAN ODT) 4 MG disintegrating tablet  Every 8 hours PRN        05/10/20 2148            Antony MaduraHumes, Charmain Diosdado, Cordelia Poche-C 05/10/20 2308    Eber HongMiller, Brian, MD 05/20/20 1436

## 2020-05-10 NOTE — ED Notes (Signed)
All appropriate discharge materials reviewed at length with patient. Time for questions provided. Pt has no other questions at this time and verbalizes understanding of all provided materials.  

## 2020-05-10 NOTE — ED Triage Notes (Signed)
Pt here from home with c/o abd pain and diarrhea with blood in her stool , weak from vomiting

## 2020-05-12 LAB — GASTROINTESTINAL PANEL BY PCR, STOOL (REPLACES STOOL CULTURE)

## 2020-05-19 ENCOUNTER — Encounter: Payer: Self-pay | Admitting: Emergency Medicine

## 2020-05-19 ENCOUNTER — Ambulatory Visit
Admission: EM | Admit: 2020-05-19 | Discharge: 2020-05-19 | Disposition: A | Discharge status: Home / Self Care | Payer: Medicaid Other | Attending: Internal Medicine | Admitting: Internal Medicine

## 2020-05-19 ENCOUNTER — Other Ambulatory Visit: Payer: Self-pay

## 2020-05-19 DIAGNOSIS — N760 Acute vaginitis: Secondary | ICD-10-CM

## 2020-05-19 NOTE — ED Provider Notes (Signed)
[name]    [name]: [id number] Arrival date & time: 05/19/20  1410      History   Chief Complaint Chief Complaint  Patient presents with  . Exposure to STD    HPI Colleen Stone is a 25 y.o. female comes to the urgent care with complaints of vaginal irritation.  This started a few days ago after she changed her bathing soap.   No dysuria, urgency or frequency.  No deep or superficial dyspareunia.  No flank pain nausea or vomiting.  HPI  Past Medical History:  Diagnosis Date  . Anxiety   . Asthma   . Chlamydia   . Gonorrhea   . Hypertension    takes labetalol  . MRSA infection    8th grade  . Other acne 06/30/2008   Overview:  Acne  . PCOS (polycystic ovarian syndrome)   . Seizures (HCC)    Last seizure in 2017  . Trichomonas infection     Patient Active Problem List   Diagnosis Date Noted  . Hypertension   . History of seizure disorder 06/15/2018  . Bipolar 1 disorder (HCC) 01/13/2018  . Family history of schizophrenia 01/13/2018  . Smoker 01/13/2018  . Atypical squamous cells of undetermined significance (ASCUS) on Papanicolaou smear of cervix 01/26/2013  . Family history of breast cancer 08/31/2012    Past Surgical History:  Procedure Laterality Date  . ADENOIDECTOMY    . NASAL SEPTOPLASTY W/ TURBINOPLASTY    . ROOT CANAL    . TONSILLECTOMY    . WISDOM TOOTH EXTRACTION      OB History    Gravida  2   Para  1   Term  1   Preterm      AB  1   Living  1     SAB  1   IAB      Ectopic      Multiple  0   Live Births  1            Home Medications    Prior to Admission medications   Medication Sig Start Date End Date Taking? Authorizing Provider  albuterol (PROVENTIL) (2.5 MG/3ML) 0.083% nebulizer solution Take 3 mLs (2.5 mg total) by nebulization every 6 (six) hours as needed for wheezing or shortness of breath. 08/05/18   Fulp, Cammie, MD  albuterol (VENTOLIN HFA) 108 (90 Base) MCG/ACT inhaler Inhale 2 puffs into  the lungs every 6 (six) hours as needed for wheezing or shortness of breath. 08/05/18   Fulp, Cammie, MD  lactobacillus acidophilus & bulgar (LACTINEX) chewable tablet Chew 1 tablet by mouth 3 (three) times daily with meals for 15 days. 05/10/20 05/25/20  Antony Madura, PA-C  loratadine (CLARITIN) 10 MG tablet Take 1 tablet (10 mg total) by mouth daily. 04/18/20   Brock Bad, MD  medroxyPROGESTERone (PROVERA) 10 MG tablet Take 2 tablets (20 mg total) by mouth daily. 12/01/19   Adam Phenix, MD  ondansetron (ZOFRAN ODT) 4 MG disintegrating tablet Take 1 tablet (4 mg total) by mouth every 8 (eight) hours as needed for nausea or vomiting. 05/10/20   Antony Madura, PA-C  cetirizine (ZYRTEC ALLERGY) 10 MG tablet Take 1 tablet (10 mg total) by mouth 2 (two) times daily. 06/21/19 06/25/19  Muthersbaugh, Dahlia Client, PA-C  famotidine (PEPCID) 20 MG tablet Take 1 tablet (20 mg total) by mouth 2 (two) times daily. 06/21/19 06/25/19  Muthersbaugh, Dahlia Client, PA-C  labetalol (NORMODYNE) 200 MG tablet Take 1 tablet (200 mg  total) by mouth 2 (two) times daily. 08/05/18 06/25/19  Cain Saupe, MD    Family History Family History  Problem Relation Age of Onset  . Asthma Mother   . Heart disease Mother        murmur  . Hypertension Mother   . Heart disease Father     Social History Social History   Tobacco Use  . Smoking status: Former Smoker    Types: Cigarettes    Quit date: 10/03/2017    Years since quitting: 2.6  . Smokeless tobacco: Never Used  Vaping Use  . Vaping Use: Never used  Substance Use Topics  . Alcohol use: Not Currently    Comment: Social  . Drug use: Not Currently    Types: Marijuana    Comment: Last marijuana 11/19     Allergies   Red dye and Latex   Review of Systems Review of Systems  Gastrointestinal: Negative for abdominal pain.  Genitourinary: Negative for dysuria, frequency and urgency.  Musculoskeletal: Negative.      Physical Exam Triage Vital Signs ED Triage Vitals  [05/19/20 1609]  Enc Vitals Group     BP (!) 156/82     Pulse Rate 82     Resp 18     Temp 98.2 F (36.8 C)     Temp Source Oral     SpO2 98 %     Weight      Height      Head Circumference      Peak Flow      Pain Score 0     Pain Loc      Pain Edu?      Excl. in GC?    No data found.  Updated Vital Signs BP (!) 156/82 (BP Location: Left Arm)   Pulse 82   Temp 98.2 F (36.8 C) (Oral)   Resp 18   SpO2 98%   Visual Acuity Right Eye Distance:   Left Eye Distance:   Bilateral Distance:    Right Eye Near:   Left Eye Near:    Bilateral Near:     Physical Exam Vitals and nursing note reviewed.  Constitutional:      General: She is not in acute distress.    Appearance: She is not ill-appearing.  Cardiovascular:     Rate and Rhythm: Normal rate and regular rhythm.  Neurological:     Mental Status: She is alert.      UC Treatments / Results  Labs (all labs ordered are listed, but only abnormal results are displayed) Labs Reviewed  CERVICOVAGINAL ANCILLARY ONLY    EKG   Radiology No results found.  Procedures Procedures (including critical care time)  Medications Ordered in UC Medications - No data to display  Initial Impression / Assessment and Plan / UC Course  I have reviewed the triage vital signs and the nursing notes.  Pertinent labs & imaging results that were available during my care of the patient were reviewed by me and considered in my medical decision making (see chart for details).     1.  Acute vaginitis: Cervicaovaginal swab for GC/chlamydia/trichomonas- per patient's request Cervicovaginal swab for bacterial vaginosis/vaginal yeast infection Patient is advised to change her soap We will call patient with recommendations if labs are abnormal.    Final Clinical Impressions(s) / UC Diagnoses   Final diagnoses:  Acute vaginitis     Discharge Instructions     Please change his soap to the gentle soap We  will call you with  recommendations if labs are abnormal Safe sex practices advised.   ED Prescriptions    None     PDMP not reviewed this encounter.   Merrilee Jansky, MD 05/19/20 [phone]

## 2020-05-19 NOTE — Discharge Instructions (Signed)
Please change his soap to the gentle soap We will call you with recommendations if labs are abnormal Safe sex practices advised.

## 2020-05-19 NOTE — ED Triage Notes (Signed)
Pt here for STD screening sts had recent BV and feels same

## 2020-05-22 ENCOUNTER — Telehealth (HOSPITAL_COMMUNITY): Payer: Self-pay | Admitting: Emergency Medicine

## 2020-05-22 LAB — CERVICOVAGINAL ANCILLARY ONLY
Bacterial Vaginitis (gardnerella): NEGATIVE
Candida Glabrata: NEGATIVE
Candida Vaginitis: POSITIVE — AB
Chlamydia: NEGATIVE
Comment: NEGATIVE
Comment: NEGATIVE
Comment: NEGATIVE
Comment: NEGATIVE
Comment: NEGATIVE
Comment: NORMAL
Neisseria Gonorrhea: NEGATIVE
Trichomonas: NEGATIVE

## 2020-05-22 MED ORDER — TERCONAZOLE 0.4 % VA CREA: 1.0000 | TOPICAL_CREAM | Freq: Every day | VAGINAL | 0 refills | Status: AC

## 2020-06-08 ENCOUNTER — Emergency Department (HOSPITAL_COMMUNITY): Payer: Medicaid Other

## 2020-06-08 ENCOUNTER — Encounter (HOSPITAL_COMMUNITY): Payer: Self-pay | Admitting: Emergency Medicine

## 2020-06-08 ENCOUNTER — Emergency Department (HOSPITAL_COMMUNITY)
Admission: EM | Admit: 2020-06-08 | Discharge: 2020-06-08 | Disposition: A | Discharge status: Home / Self Care | Payer: Medicaid Other | Attending: Emergency Medicine | Admitting: Emergency Medicine

## 2020-06-08 DIAGNOSIS — R112 Nausea with vomiting, unspecified: Secondary | ICD-10-CM | POA: Insufficient documentation

## 2020-06-08 DIAGNOSIS — I10 Essential (primary) hypertension: Secondary | ICD-10-CM | POA: Diagnosis not present

## 2020-06-08 DIAGNOSIS — Z91040 Latex allergy status: Secondary | ICD-10-CM | POA: Diagnosis not present

## 2020-06-08 DIAGNOSIS — R1010 Upper abdominal pain, unspecified: Secondary | ICD-10-CM

## 2020-06-08 DIAGNOSIS — R1013 Epigastric pain: Secondary | ICD-10-CM

## 2020-06-08 DIAGNOSIS — J45909 Unspecified asthma, uncomplicated: Secondary | ICD-10-CM | POA: Insufficient documentation

## 2020-06-08 DIAGNOSIS — Z20822 Contact with and (suspected) exposure to covid-19: Secondary | ICD-10-CM | POA: Insufficient documentation

## 2020-06-08 DIAGNOSIS — Z87891 Personal history of nicotine dependence: Secondary | ICD-10-CM | POA: Insufficient documentation

## 2020-06-08 DIAGNOSIS — Z79899 Other long term (current) drug therapy: Secondary | ICD-10-CM | POA: Insufficient documentation

## 2020-06-08 DIAGNOSIS — K2901 Acute gastritis with bleeding: Secondary | ICD-10-CM

## 2020-06-08 LAB — COMPREHENSIVE METABOLIC PANEL
ALT: 15 U/L (ref 0–44)
AST: 16 U/L (ref 15–41)
Albumin: 3.4 g/dL — ABNORMAL LOW (ref 3.5–5.0)
Alkaline Phosphatase: 90 U/L (ref 38–126)
Anion gap: 9 (ref 5–15)
BUN: 8 mg/dL (ref 6–20)
CO2: 23 mmol/L (ref 22–32)
Calcium: 8.9 mg/dL (ref 8.9–10.3)
Chloride: 104 mmol/L (ref 98–111)
Creatinine, Ser: 0.65 mg/dL (ref 0.44–1.00)
GFR, Estimated: >60 mL/min (ref >60)
Glucose, Bld: 99 mg/dL (ref 70–99)
Potassium: 3.7 mmol/L (ref 3.5–5.1)
Sodium: 136 mmol/L (ref 135–145)
Total Bilirubin: 0.7 mg/dL (ref 0.3–1.2)
Total Protein: 7 g/dL (ref 6.5–8.1)

## 2020-06-08 LAB — RESP PANEL BY RT-PCR (FLU A&B, COVID) ARPGX2
Influenza A by PCR: NEGATIVE
Influenza B by PCR: NEGATIVE
SARS Coronavirus 2 by RT PCR: NEGATIVE

## 2020-06-08 LAB — CBC
HCT: 39.9 % (ref 36.0–46.0)
Hemoglobin: 12.6 g/dL (ref 12.0–15.0)
MCH: 24.7 pg — ABNORMAL LOW (ref 26.0–34.0)
MCHC: 31.6 g/dL (ref 30.0–36.0)
MCV: 78.2 fL — ABNORMAL LOW (ref 80.0–100.0)
Platelets: 366 10*3/uL (ref 150–400)
RBC: 5.1 MIL/uL (ref 3.87–5.11)
RDW: 15.9 % — ABNORMAL HIGH (ref 11.5–15.5)
WBC: 7.4 10*3/uL (ref 4.0–10.5)
nRBC: 0 % (ref 0.0–0.2)

## 2020-06-08 LAB — I-STAT BETA HCG BLOOD, ED (MC, WL, AP ONLY): I-stat hCG, quantitative: <5 m[IU]/mL (ref <5)

## 2020-06-08 LAB — LIPASE, BLOOD: Lipase: 22 U/L (ref 11–51)

## 2020-06-08 MED ORDER — ONDANSETRON HCL 4 MG/2ML IJ SOLN
4.0000 mg | Freq: Once | INTRAMUSCULAR | Status: AC
Start: 2020-06-08 — End: 2020-06-08
  Administered 2020-06-08: 4 mg via INTRAVENOUS
  Filled 2020-06-08: qty 2

## 2020-06-08 MED ORDER — ALUM & MAG HYDROXIDE-SIMETH 200-200-20 MG/5ML PO SUSP
30.0000 mL | Freq: Once | ORAL | Status: AC
  Administered 2020-06-08: 30 mL via ORAL
  Filled 2020-06-08: qty 30

## 2020-06-08 MED ORDER — SODIUM CHLORIDE 0.9 % IV BOLUS
1000.0000 mL | Freq: Once | INTRAVENOUS | Status: AC
  Administered 2020-06-08: 1000 mL via INTRAVENOUS

## 2020-06-08 MED ORDER — PROCHLORPERAZINE EDISYLATE 10 MG/2ML IJ SOLN
5.0000 mg | Freq: Once | INTRAMUSCULAR | Status: AC
  Administered 2020-06-08: 5 mg via INTRAVENOUS
  Filled 2020-06-08: qty 2

## 2020-06-08 MED ORDER — ONDANSETRON 4 MG PO TBDP: 4.0000 mg | ORAL_TABLET | Freq: Three times a day (TID) | ORAL | 0 refills | Status: DC | PRN

## 2020-06-08 MED ORDER — PANTOPRAZOLE SODIUM 20 MG PO TBEC: 20.0000 mg | DELAYED_RELEASE_TABLET | Freq: Every day | ORAL | 0 refills | Status: DC

## 2020-06-08 NOTE — Discharge Instructions (Signed)
Try and keep yourself hydrated.  The medicines may help with the pain.  Follow-up with GI since you have been throwing up some blood.

## 2020-06-08 NOTE — ED Triage Notes (Signed)
Pt reports upper abdominal pain off and on for the last 3 days. Reports multiple episodes of vomiting. Noted to have blood. Reports acid indigestion as well. Denies recent fevers. States unable to keep fluids down currently.

## 2020-06-08 NOTE — ED Provider Notes (Signed)
[facility] EMERGENCY DEPARTMENT Provider Note   [name]: [id number] Arrival date & time: 06/08/20  5027     History Chief Complaint  Patient presents with  . Abdominal Pain    Colleen Stone is a 25 y.o. female.  HPI Patient presents with abdominal pain.  Upper abdomen.  States she has had for the last 3 days.  States she had similar symptoms previously.  States only vomiting now.  No diarrhea.  States that she has vomited up a little bit of blood.  States she been seen by this previously but they did not do any imaging.  States they gave antibiotics.  Denies vaginal bleeding or discharge.  States pain is in the upper abdomen.  No known sick contacts.  Patient hopes she is not pregnant.  No lightheadedness or dizziness.  States she is not hungry because every time she tries to eat the pain gets worse and she throws up.    Past Medical History:  Diagnosis Date  . Anxiety   . Asthma   . Chlamydia   . Gonorrhea   . Hypertension    takes labetalol  . MRSA infection    8th grade  . Other acne 06/30/2008   Overview:  Acne  . PCOS (polycystic ovarian syndrome)   . Seizures (HCC)    Last seizure in 2017  . Trichomonas infection     Patient Active Problem List   Diagnosis Date Noted  . Hypertension   . History of seizure disorder 06/15/2018  . Bipolar 1 disorder (HCC) 01/13/2018  . Family history of schizophrenia 01/13/2018  . Smoker 01/13/2018  . Atypical squamous cells of undetermined significance (ASCUS) on Papanicolaou smear of cervix 01/26/2013  . Family history of breast cancer 08/31/2012    Past Surgical History:  Procedure Laterality Date  . ADENOIDECTOMY    . NASAL SEPTOPLASTY W/ TURBINOPLASTY    . ROOT CANAL    . TONSILLECTOMY    . WISDOM TOOTH EXTRACTION       OB History    Gravida  2   Para  1   Term  1   Preterm      AB  1   Living  1     SAB  1   IAB      Ectopic      Multiple  0   Live Births  1            Family History  Problem Relation Age of Onset  . Asthma Mother   . Heart disease Mother        murmur  . Hypertension Mother   . Heart disease Father     Social History   Tobacco Use  . Smoking status: Former Smoker    Types: Cigarettes    Quit date: 10/03/2017    Years since quitting: 2.6  . Smokeless tobacco: Never Used  Vaping Use  . Vaping Use: Never used  Substance Use Topics  . Alcohol use: Not Currently    Comment: Social  . Drug use: Not Currently    Types: Marijuana    Comment: Last marijuana 11/19    Home Medications Prior to Admission medications   Medication Sig Start Date End Date Taking? Authorizing Provider  albuterol (PROVENTIL) (2.5 MG/3ML) 0.083% nebulizer solution Take 3 mLs (2.5 mg total) by nebulization every 6 (six) hours as needed for wheezing or shortness of breath. 08/05/18   Fulp, Cammie, MD  albuterol (VENTOLIN HFA) 108 (90  Base) MCG/ACT inhaler Inhale 2 puffs into the lungs every 6 (six) hours as needed for wheezing or shortness of breath. 08/05/18   Fulp, Cammie, MD  loratadine (CLARITIN) 10 MG tablet Take 1 tablet (10 mg total) by mouth daily. 04/18/20   Brock Bad, MD  medroxyPROGESTERone (PROVERA) 10 MG tablet Take 2 tablets (20 mg total) by mouth daily. 12/01/19   Adam Phenix, MD  ondansetron (ZOFRAN ODT) 4 MG disintegrating tablet Take 1 tablet (4 mg total) by mouth every 8 (eight) hours as needed for nausea or vomiting. 05/10/20   Antony Madura, PA-C  cetirizine (ZYRTEC ALLERGY) 10 MG tablet Take 1 tablet (10 mg total) by mouth 2 (two) times daily. 06/21/19 06/25/19  Muthersbaugh, Dahlia Client, PA-C  famotidine (PEPCID) 20 MG tablet Take 1 tablet (20 mg total) by mouth 2 (two) times daily. 06/21/19 06/25/19  Muthersbaugh, Dahlia Client, PA-C  labetalol (NORMODYNE) 200 MG tablet Take 1 tablet (200 mg total) by mouth 2 (two) times daily. 08/05/18 06/25/19  Fulp, Cammie, MD    Allergies    Red dye and Latex  Review of Systems   Review of Systems   Constitutional: Positive for appetite change.  HENT: Negative for congestion.   Respiratory: Negative for shortness of breath.   Cardiovascular: Negative for leg swelling.  Gastrointestinal: Positive for abdominal pain, nausea and vomiting. Negative for diarrhea.  Genitourinary: Negative for difficulty urinating and flank pain.  Musculoskeletal: Negative for back pain.  Skin: Negative for rash.  Neurological: Negative for weakness.  Psychiatric/Behavioral: Negative for confusion.    Physical Exam Updated Vital Signs BP (!) 131/97   Pulse 96   Temp 98.3 F (36.8 C) (Oral)   Resp 18   LMP 04/08/2020   SpO2 100%   Physical Exam Vitals reviewed.  Constitutional:      Appearance: She is well-developed. She is obese.     Comments: Patient appears somewhat uncomfortable  HENT:     Head: Normocephalic.  Eyes:     Pupils: Pupils are equal, round, and reactive to light.  Cardiovascular:     Rate and Rhythm: Normal rate and regular rhythm.  Abdominal:     Hernia: No hernia is present.     Comments: Epigastric tenderness without rebound or guarding.  No hernia palpated.  Skin:    General: Skin is warm.     Capillary Refill: Capillary refill takes less than 2 seconds.  Neurological:     Mental Status: She is alert and oriented to person, place, and time.  Psychiatric:        Mood and Affect: Mood normal.     ED Results / Procedures / Treatments   Labs (all labs ordered are listed, but only abnormal results are displayed) Labs Reviewed  CBC - Abnormal; Notable for the following components:      Result Value   MCV 78.2 (*)    MCH 24.7 (*)    RDW 15.9 (*)    All other components within normal limits  RESP PANEL BY RT-PCR (FLU A&B, COVID) ARPGX2  LIPASE, BLOOD  COMPREHENSIVE METABOLIC PANEL  URINALYSIS, ROUTINE W REFLEX MICROSCOPIC  I-STAT BETA HCG BLOOD, ED ([facility], [facility], [facility] ONLY)    EKG None  Radiology US Abdomen Limited RUQ (LIVER/GB)  Result Date:  06/08/2020 CLINICAL DATA:  Epigastric pain EXAM: ULTRASOUND ABDOMEN LIMITED RIGHT UPPER QUADRANT COMPARISON:  None. FINDINGS: Gallbladder: No gallstones or wall thickening visualized. No sonographic Murphy sign noted by sonographer. Common bile duct: Limited visualization.  3  mm diameter. Liver: No focal lesion identified. Within normal limits in parenchymal echogenicity. Portal vein is patent on color Doppler imaging with normal direction of blood flow towards the liver. IMPRESSION: Normal right upper quadrant ultrasound. Electronically Signed   By: Marnee Spring M.D.   On: 06/08/2020 09:45    Procedures Procedures   Medications Ordered in ED Medications  ondansetron (ZOFRAN) injection 4 mg (4 mg Intravenous Given 06/08/20 0929)  sodium chloride 0.9 % bolus 1,000 mL (1,000 mLs Intravenous New Bag/Given 06/08/20 1478)    ED Course  I have reviewed the triage vital signs and the nursing notes.  Pertinent labs & imaging results that were available during my care of the patient were reviewed by me and considered in my medical decision making (see chart for details).    MDM Rules/Calculators/A&P                          Patient with abdominal pain.  Upper abdomen.  States she is had previous episodes of this.  States she has vomited for last 3 days.  States she does even vomit up a little blood.  No black stools.  No diarrhea.  Pain is in the upper abdomen.  States her abdomen feels little more swollen to.  Hopes she is not pregnant.  Patient is not pregnant.  Ultrasound done and was reassuring.  Lab work overall reassuring also.  No anemia.  CT scan done due to continued pain and also did not show clear cause.  Will discharge with proton pump inhibitor and nausea medicines.  Follow-up with GI as needed since had been vomiting some blood although likely gastritis/Mallory-Weiss. Final Clinical Impression(s) / ED Diagnoses Final diagnoses:  Epigastric pain    Rx / DC Orders ED Discharge Orders     None       Benjiman Core, MD 06/08/20 [phone]

## 2020-06-29 ENCOUNTER — Encounter: Payer: Self-pay | Admitting: Emergency Medicine

## 2020-06-29 ENCOUNTER — Other Ambulatory Visit: Payer: Self-pay

## 2020-06-29 ENCOUNTER — Ambulatory Visit
Admission: EM | Admit: 2020-06-29 | Discharge: 2020-06-29 | Disposition: A | Discharge status: Home / Self Care | Payer: Medicaid Other | Attending: Emergency Medicine | Admitting: Emergency Medicine

## 2020-06-29 DIAGNOSIS — N898 Other specified noninflammatory disorders of vagina: Secondary | ICD-10-CM | POA: Insufficient documentation

## 2020-06-29 MED ORDER — METRONIDAZOLE 500 MG PO TABS: 500.0000 mg | ORAL_TABLET | Freq: Two times a day (BID) | ORAL | 0 refills | Status: AC

## 2020-06-29 NOTE — Discharge Instructions (Addendum)
Begin flagyl twice daily with food x 1 week; no alcohol until 24 hours after last tablet  We are testing you for Gonorrhea, Chlamydia, Trichomonas, Yeast and Bacterial Vaginosis. We will call you if anything is positive and let you know if you require any further treatment. Please inform partners of any positive results.   Please return if symptoms not improving with treatment, development of fever, nausea, vomiting, abdominal pain.

## 2020-06-29 NOTE — ED Triage Notes (Signed)
Pt here for vaginal discharge that she thinks is either yeast or BV with hx of same; denies dysuria

## 2020-06-29 NOTE — ED Provider Notes (Signed)
[name]    [name]: [id number] Arrival date & time: 06/29/20  1150      History   Chief Complaint Chief Complaint  Patient presents with   Vaginal Discharge    HPI Colleen Stone is a 25 y.o. female history of hypertension, PCOS, presenting today for evaluation of vaginal discharge.  Reports symptoms feel similar to prior yeast/BV.  Denies any dysuria.  Associated irritation.  Feels more similar to prior BV rather than yeast.  Denies urinary symptoms.  Ended menstrual cycle 3 days ago  HPI  Past Medical History:  Diagnosis Date   Anxiety    Asthma    Chlamydia    Gonorrhea    Hypertension    takes labetalol   MRSA infection    8th grade   Other acne 06/30/2008   Overview:  Acne   PCOS (polycystic ovarian syndrome)    Seizures (HCC)    Last seizure in 2017   Trichomonas infection     Patient Active Problem List   Diagnosis Date Noted   Hypertension    History of seizure disorder 06/15/2018   Bipolar 1 disorder (HCC) 01/13/2018   Family history of schizophrenia 01/13/2018   Smoker 01/13/2018   Atypical squamous cells of undetermined significance (ASCUS) on Papanicolaou smear of cervix 01/26/2013   Family history of breast cancer 08/31/2012    Past Surgical History:  Procedure Laterality Date   ADENOIDECTOMY     NASAL SEPTOPLASTY W/ TURBINOPLASTY     ROOT CANAL     TONSILLECTOMY     WISDOM TOOTH EXTRACTION      OB History     Gravida  2   Para  1   Term  1   Preterm      AB  1   Living  1      SAB  1   IAB      Ectopic      Multiple  0   Live Births  1            Home Medications    Prior to Admission medications   Medication Sig Start Date End Date Taking? Authorizing Provider  metroNIDAZOLE (FLAGYL) 500 MG tablet Take 1 tablet (500 mg total) by mouth 2 (two) times daily for 7 days. 06/29/20 07/06/20 Yes Dvonte Gatliff C, PA-C  albuterol (PROVENTIL) (2.5 MG/3ML) 0.083% nebulizer solution Take 3 mLs (2.5 mg  total) by nebulization every 6 (six) hours as needed for wheezing or shortness of breath. 08/05/18   Fulp, Cammie, MD  albuterol (VENTOLIN HFA) 108 (90 Base) MCG/ACT inhaler Inhale 2 puffs into the lungs every 6 (six) hours as needed for wheezing or shortness of breath. 08/05/18   Fulp, Cammie, MD  loratadine (CLARITIN) 10 MG tablet Take 1 tablet (10 mg total) by mouth daily. 04/18/20   Brock Bad, MD  medroxyPROGESTERone (PROVERA) 10 MG tablet Take 2 tablets (20 mg total) by mouth daily. 12/01/19   Adam Phenix, MD  ondansetron (ZOFRAN-ODT) 4 MG disintegrating tablet Take 1 tablet (4 mg total) by mouth every 8 (eight) hours as needed for nausea or vomiting. 06/08/20   Benjiman Core, MD  pantoprazole (PROTONIX) 20 MG tablet Take 1 tablet (20 mg total) by mouth daily. 06/08/20   Benjiman Core, MD  cetirizine (ZYRTEC ALLERGY) 10 MG tablet Take 1 tablet (10 mg total) by mouth 2 (two) times daily. 06/21/19 06/25/19  Muthersbaugh, Dahlia Client, PA-C  famotidine (PEPCID) 20 MG tablet Take 1 tablet (20  mg total) by mouth 2 (two) times daily. 06/21/19 06/25/19  Muthersbaugh, Dahlia Client, PA-C  labetalol (NORMODYNE) 200 MG tablet Take 1 tablet (200 mg total) by mouth 2 (two) times daily. 08/05/18 06/25/19  Cain Saupe, MD    Family History Family History  Problem Relation Age of Onset   Asthma Mother    Heart disease Mother        murmur   Hypertension Mother    Heart disease Father     Social History Social History   Tobacco Use   Smoking status: Former    Pack years: 0.00    Types: Cigarettes    Quit date: 10/03/2017    Years since quitting: 2.7   Smokeless tobacco: Never  Vaping Use   Vaping Use: Never used  Substance Use Topics   Alcohol use: Not Currently    Comment: Social   Drug use: Not Currently    Types: Marijuana    Comment: Last marijuana 11/19     Allergies   Red dye and Latex   Review of Systems Review of Systems  Constitutional:  Negative for fever.  Respiratory:   Negative for shortness of breath.   Cardiovascular:  Negative for chest pain.  Gastrointestinal:  Negative for abdominal pain, diarrhea, nausea and vomiting.  Genitourinary:  Positive for vaginal discharge. Negative for dysuria, flank pain, genital sores, hematuria, menstrual problem, vaginal bleeding and vaginal pain.  Musculoskeletal:  Negative for back pain.  Skin:  Negative for rash.  Neurological:  Negative for dizziness, light-headedness and headaches.    Physical Exam Triage Vital Signs ED Triage Vitals [06/29/20 1206]  Enc Vitals Group     BP (!) 132/91     Pulse Rate 95     Resp 18     Temp 98 F (36.7 C)     Temp Source Oral     SpO2 96 %     Weight      Height      Head Circumference      Peak Flow      Pain Score 0     Pain Loc      Pain Edu?      Excl. in GC?    No data found.  Updated Vital Signs BP (!) 132/91 (BP Location: Right Arm)   Pulse 95   Temp 98 F (36.7 C) (Oral)   Resp 18   SpO2 96%   Visual Acuity Right Eye Distance:   Left Eye Distance:   Bilateral Distance:    Right Eye Near:   Left Eye Near:    Bilateral Near:     Physical Exam Vitals and nursing note reviewed.  Constitutional:      Appearance: She is well-developed.     Comments: No acute distress  HENT:     Head: Normocephalic and atraumatic.     Nose: Nose normal.  Eyes:     Conjunctiva/sclera: Conjunctivae normal.  Cardiovascular:     Rate and Rhythm: Normal rate.  Pulmonary:     Effort: Pulmonary effort is normal. No respiratory distress.  Abdominal:     General: There is no distension.  Musculoskeletal:        General: Normal range of motion.     Cervical back: Neck supple.  Skin:    General: Skin is warm and dry.  Neurological:     Mental Status: She is alert and oriented to person, place, and time.     UC Treatments / Results  Labs (  all labs ordered are listed, but only abnormal results are displayed) Labs Reviewed  CERVICOVAGINAL ANCILLARY ONLY     EKG   Radiology No results found.  Procedures Procedures (including critical care time)  Medications Ordered in UC Medications - No data to display  Initial Impression / Assessment and Plan / UC Course  I have reviewed the triage vital signs and the nursing notes.  Pertinent labs & imaging results that were available during my care of the patient were reviewed by me and considered in my medical decision making (see chart for details).     Vaginal discharge/vaginitis-empirically treating for BV with Flagyl, swab pending for confirmation, alter therapy as needed based off results.  Discussed strict return precautions. Patient verbalized understanding and is agreeable with plan.  Final Clinical Impressions(s) / UC Diagnoses   Final diagnoses:  Vaginal discharge     Discharge Instructions      Begin flagyl twice daily with food x 1 week; no alcohol until 24 hours after last tablet  We are testing you for Gonorrhea, Chlamydia, Trichomonas, Yeast and Bacterial Vaginosis. We will call you if anything is positive and let you know if you require any further treatment. Please inform partners of any positive results.   Please return if symptoms not improving with treatment, development of fever, nausea, vomiting, abdominal pain.      ED Prescriptions     Medication Sig Dispense Auth. Provider   metroNIDAZOLE (FLAGYL) 500 MG tablet Take 1 tablet (500 mg total) by mouth 2 (two) times daily for 7 days. 14 tablet Treyveon Mochizuki, [place] C, PA-C      PDMP not reviewed this encounter.   Lew Dawes, PA-C 06/29/20 1250

## 2020-06-30 ENCOUNTER — Telehealth (HOSPITAL_COMMUNITY): Payer: Self-pay | Admitting: Emergency Medicine

## 2020-06-30 LAB — CERVICOVAGINAL ANCILLARY ONLY
Bacterial Vaginitis (gardnerella): NEGATIVE
Candida Glabrata: NEGATIVE
Candida Vaginitis: POSITIVE — AB
Chlamydia: NEGATIVE
Comment: NEGATIVE
Comment: NEGATIVE
Comment: NEGATIVE
Comment: NEGATIVE
Comment: NEGATIVE
Comment: NORMAL
Neisseria Gonorrhea: NEGATIVE
Trichomonas: POSITIVE — AB

## 2020-06-30 MED ORDER — FLUCONAZOLE 150 MG PO TABS: 150.0000 mg | ORAL_TABLET | Freq: Once | ORAL | 0 refills | Status: AC

## 2020-07-09 ENCOUNTER — Ambulatory Visit: Payer: Self-pay

## 2020-07-18 ENCOUNTER — Other Ambulatory Visit: Payer: Self-pay

## 2020-07-18 ENCOUNTER — Ambulatory Visit: Payer: Self-pay

## 2020-07-18 ENCOUNTER — Ambulatory Visit
Admission: EM | Admit: 2020-07-18 | Discharge: 2020-07-18 | Disposition: A | Discharge status: Home / Self Care | Payer: Medicaid Other | Attending: Emergency Medicine | Admitting: Emergency Medicine

## 2020-07-18 DIAGNOSIS — A599 Trichomoniasis, unspecified: Secondary | ICD-10-CM | POA: Insufficient documentation

## 2020-07-18 DIAGNOSIS — Z09 Encounter for follow-up examination after completed treatment for conditions other than malignant neoplasm: Secondary | ICD-10-CM | POA: Insufficient documentation

## 2020-07-18 LAB — POCT URINALYSIS DIP (MANUAL ENTRY)
Blood, UA: NEGATIVE
Glucose, UA: NEGATIVE mg/dL
Ketones, POC UA: NEGATIVE mg/dL
Nitrite, UA: NEGATIVE
Protein Ur, POC: 30 mg/dL — AB
Spec Grav, UA: >=1.03 — AB (ref 1.010–1.025)
Urobilinogen, UA: 0.2 E.U./dL
pH, UA: 5.5 (ref 5.0–8.0)

## 2020-07-18 MED ORDER — METRONIDAZOLE 500 MG PO TABS: 2000.0000 mg | ORAL_TABLET | Freq: Once | ORAL | 0 refills | Status: AC

## 2020-07-18 NOTE — ED Triage Notes (Signed)
Pt recently tx for trichmonasis. She notes missing three doses, due to vomiting right after swallowing the meds. Confirms vaginal odor and itching. Confirms urinary frequency and urgency. Denies hematuria and dysuria. Pt requesting repeat STD testing.

## 2020-07-18 NOTE — ED Provider Notes (Signed)
[name]    [name]: [id number] Arrival date & time: 07/18/20  1705      History   Chief Complaint Chief Complaint  Patient presents with   Vaginal Discharge   SEXUALLY TRANSMITTED DISEASE    HPI Colleen Stone is a 25 y.o. female history of asthma, hypertension, PCOS presenting today for STD retesting.  Patient was seen here on 6/16 and tested positive for trichomonas and yeast, completed course of metronidazole and Diflucan.  Reports poor tolerance of Flagyl causing nausea/vomiting.  Reports 3 doses were missed.  Here to ensure resolution of infection.  She does continue to have some vaginal odor and itching.  Does report urinary frequency as well.  HPI  Past Medical History:  Diagnosis Date   Anxiety    Asthma    Chlamydia    Gonorrhea    Hypertension    takes labetalol   MRSA infection    8th grade   Other acne 06/30/2008   Overview:  Acne   PCOS (polycystic ovarian syndrome)    Seizures (HCC)    Last seizure in 2017   Trichomonas infection     Patient Active Problem List   Diagnosis Date Noted   Hypertension    History of seizure disorder 06/15/2018   Bipolar 1 disorder (HCC) 01/13/2018   Family history of schizophrenia 01/13/2018   Smoker 01/13/2018   Atypical squamous cells of undetermined significance (ASCUS) on Papanicolaou smear of cervix 01/26/2013   Family history of breast cancer 08/31/2012    Past Surgical History:  Procedure Laterality Date   ADENOIDECTOMY     NASAL SEPTOPLASTY W/ TURBINOPLASTY     ROOT CANAL     TONSILLECTOMY     WISDOM TOOTH EXTRACTION      OB History     Gravida  2   Para  1   Term  1   Preterm      AB  1   Living  1      SAB  1   IAB      Ectopic      Multiple  0   Live Births  1            Home Medications    Prior to Admission medications   Medication Sig Start Date End Date Taking? Authorizing Provider  metroNIDAZOLE (FLAGYL) 500 MG tablet Take 4 tablets (2,000 mg total)  by mouth once for 1 dose. 07/18/20 07/18/20 Yes Alfard Cochrane C, PA-C  albuterol (PROVENTIL) (2.5 MG/3ML) 0.083% nebulizer solution Take 3 mLs (2.5 mg total) by nebulization every 6 (six) hours as needed for wheezing or shortness of breath. 08/05/18   Fulp, Cammie, MD  albuterol (VENTOLIN HFA) 108 (90 Base) MCG/ACT inhaler Inhale 2 puffs into the lungs every 6 (six) hours as needed for wheezing or shortness of breath. 08/05/18   Fulp, Cammie, MD  loratadine (CLARITIN) 10 MG tablet Take 1 tablet (10 mg total) by mouth daily. 04/18/20   Brock Bad, MD  ondansetron (ZOFRAN-ODT) 4 MG disintegrating tablet Take 1 tablet (4 mg total) by mouth every 8 (eight) hours as needed for nausea or vomiting. 06/08/20   Benjiman Core, MD  pantoprazole (PROTONIX) 20 MG tablet Take 1 tablet (20 mg total) by mouth daily. 06/08/20   Benjiman Core, MD  cetirizine (ZYRTEC ALLERGY) 10 MG tablet Take 1 tablet (10 mg total) by mouth 2 (two) times daily. 06/21/19 06/25/19  Muthersbaugh, Dahlia Client, PA-C  famotidine (PEPCID) 20 MG tablet Take 1  tablet (20 mg total) by mouth 2 (two) times daily. 06/21/19 06/25/19  Muthersbaugh, Dahlia Client, PA-C  labetalol (NORMODYNE) 200 MG tablet Take 1 tablet (200 mg total) by mouth 2 (two) times daily. 08/05/18 06/25/19  Cain Saupe, MD    Family History Family History  Problem Relation Age of Onset   Asthma Mother    Heart disease Mother        murmur   Hypertension Mother    Heart disease Father     Social History Social History   Tobacco Use   Smoking status: Former    Pack years: 0.00    Types: Cigarettes    Quit date: 10/03/2017    Years since quitting: 2.7   Smokeless tobacco: Never  Vaping Use   Vaping Use: Never used  Substance Use Topics   Alcohol use: Not Currently    Comment: Social   Drug use: Not Currently    Types: Marijuana    Comment: Last marijuana 11/19     Allergies   Red dye and Latex   Review of Systems Review of Systems  Constitutional:  Negative  for fever.  Respiratory:  Negative for shortness of breath.   Cardiovascular:  Negative for chest pain.  Gastrointestinal:  Negative for abdominal pain, diarrhea, nausea and vomiting.  Genitourinary:  Negative for dysuria, flank pain, genital sores, hematuria, menstrual problem, vaginal bleeding, vaginal discharge and vaginal pain.  Musculoskeletal:  Negative for back pain.  Skin:  Negative for rash.  Neurological:  Negative for dizziness, light-headedness and headaches.    Physical Exam Triage Vital Signs ED Triage Vitals  Enc Vitals Group     BP      Pulse      Resp      Temp      Temp src      SpO2      Weight      Height      Head Circumference      Peak Flow      Pain Score      Pain Loc      Pain Edu?      Excl. in GC?    No data found.  Updated Vital Signs BP 131/83 (BP Location: Left Arm)   Pulse 80   Temp 98.4 F (36.9 C) (Oral)   Resp 18   SpO2 95%   Visual Acuity Right Eye Distance:   Left Eye Distance:   Bilateral Distance:    Right Eye Near:   Left Eye Near:    Bilateral Near:     Physical Exam Vitals and nursing note reviewed.  Constitutional:      Appearance: She is well-developed.     Comments: No acute distress  HENT:     Head: Normocephalic and atraumatic.     Nose: Nose normal.  Eyes:     Conjunctiva/sclera: Conjunctivae normal.  Cardiovascular:     Rate and Rhythm: Normal rate.  Pulmonary:     Effort: Pulmonary effort is normal. No respiratory distress.  Abdominal:     General: There is no distension.  Musculoskeletal:        General: Normal range of motion.     Cervical back: Neck supple.  Skin:    General: Skin is warm and dry.  Neurological:     Mental Status: She is alert and oriented to person, place, and time.     UC Treatments / Results  Labs (all labs ordered are listed, but only abnormal results  are displayed) Labs Reviewed  POCT URINALYSIS DIP (MANUAL ENTRY) - Abnormal; Notable for the following components:       Result Value   Bilirubin, UA small (*)    Spec Grav, UA >=1.030 (*)    Protein Ur, POC =30 (*)    Leukocytes, UA Trace (*)    All other components within normal limits  URINE CULTURE  CERVICOVAGINAL ANCILLARY ONLY    EKG   Radiology No results found.  Procedures Procedures (including critical care time)  Medications Ordered in UC Medications - No data to display  Initial Impression / Assessment and Plan / UC Course  I have reviewed the triage vital signs and the nursing notes.  Pertinent labs & imaging results that were available during my care of the patient were reviewed by me and considered in my medical decision making (see chart for details).     Vaginal swab pending to screen for resolution of trichomonas as well as further screen for any other infections/retesting.  Urine culture pending to more definitively rule out UTI as well.  Discussed with patient and opted to go ahead and empirically retreat for trichomonas with 2 g one-time dose with use of Zofran prior to taking medicine as she reports multiple episodes of vomiting throughout course of Flagyl.  Discussed strict return precautions. Patient verbalized understanding and is agreeable with plan.  Final Clinical Impressions(s) / UC Diagnoses   Final diagnoses:  Trichomonas infection  Follow up     Discharge Instructions      Repeat swab and urine culture pending Please take 4-8 mg Zofran approximately 30 to 45 minutes prior to taking repeat course of Flagyl (4 tabs once) Please follow-up if not improving or worsen     ED Prescriptions     Medication Sig Dispense Auth. Provider   metroNIDAZOLE (FLAGYL) 500 MG tablet Take 4 tablets (2,000 mg total) by mouth once for 1 dose. 4 tablet Demaree Liberto, [place] C, PA-C      PDMP not reviewed this encounter.   Lew Dawes, [place] 07/18/20 1829

## 2020-07-18 NOTE — Discharge Instructions (Addendum)
Repeat swab and urine culture pending Please take 4-8 mg Zofran approximately 30 to 45 minutes prior to taking repeat course of Flagyl (4 tabs once) Please follow-up if not improving or worsen

## 2020-07-19 LAB — CERVICOVAGINAL ANCILLARY ONLY
Bacterial Vaginitis (gardnerella): NEGATIVE
Candida Glabrata: NEGATIVE
Candida Vaginitis: NEGATIVE
Chlamydia: NEGATIVE
Comment: NEGATIVE
Comment: NEGATIVE
Comment: NEGATIVE
Comment: NEGATIVE
Comment: NEGATIVE
Comment: NORMAL
Neisseria Gonorrhea: NEGATIVE
Trichomonas: NEGATIVE

## 2020-07-20 LAB — URINE CULTURE: Culture: NO GROWTH

## 2020-07-28 ENCOUNTER — Encounter: Payer: Self-pay | Admitting: Physician Assistant

## 2020-07-28 ENCOUNTER — Telehealth: Payer: Medicaid Other | Admitting: Physician Assistant

## 2020-07-28 DIAGNOSIS — S060X0A Concussion without loss of consciousness, initial encounter: Secondary | ICD-10-CM
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
## 2020-07-28 NOTE — Patient Instructions (Signed)
     For an urgent face to face visit, [facility] has six urgent care centers for your convenience:     [facility] at [facility] Directions [phone] [address] [place], [place] [address]    [facility] Health Urgent Care Center [facility]) Get Driving Directions [phone] [address] [place], [place] [address]  [facility] Health Urgent Care Center [facility] [place]) Get Driving Directions [phone] [address] [place],  [place]  [address]  [facility] Health Urgent Care at [facility] Get Driving Directions [phone] [address], [address] [place], [place] [address]   [facility] Health Urgent Care at [facility] Get Driving Directions  [phone] [address] [place], [place] [address]   [facility] Health Urgent Care at [facility] Directions [phone] [address] [place], [place] [address]

## 2020-08-23 ENCOUNTER — Other Ambulatory Visit: Payer: Self-pay | Admitting: Obstetrics

## 2020-08-23 DIAGNOSIS — N760 Acute vaginitis: Secondary | ICD-10-CM

## 2020-08-23 DIAGNOSIS — B9689 Other specified bacterial agents as the cause of diseases classified elsewhere: Secondary | ICD-10-CM

## 2020-09-09 ENCOUNTER — Encounter: Payer: Self-pay | Admitting: General Practice

## 2020-09-09 ENCOUNTER — Encounter (HOSPITAL_COMMUNITY): Payer: Self-pay | Admitting: Obstetrics and Gynecology

## 2020-09-09 ENCOUNTER — Ambulatory Visit
Admission: EM | Admit: 2020-09-09 | Discharge: 2020-09-09 | Disposition: A | Discharge status: Home / Self Care | Payer: Medicaid Other | Attending: Internal Medicine | Admitting: Internal Medicine

## 2020-09-09 ENCOUNTER — Inpatient Hospital Stay (HOSPITAL_COMMUNITY)
Admission: AD | Admit: 2020-09-09 | Discharge: 2020-09-09 | Disposition: A | Discharge status: Home / Self Care | Payer: Medicaid Other | Attending: Obstetrics and Gynecology | Admitting: Obstetrics and Gynecology

## 2020-09-09 ENCOUNTER — Other Ambulatory Visit: Payer: Self-pay

## 2020-09-09 DIAGNOSIS — Z3201 Encounter for pregnancy test, result positive: Secondary | ICD-10-CM

## 2020-09-09 DIAGNOSIS — R42 Dizziness and giddiness: Secondary | ICD-10-CM

## 2020-09-09 DIAGNOSIS — Z79899 Other long term (current) drug therapy: Secondary | ICD-10-CM | POA: Diagnosis not present

## 2020-09-09 DIAGNOSIS — O210 Mild hyperemesis gravidarum: Secondary | ICD-10-CM

## 2020-09-09 DIAGNOSIS — Z3A01 Less than 8 weeks gestation of pregnancy: Secondary | ICD-10-CM | POA: Diagnosis not present

## 2020-09-09 DIAGNOSIS — R55 Syncope and collapse: Secondary | ICD-10-CM

## 2020-09-09 DIAGNOSIS — O10911 Unspecified pre-existing hypertension complicating pregnancy, first trimester: Secondary | ICD-10-CM | POA: Diagnosis not present

## 2020-09-09 LAB — CBC
HCT: 38.6 % (ref 36.0–46.0)
Hemoglobin: 12.5 g/dL (ref 12.0–15.0)
MCH: 25.5 pg — ABNORMAL LOW (ref 26.0–34.0)
MCHC: 32.4 g/dL (ref 30.0–36.0)
MCV: 78.6 fL — ABNORMAL LOW (ref 80.0–100.0)
Platelets: 393 10*3/uL (ref 150–400)
RBC: 4.91 MIL/uL (ref 3.87–5.11)
RDW: 16.7 % — ABNORMAL HIGH (ref 11.5–15.5)
WBC: 10.1 10*3/uL (ref 4.0–10.5)
nRBC: 0 % (ref 0.0–0.2)

## 2020-09-09 LAB — COMPREHENSIVE METABOLIC PANEL
ALT: 16 U/L (ref 0–44)
AST: 17 U/L (ref 15–41)
Albumin: 3.3 g/dL — ABNORMAL LOW (ref 3.5–5.0)
Alkaline Phosphatase: 82 U/L (ref 38–126)
Anion gap: 6 (ref 5–15)
BUN: <5 mg/dL — ABNORMAL LOW (ref 6–20)
CO2: 23 mmol/L (ref 22–32)
Calcium: 9.4 mg/dL (ref 8.9–10.3)
Chloride: 106 mmol/L (ref 98–111)
Creatinine, Ser: 0.67 mg/dL (ref 0.44–1.00)
GFR, Estimated: >60 mL/min (ref >60)
Glucose, Bld: 97 mg/dL (ref 70–99)
Potassium: 4.3 mmol/L (ref 3.5–5.1)
Sodium: 135 mmol/L (ref 135–145)
Total Bilirubin: 0.8 mg/dL (ref 0.3–1.2)
Total Protein: 7.3 g/dL (ref 6.5–8.1)

## 2020-09-09 LAB — WET PREP, GENITAL
Clue Cells Wet Prep HPF POC: NONE SEEN
Sperm: NONE SEEN
Trich, Wet Prep: NONE SEEN
Yeast Wet Prep HPF POC: NONE SEEN

## 2020-09-09 LAB — POCT URINALYSIS DIP (MANUAL ENTRY)
Bilirubin, UA: NEGATIVE
Blood, UA: NEGATIVE
Glucose, UA: NEGATIVE mg/dL
Ketones, POC UA: NEGATIVE mg/dL
Nitrite, UA: NEGATIVE
Protein Ur, POC: NEGATIVE mg/dL
Spec Grav, UA: 1.025 (ref 1.010–1.025)
Urobilinogen, UA: 0.2 E.U./dL
pH, UA: 6 (ref 5.0–8.0)

## 2020-09-09 LAB — POCT URINE PREGNANCY: Preg Test, Ur: POSITIVE — AB

## 2020-09-09 MED ORDER — ONDANSETRON 4 MG PO TBDP
8.0000 mg | ORAL_TABLET | Freq: Once | ORAL | Status: AC
  Administered 2020-09-09: 8 mg via ORAL
  Filled 2020-09-09: qty 2

## 2020-09-09 MED ORDER — FAMOTIDINE 40 MG/5ML PO SUSR: 20.0000 mg | Freq: Two times a day (BID) | ORAL | 0 refills | Status: DC

## 2020-09-09 MED ORDER — LIDOCAINE VISCOUS HCL 2 % MT SOLN
15.0000 mL | Freq: Once | OROMUCOSAL | Status: AC
  Administered 2020-09-09: 15 mL via ORAL
  Filled 2020-09-09: qty 15

## 2020-09-09 MED ORDER — ONDANSETRON 8 MG PO TBDP: 8.0000 mg | ORAL_TABLET | Freq: Three times a day (TID) | ORAL | 1 refills | Status: DC | PRN

## 2020-09-09 MED ORDER — ALUM & MAG HYDROXIDE-SIMETH 200-200-20 MG/5ML PO SUSP
30.0000 mL | Freq: Once | ORAL | Status: AC
  Administered 2020-09-09: 30 mL via ORAL
  Filled 2020-09-09: qty 30

## 2020-09-09 NOTE — Discharge Instructions (Addendum)
Please go to the hospital as soon as you leave urgent care for further evaluation and management. 

## 2020-09-09 NOTE — ED Provider Notes (Signed)
[name]    [name]: [id number] Arrival date & time: 09/09/20  1124      History   Chief Complaint Chief Complaint  Patient presents with   Near Syncope    HPI Colleen Stone is a 25 y.o. female.   Patient presents for evaluation of a near syncopal event that occurred prior to arrival. 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 Patient has also been having nausea and vomiting that started yesterday. Denies any upper respiratory symptoms, chest pain, urinary frequency, urinary burning, fever, chest pain, shortness of breath. Patient is still having some dizziness. Has history of epilepsy and has been prescribed depakote. Has not been taking regularly. Last menstrual cycle was about 2 months ago. Denies any abdominal pain, back pain, or diarrhea.    Near Syncope   Past Medical History:  Diagnosis Date   Anxiety    Asthma    Chlamydia    Gonorrhea    Hypertension    takes labetalol   MRSA infection    8th grade   Other acne 06/30/2008   Overview:  Acne   PCOS (polycystic ovarian syndrome)    Seizures (HCC)    Last seizure in 2017   Trichomonas infection     Patient Active Problem List   Diagnosis Date Noted   Hypertension    History of seizure disorder 06/15/2018   Bipolar 1 disorder (HCC) 01/13/2018   Family history of schizophrenia 01/13/2018   Smoker 01/13/2018   Atypical squamous cells of undetermined significance (ASCUS) on Papanicolaou smear of cervix 01/26/2013   Family history of breast cancer 08/31/2012    Past Surgical History:  Procedure Laterality Date   ADENOIDECTOMY     NASAL SEPTOPLASTY W/ TURBINOPLASTY     ROOT CANAL     TONSILLECTOMY     WISDOM TOOTH EXTRACTION      OB History     Gravida  2   Para  1   Term  1   Preterm      AB  1   Living  1      SAB  1   IAB      Ectopic      Multiple  0   Live Births  1            Home  Medications    Prior to Admission medications   Medication Sig Start Date End Date Taking? Authorizing Provider  albuterol (PROVENTIL) (2.5 MG/3ML) 0.083% nebulizer solution Take 3 mLs (2.5 mg total) by nebulization every 6 (six) hours as needed for wheezing or shortness of breath. 08/05/18   Fulp, Cammie, MD  albuterol (VENTOLIN HFA) 108 (90 Base) MCG/ACT inhaler Inhale 2 puffs into the lungs every 6 (six) hours as needed for wheezing or shortness of breath. 08/05/18   Fulp, Cammie, MD  loratadine (CLARITIN) 10 MG tablet Take 1 tablet (10 mg total) by mouth daily. 04/18/20   Brock Bad, MD  metroNIDAZOLE (FLAGYL) 500 MG tablet TAKE 1 TABLET(500 MG) BY MOUTH TWICE DAILY 08/23/20   Brock Bad, MD  ondansetron (ZOFRAN-ODT) 4 MG disintegrating tablet Take 1 tablet (4 mg total) by mouth every 8 (eight) hours as needed for nausea or vomiting. 06/08/20   Benjiman Core, MD  pantoprazole (PROTONIX) 20 MG tablet Take 1 tablet (20 mg total) by mouth daily. 06/08/20   Benjiman Core, MD  cetirizine (ZYRTEC ALLERGY) 10 MG tablet  Take 1 tablet (10 mg total) by mouth 2 (two) times daily. 06/21/19 06/25/19  Muthersbaugh, Dahlia Client, PA-C  famotidine (PEPCID) 20 MG tablet Take 1 tablet (20 mg total) by mouth 2 (two) times daily. 06/21/19 06/25/19  Muthersbaugh, Dahlia Client, PA-C  labetalol (NORMODYNE) 200 MG tablet Take 1 tablet (200 mg total) by mouth 2 (two) times daily. 08/05/18 06/25/19  Cain Saupe, MD    Family History Family History  Problem Relation Age of Onset   Asthma Mother    Heart disease Mother        murmur   Hypertension Mother    Heart disease Father     Social History Social History   Tobacco Use   Smoking status: Former    Types: Cigarettes    Quit date: 10/03/2017    Years since quitting: 2.9   Smokeless tobacco: Never  Vaping Use   Vaping Use: Never used  Substance Use Topics   Alcohol use: Not Currently    Comment: Social   Drug use: Not Currently    Types: Marijuana     Comment: Last marijuana 11/19     Allergies   Red dye and Latex   Review of Systems Review of Systems  Cardiovascular:  Positive for near-syncope.  Per HPI  Physical Exam Triage Vital Signs ED Triage Vitals  Enc Vitals Group     BP 09/09/20 1136 101/61     Pulse Rate 09/09/20 1136 82     Resp 09/09/20 1136 18     Temp 09/09/20 1136 (!) 97.3 F (36.3 C)     Temp Source 09/09/20 1136 Oral     SpO2 09/09/20 1136 100 %     Weight --      Height --      Head Circumference --      Peak Flow --      Pain Score 09/09/20 1140 0     Pain Loc --      Pain Edu? --      Excl. in GC? --    No data found.  Updated Vital Signs BP 101/61 (BP Location: Left Arm)   Pulse 82   Temp (!) 97.3 F (36.3 C) (Oral)   Resp 18   SpO2 100%   Visual Acuity Right Eye Distance:   Left Eye Distance:   Bilateral Distance:    Right Eye Near:   Left Eye Near:    Bilateral Near:     Physical Exam Constitutional:      General: She is not in acute distress.    Appearance: Normal appearance. She is not toxic-appearing or diaphoretic.  HENT:     Head: Normocephalic and atraumatic.     Right Ear: Tympanic membrane and ear canal normal.     Left Ear: Tympanic membrane and ear canal normal.     Nose: Nose normal.     Mouth/Throat:     Mouth: Mucous membranes are moist.  Eyes:     Extraocular Movements: Extraocular movements intact.     Conjunctiva/sclera: Conjunctivae normal.  Cardiovascular:     Rate and Rhythm: Normal rate and regular rhythm.     Pulses: Normal pulses.     Heart sounds: Normal heart sounds.  Pulmonary:     Effort: Pulmonary effort is normal. No respiratory distress.     Breath sounds: Normal breath sounds.  Abdominal:     General: Abdomen is flat. Bowel sounds are normal. There is no distension.     Palpations: Abdomen is  soft.     Tenderness: There is no abdominal tenderness.  Musculoskeletal:        General: Normal range of motion.  Skin:    General: Skin is  warm and dry.  Neurological:     General: No focal deficit present.     Mental Status: She is alert and oriented to person, place, and time. Mental status is at baseline.  Psychiatric:        Mood and Affect: Mood normal.        Behavior: Behavior normal.        Thought Content: Thought content normal.        Judgment: Judgment normal.     UC Treatments / Results  Labs (all labs ordered are listed, but only abnormal results are displayed) Labs Reviewed  POCT URINE PREGNANCY - Abnormal; Notable for the following components:      Result Value   Preg Test, Ur Positive (*)    All other components within normal limits  POCT URINALYSIS DIP (MANUAL ENTRY) - Abnormal; Notable for the following components:   Color, UA orange (*)    Leukocytes, UA Trace (*)    All other components within normal limits  URINE CULTURE    EKG   Radiology No results found.  Procedures Procedures (including critical care time)  Medications Ordered in UC Medications - No data to display  Initial Impression / Assessment and Plan / UC Course  I have reviewed the triage vital signs and the nursing notes.  Pertinent labs & imaging results that were available during my care of the patient were reviewed by me and considered in my medical decision making (see chart for details).     Pregnancy test was positive. Urinalysis was not definitive for UTI. EKG was NSR. Due to patient still having continued dizziness,history of epilepsy, and positive preg test, advised patient to go to the hospital for further evaluation and management. Patient was agreeable with plan. Vital signs stable at discharge. Agree with friend transporting patient to the hospital. Discussed strict return precautions. Patient verbalized understanding and is agreeable with plan.  Final Clinical Impressions(s) / UC Diagnoses   Final diagnoses:  Positive pregnancy test  Near syncope  Dizziness and giddiness     Discharge Instructions       Please go to the hospital as soon as you leave urgent care for further evaluation and management.      ED Prescriptions   None    PDMP not reviewed this encounter.   Lance Muss, FNP 09/09/20 1235

## 2020-09-09 NOTE — ED Triage Notes (Signed)
While doing eye lashes, started sweating, couldn't keep anything down, vomiting, has acid reflux, lightheaded, patient is epileptic

## 2020-09-09 NOTE — MAU Note (Signed)
Pt reports she was at work and felt like she was going to pass out. Stated she had had n/v for several days and had not eaten much. (Did not eat this morning.)Went to urgent care and they told her she was pregnant. Urine and EKG were normal so they sent her to MAU for further eval. Reports some abd cramping on and off.  Denies any vaginal bleeding but reports a thick discharge. Was called in a prescription for BV and is taking it.

## 2020-09-09 NOTE — MAU Provider Note (Addendum)
Patient Colleen Stone is a 25 y.o. G3P1011  At [redacted]w[redacted]d by LMP here with complaints of feeling lightheaded at work today and vomiting last night.  She did not faint. She did not lose consciousness. She denies vaginal bleeding, abdominal pain, fever, SOB. She has not had an appetite today and has not eaten. She denies any palpitations, chest pain, blurry vision, HA, floating spots or other concerns. She stands for work and she reports that she frequently goes "days" without eating because she doesn't have an appetite.   History     [name]: [id number]  Arrival date and time: 09/09/20 1256   None     Chief Complaint  Patient presents with   Near Syncope   Near Syncope This is a new problem. The current episode started today. The problem occurs rarely. The problem has been resolved. Associated symptoms include fatigue and vomiting. Pertinent negatives include no abdominal pain, chest pain, congestion or coughing. Nothing aggravates the symptoms.  She reports that she didn't eat today.   OB History     Gravida  3   Para  1   Term  1   Preterm      AB  1   Living  1      SAB  1   IAB      Ectopic      Multiple  0   Live Births  1           Past Medical History:  Diagnosis Date   Anxiety    Asthma    Chlamydia    Gonorrhea    Hypertension    takes labetalol   MRSA infection    8th grade   Other acne 06/30/2008   Overview:  Acne   PCOS (polycystic ovarian syndrome)    Seizures (HCC)    Last seizure in 2017   Trichomonas infection     Past Surgical History:  Procedure Laterality Date   ADENOIDECTOMY     NASAL SEPTOPLASTY W/ TURBINOPLASTY     ROOT CANAL     TONSILLECTOMY     WISDOM TOOTH EXTRACTION      Family History  Problem Relation Age of Onset   Asthma Mother    Heart disease Mother        murmur   Hypertension Mother    Heart disease Father     Social History   Tobacco Use   Smoking status: Former    Types: Cigarettes    Quit date:  10/03/2017    Years since quitting: 2.9   Smokeless tobacco: Never  Vaping Use   Vaping Use: Never used  Substance Use Topics   Alcohol use: Yes    Comment: Social   Drug use: Yes    Types: Marijuana    Comment: Last marijuana 09/08/20    Allergies:  Allergies  Allergen Reactions   Red Dye Nausea And Vomiting and Other (See Comments)    Her body rejects it   Latex Hives and Swelling    Medications Prior to Admission  Medication Sig Dispense Refill Last Dose   metroNIDAZOLE (FLAGYL) 500 MG tablet TAKE 1 TABLET(500 MG) BY MOUTH TWICE DAILY 14 tablet 2 09/09/2020   ondansetron (ZOFRAN-ODT) 4 MG disintegrating tablet Take 1 tablet (4 mg total) by mouth every 8 (eight) hours as needed for nausea or vomiting. 8 tablet 0 09/08/2020   albuterol (PROVENTIL) (2.5 MG/3ML) 0.083% nebulizer solution Take 3 mLs (2.5 mg total) by nebulization every 6 (six) hours  as needed for wheezing or shortness of breath. 150 mL 1    albuterol (VENTOLIN HFA) 108 (90 Base) MCG/ACT inhaler Inhale 2 puffs into the lungs every 6 (six) hours as needed for wheezing or shortness of breath. 18 g 4    loratadine (CLARITIN) 10 MG tablet Take 1 tablet (10 mg total) by mouth daily. 30 tablet 11    pantoprazole (PROTONIX) 20 MG tablet Take 1 tablet (20 mg total) by mouth daily. 14 tablet 0 More than a month    Review of Systems  Constitutional:  Positive for fatigue.  HENT:  Negative for congestion.   Respiratory:  Negative for cough.   Cardiovascular:  Positive for near-syncope. Negative for chest pain.  Gastrointestinal:  Positive for vomiting. Negative for abdominal pain.  Physical Exam   Resp. rate 18, last menstrual period 07/25/2020.  Physical Exam Constitutional:      Appearance: Normal appearance.  Cardiovascular:     Rate and Rhythm: Normal rate.  Pulmonary:     Effort: Pulmonary effort is normal.  Neurological:     Mental Status: She is alert.    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  Patient had Zofran, GI cocktail and felt much better. Desires discharge. She had no episodes of vomiting while in MAU. BP is normal; she is not hypotensive.   Patient Vitals for the past 24 hrs:  BP Pulse Resp  09/09/20 1653 133/81 86 --  09/09/20 1320 -- -- 18    Assessment and Plan   1. Morning sickness   -patient stable for discharge -patient will call [facility] to set up prenatal visit in 6 weeks -RX for Zofran given -discussed when to return to MAU, diet changes, importance of medication adherence.  -all questions answered.    Charlesetta Garibaldi Zlata Alcaide 09/09/2020, 3:45 PM

## 2020-09-10 LAB — URINE CULTURE: Culture: <10000 — AB

## 2020-09-11 LAB — GC/CHLAMYDIA PROBE AMP (~~LOC~~) NOT AT ARMC
Chlamydia: NEGATIVE
Comment: NEGATIVE
Comment: NORMAL
Neisseria Gonorrhea: POSITIVE — AB

## 2020-09-12 ENCOUNTER — Inpatient Hospital Stay (HOSPITAL_COMMUNITY): Payer: Medicaid Other

## 2020-09-12 ENCOUNTER — Inpatient Hospital Stay (HOSPITAL_COMMUNITY)
Admission: AD | Admit: 2020-09-12 | Discharge: 2020-09-13 | Disposition: A | Discharge status: Home / Self Care | Payer: Medicaid Other | Attending: Obstetrics and Gynecology | Admitting: Obstetrics and Gynecology

## 2020-09-12 ENCOUNTER — Other Ambulatory Visit: Payer: Self-pay

## 2020-09-12 DIAGNOSIS — O26891 Other specified pregnancy related conditions, first trimester: Secondary | ICD-10-CM | POA: Diagnosis not present

## 2020-09-12 DIAGNOSIS — R109 Unspecified abdominal pain: Secondary | ICD-10-CM | POA: Diagnosis not present

## 2020-09-12 DIAGNOSIS — A549 Gonococcal infection, unspecified: Secondary | ICD-10-CM | POA: Diagnosis not present

## 2020-09-12 DIAGNOSIS — Z87891 Personal history of nicotine dependence: Secondary | ICD-10-CM | POA: Insufficient documentation

## 2020-09-12 DIAGNOSIS — R102 Pelvic and perineal pain: Secondary | ICD-10-CM | POA: Diagnosis not present

## 2020-09-12 DIAGNOSIS — Z3A01 Less than 8 weeks gestation of pregnancy: Secondary | ICD-10-CM | POA: Insufficient documentation

## 2020-09-12 DIAGNOSIS — R1030 Lower abdominal pain, unspecified: Secondary | ICD-10-CM | POA: Insufficient documentation

## 2020-09-12 DIAGNOSIS — O209 Hemorrhage in early pregnancy, unspecified: Secondary | ICD-10-CM | POA: Insufficient documentation

## 2020-09-12 DIAGNOSIS — O99891 Other specified diseases and conditions complicating pregnancy: Secondary | ICD-10-CM

## 2020-09-12 DIAGNOSIS — O98211 Gonorrhea complicating pregnancy, first trimester: Secondary | ICD-10-CM

## 2020-09-12 LAB — URINALYSIS, ROUTINE W REFLEX MICROSCOPIC
Bilirubin Urine: NEGATIVE
Glucose, UA: NEGATIVE mg/dL
Hgb urine dipstick: NEGATIVE
Ketones, ur: NEGATIVE mg/dL
Leukocytes,Ua: NEGATIVE
Nitrite: NEGATIVE
Protein, ur: NEGATIVE mg/dL
Specific Gravity, Urine: 1.017 (ref 1.005–1.030)
pH: 5 (ref 5.0–8.0)

## 2020-09-12 MED ORDER — CEFTRIAXONE SODIUM 500 MG IJ SOLR
500.0000 mg | Freq: Once | INTRAMUSCULAR | Status: AC
  Administered 2020-09-12: 500 mg via INTRAMUSCULAR
  Filled 2020-09-12: qty 500

## 2020-09-12 MED ORDER — ACETAMINOPHEN 500 MG PO TABS
1000.0000 mg | ORAL_TABLET | Freq: Once | ORAL | Status: AC
  Administered 2020-09-12: 1000 mg via ORAL
  Filled 2020-09-12: qty 2

## 2020-09-12 NOTE — MAU Note (Signed)
Pt states "I checked my MyChart today and saw that I had a positive result from my last visit. I also started cramping and bleeding today." Bleeding is slight when she wipes.

## 2020-09-12 NOTE — MAU Provider Note (Signed)
History     [name]: [id number]  Arrival date and time: 09/12/20 2128   Event Date/Time   First Provider Initiated Contact with Patient 09/12/20 2253      Chief Complaint  Patient presents with   Vaginal Bleeding   Abdominal Cramping   Colleen Stone is a 25 y.o. G3P1011 at [redacted]w[redacted]d by Definite LMP of July 25, 2020 who plans to receive care at [facility].  She presents today for Vaginal Bleeding and Abdominal Cramping.  She reports her symptoms started about 2 hours ago and was with wiping.  She states "the first time it was enough to get worried" and reports it was light red in color.  She states the 2nd time "was lighter" in color and quantity.  She further states "it looks like it was more so mixed with discharge." She reports cramping started today around 0600.  She reports it is located in her lower abdomen, bilaterally.  She describes it as "radiating pain" and states it is intermittent, but is currently constant.  She rates the pain from a "3 to a 5."  Patient reports some pain with urination and clarifies that it occurs prior to urination and not during.  She endorses that the cramping worsens.  She states she has not taken anything for the pain and has not identified any relieving or other worsening factors.  Patient denies sexual activity in the past 3 days.    OB History     Gravida  3   Para  1   Term  1   Preterm      AB  1   Living  1      SAB  1   IAB      Ectopic      Multiple  0   Live Births  1           Past Medical History:  Diagnosis Date   Anxiety    Asthma    Chlamydia    Gonorrhea    Hypertension    takes labetalol   MRSA infection    8th grade   Other acne 06/30/2008   Overview:  Acne   PCOS (polycystic ovarian syndrome)    Seizures (HCC)    Last seizure in 2017   Trichomonas infection     Past Surgical History:  Procedure Laterality Date   ADENOIDECTOMY     NASAL SEPTOPLASTY W/ TURBINOPLASTY     ROOT CANAL     TONSILLECTOMY      WISDOM TOOTH EXTRACTION      Family History  Problem Relation Age of Onset   Asthma Mother    Heart disease Mother        murmur   Hypertension Mother    Heart disease Father     Social History   Tobacco Use   Smoking status: Former    Types: Cigarettes    Quit date: 10/03/2017    Years since quitting: 2.9   Smokeless tobacco: Never  Vaping Use   Vaping Use: Never used  Substance Use Topics   Alcohol use: Yes    Comment: Social   Drug use: Yes    Types: Marijuana    Comment: Last marijuana 09/08/20    Allergies:  Allergies  Allergen Reactions   Red Dye Nausea And Vomiting and Other (See Comments)    Her body rejects it   Latex Hives and Swelling    Medications Prior to Admission  Medication Sig Dispense Refill Last Dose  albuterol (PROVENTIL) (2.5 MG/3ML) 0.083% nebulizer solution Take 3 mLs (2.5 mg total) by nebulization every 6 (six) hours as needed for wheezing or shortness of breath. 150 mL 1    albuterol (VENTOLIN HFA) 108 (90 Base) MCG/ACT inhaler Inhale 2 puffs into the lungs every 6 (six) hours as needed for wheezing or shortness of breath. 18 g 4    famotidine (PEPCID) 40 MG/5ML suspension Take 2.5 mLs (20 mg total) by mouth 2 (two) times daily. 90 mL 0    loratadine (CLARITIN) 10 MG tablet Take 1 tablet (10 mg total) by mouth daily. 30 tablet 11    ondansetron (ZOFRAN ODT) 8 MG disintegrating tablet Take 1 tablet (8 mg total) by mouth every 8 (eight) hours as needed for nausea or vomiting. 60 tablet 1     Review of Systems  Gastrointestinal:  Positive for abdominal pain, constipation (Last BM 3 days ago and hard to pass with little output.) and nausea. Negative for diarrhea and vomiting.  Genitourinary:  Positive for pelvic pain, vaginal bleeding and vaginal discharge (Thick, Nonodorous). Negative for difficulty urinating and dysuria.  Neurological:  Negative for dizziness, light-headedness and headaches (H/O Migraines, None currently).  Physical Exam    Blood pressure 114/71, pulse 93, temperature 98.1 F (36.7 C), temperature source Oral, resp. rate 18, last menstrual period 07/25/2020, SpO2 100 %.  Physical Exam Constitutional:      Appearance: Normal appearance.  HENT:     Head: Normocephalic and atraumatic.  Eyes:     Conjunctiva/sclera: Conjunctivae normal.  Cardiovascular:     Rate and Rhythm: Normal rate.     Heart sounds: Normal heart sounds.  Pulmonary:     Effort: Pulmonary effort is normal.     Breath sounds: Normal breath sounds.  Abdominal:     General: Bowel sounds are normal.     Tenderness: There is generalized abdominal tenderness.  Musculoskeletal:     Cervical back: Normal range of motion.  Skin:    General: Skin is warm and dry.  Neurological:     Mental Status: She is alert and oriented to person, place, and time.  Psychiatric:        Mood and Affect: Mood normal.        Behavior: Behavior normal.        Thought Content: Thought content normal.    MAU Course  Procedures Results for orders placed or performed during the hospital encounter of 09/12/20 (from the past 24 hour(s))  Urinalysis, Routine w reflex microscopic     Status: Abnormal   Collection Time: 09/12/20 10:15 PM  Result Value Ref Range   Color, Urine YELLOW YELLOW   APPearance HAZY (A) CLEAR   Specific Gravity, Urine 1.017 1.005 - 1.030   pH 5.0 5.0 - 8.0   Glucose, UA NEGATIVE NEGATIVE mg/dL   Hgb urine dipstick NEGATIVE NEGATIVE   Bilirubin Urine NEGATIVE NEGATIVE   Ketones, ur NEGATIVE NEGATIVE mg/dL   Protein, ur NEGATIVE NEGATIVE mg/dL   Nitrite NEGATIVE NEGATIVE   Leukocytes,Ua NEGATIVE NEGATIVE  hCG, quantitative, pregnancy     Status: Abnormal   Collection Time: 09/12/20 10:16 PM  Result Value Ref Range   hCG, Beta Chain, Quant, S 91,624 (H) <5 mIU/mL   US OB LESS THAN 14 WEEKS WITH OB TRANSVAGINAL  Result Date: 09/12/2020 CLINICAL DATA:  Pregnant, abdominal pain, slight vaginal bleeding EXAM: OBSTETRIC <14 WK [place] AND  TRANSVAGINAL OB US TECHNIQUE: Both transabdominal and transvaginal ultrasound examinations were performed for complete evaluation  of the gestation as well as the maternal uterus, adnexal regions, and pelvic cul-de-sac. Transvaginal technique was performed to assess early pregnancy. COMPARISON:  None. FINDINGS: Intrauterine gestational sac: Single Yolk sac:  Visualized. Embryo:  Visualized. Cardiac Activity: Visualized. Heart Rate: 111 bpm CRL:  6.0 mm   6 w   3 d                  [place] EDC: 05/05/2021 Subchorionic hemorrhage:  None visualized. Maternal uterus/adnexae: Bilateral ovaries are within normal limits, noting a left corpus luteum. No free fluid. IMPRESSION: Single live intrauterine gestation, with estimated gestational age [redacted] weeks 3 days by crown-rump length, as above. Electronically Signed   By: Charline Bills M.D.   On: 09/12/2020 23:35    MDM hCG Ultrasound Treatment Physical Exam Analgesic Assessment and Plan  25 year old G3P1011 Gonorrhea Abdominal Cramping Vaginal Spotting  -POC Reviewed. -Exam performed. -Speculum exam deferred d/t recent exam. -Discussed how GT infection could cause current symptoms. -Reviewed treatment with Rocephin. -Discussed need for partner treatment.  Patient states no longer involved.  -Offered and accepts tylenol for pain. -Will perform labs and Ultrasound to confirm fetal viability. -Will await results.   Cherre Robins 09/12/2020, 10:53 PM   Reassessment (11:59 PM) SIUP at 6.3 weeks  -Results as above. -Provider to bedside to discuss.  -Informed that EDD will remain April 18. -Discussed usage of tylenol at home. -Encouraged to call or return to MAU if symptoms worsen or with the onset of new symptoms. -Discharged to home in stable condition.  Cherre Robins MSN, CNM Advanced Practice Provider, [facility] [employer]

## 2020-09-13 ENCOUNTER — Encounter: Payer: Self-pay | Admitting: Student

## 2020-09-13 DIAGNOSIS — A549 Gonococcal infection, unspecified: Secondary | ICD-10-CM

## 2020-09-13 DIAGNOSIS — O26891 Other specified pregnancy related conditions, first trimester: Secondary | ICD-10-CM | POA: Diagnosis not present

## 2020-09-13 DIAGNOSIS — R109 Unspecified abdominal pain: Secondary | ICD-10-CM | POA: Diagnosis not present

## 2020-09-13 DIAGNOSIS — Z3A01 Less than 8 weeks gestation of pregnancy: Secondary | ICD-10-CM

## 2020-09-13 DIAGNOSIS — O98211 Gonorrhea complicating pregnancy, first trimester: Secondary | ICD-10-CM | POA: Insufficient documentation

## 2020-09-13 LAB — HCG, QUANTITATIVE, PREGNANCY: hCG, Beta Chain, Quant, S: 91624 m[IU]/mL — ABNORMAL HIGH (ref <5)

## 2020-09-28 ENCOUNTER — Other Ambulatory Visit: Payer: Self-pay

## 2020-09-28 ENCOUNTER — Encounter (HOSPITAL_COMMUNITY): Payer: Self-pay | Admitting: Obstetrics & Gynecology

## 2020-09-28 ENCOUNTER — Inpatient Hospital Stay (HOSPITAL_COMMUNITY)
Admission: AD | Admit: 2020-09-28 | Discharge: 2020-09-29 | Disposition: A | Discharge status: Home / Self Care | Payer: Medicaid Other | Attending: Obstetrics & Gynecology | Admitting: Obstetrics & Gynecology

## 2020-09-28 DIAGNOSIS — Z20822 Contact with and (suspected) exposure to covid-19: Secondary | ICD-10-CM | POA: Diagnosis not present

## 2020-09-28 DIAGNOSIS — B373 Candidiasis of vulva and vagina: Secondary | ICD-10-CM | POA: Diagnosis not present

## 2020-09-28 DIAGNOSIS — R1030 Lower abdominal pain, unspecified: Secondary | ICD-10-CM | POA: Diagnosis present

## 2020-09-28 DIAGNOSIS — O99891 Other specified diseases and conditions complicating pregnancy: Secondary | ICD-10-CM | POA: Diagnosis not present

## 2020-09-28 DIAGNOSIS — O98811 Other maternal infectious and parasitic diseases complicating pregnancy, first trimester: Secondary | ICD-10-CM | POA: Diagnosis not present

## 2020-09-28 DIAGNOSIS — Z87891 Personal history of nicotine dependence: Secondary | ICD-10-CM | POA: Insufficient documentation

## 2020-09-28 DIAGNOSIS — Z3A09 9 weeks gestation of pregnancy: Secondary | ICD-10-CM | POA: Diagnosis not present

## 2020-09-28 DIAGNOSIS — B379 Candidiasis, unspecified: Secondary | ICD-10-CM

## 2020-09-28 DIAGNOSIS — R059 Cough, unspecified: Secondary | ICD-10-CM | POA: Diagnosis not present

## 2020-09-28 LAB — URINALYSIS, ROUTINE W REFLEX MICROSCOPIC
Bilirubin Urine: NEGATIVE
Glucose, UA: NEGATIVE mg/dL
Hgb urine dipstick: NEGATIVE
Ketones, ur: NEGATIVE mg/dL
Leukocytes,Ua: NEGATIVE
Nitrite: NEGATIVE
Protein, ur: NEGATIVE mg/dL
Specific Gravity, Urine: 1.011 (ref 1.005–1.030)
pH: 7 (ref 5.0–8.0)

## 2020-09-28 LAB — RESP PANEL BY RT-PCR (FLU A&B, COVID) ARPGX2
Influenza A by PCR: NEGATIVE
Influenza B by PCR: NEGATIVE
SARS Coronavirus 2 by RT PCR: NEGATIVE

## 2020-09-28 LAB — WET PREP, GENITAL
Clue Cells Wet Prep HPF POC: NONE SEEN
Sperm: NONE SEEN
Trich, Wet Prep: NONE SEEN
Yeast Wet Prep HPF POC: NONE SEEN

## 2020-09-28 MED ORDER — DM-GUAIFENESIN ER 30-600 MG PO TB12
1.0000 | ORAL_TABLET | Freq: Once | ORAL | Status: AC
  Administered 2020-09-28: 1 via ORAL
  Filled 2020-09-28: qty 1

## 2020-09-28 MED ORDER — BENZONATATE 100 MG PO CAPS
100.0000 mg | ORAL_CAPSULE | Freq: Once | ORAL | Status: AC
  Administered 2020-09-28: 100 mg via ORAL
  Filled 2020-09-28: qty 1

## 2020-09-28 NOTE — MAU Note (Signed)
Pt reports she has had lower abd pain and cramping. C/o vaginal discharge (thinks it may be yeast infection. ) Stated she has had cough and nasal congestion for about 1 week. Denies any fever or chills

## 2020-09-29 DIAGNOSIS — B373 Candidiasis of vulva and vagina: Secondary | ICD-10-CM

## 2020-09-29 DIAGNOSIS — R059 Cough, unspecified: Secondary | ICD-10-CM

## 2020-09-29 DIAGNOSIS — O98811 Other maternal infectious and parasitic diseases complicating pregnancy, first trimester: Secondary | ICD-10-CM

## 2020-09-29 DIAGNOSIS — O99891 Other specified diseases and conditions complicating pregnancy: Secondary | ICD-10-CM

## 2020-09-29 DIAGNOSIS — Z3A09 9 weeks gestation of pregnancy: Secondary | ICD-10-CM

## 2020-09-29 MED ORDER — TERCONAZOLE 0.4 % VA CREA: 1.0000 | TOPICAL_CREAM | Freq: Every day | VAGINAL | 0 refills | Status: DC

## 2020-09-29 NOTE — MAU Provider Note (Signed)
None     Chief Complaint:  Abdominal Pain and Cough   Colleen Stone is  25 y.o. G3P1011 at [redacted]w[redacted]d presents complaining of Abdominal Pain and Cough  Per RN:   Pt reports she has had lower abd pain and cramping. C/o vaginal discharge (thinks it may be yeast infection. ) Stated she has had cough and nasal congestion for about 1 week. Denies any fever or chills. Treated for GC 14 days ago.   Obstetrical/Gynecological History: OB History     Gravida  3   Para  1   Term  1   Preterm      AB  1   Living  1      SAB  1   IAB      Ectopic      Multiple  0   Live Births  1          Past Medical History: Past Medical History:  Diagnosis Date   Anxiety    Asthma    Chlamydia    Gonorrhea    Hypertension    takes labetalol   MRSA infection    8th grade   Other acne 06/30/2008   Overview:  Acne   PCOS (polycystic ovarian syndrome)    Seizures (HCC)    Last seizure in 2017   Trichomonas infection     Past Surgical History: Past Surgical History:  Procedure Laterality Date   ADENOIDECTOMY     NASAL SEPTOPLASTY W/ TURBINOPLASTY     ROOT CANAL     TONSILLECTOMY     WISDOM TOOTH EXTRACTION      Family History: Family History  Problem Relation Age of Onset   Asthma Mother    Heart disease Mother        murmur   Hypertension Mother    Heart disease Father     Social History: Social History   Tobacco Use   Smoking status: Former    Types: Cigarettes    Quit date: 10/03/2017    Years since quitting: 2.9   Smokeless tobacco: Never  Vaping Use   Vaping Use: Never used  Substance Use Topics   Alcohol use: Not Currently    Comment: Social   Drug use: Yes    Types: Marijuana    Comment: Last marijuana 09/08/20    Allergies:  Allergies  Allergen Reactions   Red Dye Nausea And Vomiting and Other (See Comments)    Her body rejects it   Latex Hives and Swelling    Meds:  Medications Prior to Admission  Medication Sig Dispense Refill Last  Dose   albuterol (PROVENTIL) (2.5 MG/3ML) 0.083% nebulizer solution Take 3 mLs (2.5 mg total) by nebulization every 6 (six) hours as needed for wheezing or shortness of breath. 150 mL 1 Past Week   albuterol (VENTOLIN HFA) 108 (90 Base) MCG/ACT inhaler Inhale 2 puffs into the lungs every 6 (six) hours as needed for wheezing or shortness of breath. 18 g 4 Past Week   famotidine (PEPCID) 40 MG/5ML suspension Take 2.5 mLs (20 mg total) by mouth 2 (two) times daily. 90 mL 0 Past Week   ondansetron (ZOFRAN ODT) 8 MG disintegrating tablet Take 1 tablet (8 mg total) by mouth every 8 (eight) hours as needed for nausea or vomiting. 60 tablet 1 Past Week   loratadine (CLARITIN) 10 MG tablet Take 1 tablet (10 mg total) by mouth daily. 30 tablet 11     Review of Systems   Constitutional:  Negative for fever and chills Eyes: Negative for visual disturbances Respiratory: Negative for shortness of breath, dyspnea.  + dry cough Cardiovascular: Negative for chest pain or palpitations  Gastrointestinal: Negative for vomiting, diarrhea and constipation Genitourinary: Negative for dysuria and urgency. Vaginal itching Musculoskeletal: Negative for back pain, joint pain, myalgias.  Normal ROM  Neurological: Negative for dizziness and headaches    Physical Exam  Blood pressure 132/88, pulse (!) 104, temperature 98.7 F (37.1 C), temperature source Oral, resp. rate 18, height 5' 4.5" (1.638 m), weight 132.5 kg, last menstrual period 07/25/2020, SpO2 100 %. GENERAL: Well-developed, well-nourished female in no acute distress.  LUNGS: Normal respiratory effort HEART: Regular rate and rhythm. ABDOMEN: Soft, nontender, nondistended EXTREMITIES: Nontender, no edema, 2+ distal pulses. DTR's 2+ PELVIC:  SSE:  thick clumpy discharge w/o odor.  Vaginal sidewalls pink, cx non friable.  CERVICAL EXAM: Dilatation 0cm   Effacement 0%     FHT: 150 bedside US   Labs: Results for orders placed or performed during the  hospital encounter of 09/28/20 (from the past 24 hour(s))  Resp Panel by RT-PCR (Flu A&B, Covid) Nasopharyngeal Swab   Collection Time: 09/28/20  9:34 PM   Specimen: Nasopharyngeal Swab; Nasopharyngeal(NP) swabs in vial transport medium  Result Value Ref Range   SARS Coronavirus 2 by RT PCR NEGATIVE NEGATIVE   Influenza A by PCR NEGATIVE NEGATIVE   Influenza B by PCR NEGATIVE NEGATIVE  Urinalysis, Routine w reflex microscopic Urine, Clean Catch   Collection Time: 09/28/20 10:04 PM  Result Value Ref Range   Color, Urine YELLOW YELLOW   APPearance CLEAR CLEAR   Specific Gravity, Urine 1.011 1.005 - 1.030   pH 7.0 5.0 - 8.0   Glucose, UA NEGATIVE NEGATIVE mg/dL   Hgb urine dipstick NEGATIVE NEGATIVE   Bilirubin Urine NEGATIVE NEGATIVE   Ketones, ur NEGATIVE NEGATIVE mg/dL   Protein, ur NEGATIVE NEGATIVE mg/dL   Nitrite NEGATIVE NEGATIVE   Leukocytes,Ua NEGATIVE NEGATIVE  Wet prep, genital   Collection Time: 09/28/20 11:26 PM  Result Value Ref Range   Yeast Wet Prep HPF POC NONE SEEN NONE SEEN   Trich, Wet Prep NONE SEEN NONE SEEN   Clue Cells Wet Prep HPF POC NONE SEEN NONE SEEN   WBC, Wet Prep HPF POC MANY (A) NONE SEEN   Sperm NONE SEEN      Assessment: Colleen Stone is  25 y.o. G3P1011 at [redacted]w[redacted]d presents with cough, negative covid Clinical yeast infection.  Plan: Terezol X 7 days.  OTC cough meds  Jacklyn Shell 9/16/202212:28 AM

## 2020-09-29 NOTE — Discharge Instructions (Signed)
Safe Medications in Pregnancy   Acne: Benzoyl Peroxide Salicylic Acid  Backache/Headache: Tylenol: 2 regular strength every 4 hours OR              2 Extra strength every 6 hours  Colds/Coughs/Allergies: Benadryl (alcohol free) 25 mg every 6 hours as needed Breath right strips Claritin Cepacol throat lozenges Chloraseptic throat spray Cold-Eeze- up to three times per day Cough drops, alcohol free Delsym Flonase (by prescription only) Guaifenesin Mucinex Robitussin DM (plain only, alcohol free) Saline nasal spray/drops Sudafed (pseudoephedrine) & Actifed ** use only after [redacted] weeks gestation and if you do not have high blood pressure Tylenol Vicks Vaporub Zinc lozenges Zyrtec   Constipation: Colace Ducolax suppositories Fleet enema Glycerin suppositories Metamucil Milk of magnesia Miralax Senokot Smooth move tea  Diarrhea: Kaopectate Imodium A-D  *NO pepto Bismol  Hemorrhoids: Anusol Anusol HC Preparation H Tucks  Indigestion: Tums Maalox Mylanta Zantac  Pepcid  Insomnia: Benadryl (alcohol free) 25mg  every 6 hours as needed Tylenol PM Unisom, no Gelcaps  Leg Cramps: Tums MagGel  Nausea/Vomiting:  Bonine Dramamine Emetrol Ginger extract Sea bands Meclizine  Nausea medication to take during pregnancy:  Unisom (doxylamine succinate 25 mg tablets) Take one tablet daily at bedtime. If symptoms are not adequately controlled, the dose can be increased to a maximum recommended dose of two tablets daily (1/2 tablet in the morning, 1/2 tablet mid-afternoon and one at bedtime). Vitamin B6 100mg  tablets. Take one tablet twice a day (up to 200 mg per day).  Skin Rashes: Aveeno products Benadryl cream or 25mg  every 6 hours as needed Calamine Lotion 1% cortisone cream  Yeast infection: Gyne-lotrimin 7 Monistat 7   **If taking multiple medications, please check labels to avoid duplicating the same active ingredients **take medication as  directed on the label ** Do not exceed 4000 mg of tylenol in 24 hours **Do not take medications that contain aspirin or ibuprofen

## 2020-10-05 ENCOUNTER — Ambulatory Visit (INDEPENDENT_AMBULATORY_CARE_PROVIDER_SITE_OTHER): Payer: Medicaid Other

## 2020-10-05 DIAGNOSIS — O0991 Supervision of high risk pregnancy, unspecified, first trimester: Secondary | ICD-10-CM

## 2020-10-05 DIAGNOSIS — O0990 Supervision of high risk pregnancy, unspecified, unspecified trimester: Secondary | ICD-10-CM | POA: Insufficient documentation

## 2020-10-05 DIAGNOSIS — Z3A10 10 weeks gestation of pregnancy: Secondary | ICD-10-CM

## 2020-10-05 MED ORDER — VITAFOL GUMMIES 3.33-0.333-34.8 MG PO CHEW: 3.0000 | CHEWABLE_TABLET | Freq: Every day | ORAL | 11 refills | Status: DC

## 2020-10-05 MED ORDER — GOJJI WEIGHT SCALE MISC: 1.0000 | 0 refills | Status: DC

## 2020-10-05 NOTE — Progress Notes (Signed)
New OB Intake  I connected with  Colleen Stone on 10/05/20 at  2:00 PM EDT by telephone Video Visit and verified that I am speaking with the correct person using two identifiers. Nurse is located at [facility] and pt is located at home.  I discussed the limitations, risks, security and privacy concerns of performing an evaluation and management service by telephone and the availability of in person appointments. I also discussed with the patient that there may be a patient responsible charge related to this service. The patient expressed understanding and agreed to proceed.  I explained I am completing New OB Intake today. We discussed her EDD of 05/01/21 that is based on LMP of 07/25/20. Pt is G3/P1011. I reviewed her allergies, medications, Medical/Surgical/OB history, and appropriate screenings. I informed her of [facility] services. Based on history, this is a/an  pregnancy complicated by hypertension .   Patient Active Problem List   Diagnosis Date Noted   Gonorrhea affecting pregnancy in first trimester 09/13/2020   Hypertension    History of seizure disorder 06/15/2018   Bipolar 1 disorder (HCC) 01/13/2018   Family history of schizophrenia 01/13/2018   Smoker 01/13/2018   Atypical squamous cells of undetermined significance (ASCUS) on Papanicolaou smear of cervix 01/26/2013   Family history of breast cancer 08/31/2012    Concerns addressed today  Delivery Plans:  Plans to deliver at [facility].   MyChart/Babyscripts MyChart access verified. I explained pt will have some visits in office and some virtually. Babyscripts instructions given and order placed. Patient verifies receipt of registration text/e-mail. Account successfully created and app downloaded.  Blood Pressure Cuff  Patient has BP cuff at home already. Explained after first prenatal appt pt will check weekly and document in Babyscripts.  Weight scale: Patient    have weight scale. Weight scale ordered for patient to pick up  form [facility].   Anatomy US Explained first scheduled [place] will be around 19 weeks.   Labs Discussed Avelina Laine genetic screening with patient. Would like both Panorama and Horizon drawn at new OB visit. Routine prenatal labs needed.  Covid Vaccine Patient has not covid vaccine.   Mother/ Baby Dyad Candidate?    If yes, offer as possibility  Informed patient of [facility] website  and placed link in her AVS.   Social Determinants of Health Food Insecurity: Patient denies food insecurity. WIC Referral: Patient is interested in referral to [facility].  Transportation: Patient denies transportation needs. Childcare: Discussed no children allowed at ultrasound appointments. Offered childcare services; patient expresses need for childcare services. Childcare scheduled for appropriate appointments and information given to patient.  Send link to Pregnancy Navigators   Placed OB Box on problem list and updated  First visit review I reviewed new OB appt with pt. I explained she will have a pelvic exam, ob bloodwork with genetic screening, and PAP smear. Explained pt will be seen by Coral Ceo at first visit; encounter routed to appropriate provider. Explained that patient will be seen by pregnancy navigator following visit with provider. [facility] information placed in AVS.   Hamilton Capri, RN 10/05/2020  1:38 PM

## 2020-10-12 ENCOUNTER — Ambulatory Visit (INDEPENDENT_AMBULATORY_CARE_PROVIDER_SITE_OTHER): Payer: Medicaid Other

## 2020-10-12 ENCOUNTER — Other Ambulatory Visit: Payer: Self-pay

## 2020-10-12 ENCOUNTER — Other Ambulatory Visit (HOSPITAL_COMMUNITY)
Admission: RE | Admit: 2020-10-12 | Discharge: 2020-10-12 | Disposition: A | Discharge status: Home / Self Care | Payer: Medicaid Other | Source: Ambulatory Visit | Attending: Obstetrics | Admitting: Obstetrics

## 2020-10-12 ENCOUNTER — Encounter: Payer: Self-pay | Admitting: Obstetrics

## 2020-10-12 ENCOUNTER — Ambulatory Visit (INDEPENDENT_AMBULATORY_CARE_PROVIDER_SITE_OTHER): Payer: Medicaid Other | Admitting: Obstetrics

## 2020-10-12 VITALS — BP 128/86 | HR 95 | Wt 296.0 lb

## 2020-10-12 DIAGNOSIS — Z3A11 11 weeks gestation of pregnancy: Secondary | ICD-10-CM | POA: Diagnosis not present

## 2020-10-12 DIAGNOSIS — Z3480 Encounter for supervision of other normal pregnancy, unspecified trimester: Secondary | ICD-10-CM | POA: Diagnosis not present

## 2020-10-12 DIAGNOSIS — Z3481 Encounter for supervision of other normal pregnancy, first trimester: Secondary | ICD-10-CM | POA: Diagnosis not present

## 2020-10-12 DIAGNOSIS — O0990 Supervision of high risk pregnancy, unspecified, unspecified trimester: Secondary | ICD-10-CM

## 2020-10-12 DIAGNOSIS — Z3A01 Less than 8 weeks gestation of pregnancy: Secondary | ICD-10-CM | POA: Diagnosis not present

## 2020-10-12 DIAGNOSIS — O99210 Obesity complicating pregnancy, unspecified trimester: Secondary | ICD-10-CM

## 2020-10-12 DIAGNOSIS — O368390 Maternal care for abnormalities of the fetal heart rate or rhythm, unspecified trimester, not applicable or unspecified: Secondary | ICD-10-CM

## 2020-10-12 NOTE — Progress Notes (Signed)
Subjective:    Colleen Stone is being seen today for her first obstetrical visit.  This is not a planned pregnancy. She is at [redacted]w[redacted]d gestation. Her obstetrical history is significant for obesity. Relationship with FOB: significant other, not living together. Patient does intend to breast feed. Pregnancy history fully reviewed.  The information documented in the HPI was reviewed and verified.  Menstrual History: OB History     Gravida  3   Para  1   Term  1   Preterm      AB  1   Living  1      SAB  1   IAB      Ectopic      Multiple  0   Live Births  1            Patient's last menstrual period was 07/25/2020.    Past Medical History:  Diagnosis Date   Anxiety    Asthma    Chlamydia    Gonorrhea    Hypertension    takes labetalol   MRSA infection    8th grade   Other acne 06/30/2008   Overview:  Acne   PCOS (polycystic ovarian syndrome)    Seizures (HCC)    Last seizure in 2017   Trichomonas infection     Past Surgical History:  Procedure Laterality Date   ADENOIDECTOMY     NASAL SEPTOPLASTY W/ TURBINOPLASTY     ROOT CANAL     TONSILLECTOMY     WISDOM TOOTH EXTRACTION      (Not in a hospital admission)  Allergies  Allergen Reactions   Red Dye Nausea And Vomiting and Other (See Comments)    Her body rejects it   Latex Hives and Swelling    Social History   Tobacco Use   Smoking status: Former    Types: Cigarettes    Quit date: 10/03/2017    Years since quitting: 3.0   Smokeless tobacco: Never  Substance Use Topics   Alcohol use: Not Currently    Comment: not since pregnancy    Family History  Problem Relation Age of Onset   Asthma Mother    Heart disease Mother        murmur   Hypertension Mother    Heart disease Father      Review of Systems Constitutional: negative for weight loss Gastrointestinal: negative for vomiting Genitourinary:negative for genital lesions and vaginal discharge and  dysuria Musculoskeletal:negative for back pain Behavioral/Psych: negative for abusive relationship, depression, illegal drug usage and tobacco use    Objective:    BP 128/86   Pulse 95   Wt 296 lb (134.3 kg)   LMP 07/25/2020   BMI 50.02 kg/m  General Appearance:    Alert, cooperative, no distress, appears stated age  Head:    Normocephalic, without obvious abnormality, atraumatic  Eyes:    PERRL, conjunctiva/corneas clear, EOM's intact, fundi    benign, both eyes  Ears:    Normal TM's and external ear canals, both ears  Nose:   Nares normal, septum midline, mucosa normal, no drainage    or sinus tenderness  Throat:   Lips, mucosa, and tongue normal; teeth and gums normal  Neck:   Supple, symmetrical, trachea midline, no adenopathy;    thyroid:  no enlargement/tenderness/nodules; no carotid   bruit or JVD  Back:     Symmetric, no curvature, ROM normal, no CVA tenderness  Lungs:     Clear to auscultation bilaterally, respirations  unlabored  Chest Wall:    No tenderness or deformity   Heart:    Regular rate and rhythm, S1 and S2 normal, no murmur, rub   or gallop  Breast Exam:    No tenderness, masses, or nipple abnormality  Abdomen:     Soft, non-tender, bowel sounds active all four quadrants,    no masses, no organomegaly  Genitalia:    Normal female without lesion, discharge or tenderness  Extremities:   Extremities normal, atraumatic, no cyanosis or edema  Pulses:   2+ and symmetric all extremities  Skin:   Skin color, texture, turgor normal, no rashes or lesions  Lymph nodes:   Cervical, supraclavicular, and axillary nodes normal  Neurologic:   CNII-XII intact, normal strength, sensation and reflexes    throughout      Lab Review Urine pregnancy test Labs reviewed yes Radiologic studies reviewed yes  US OB LESS THAN 14 WEEKS WITH OB TRANSVAGINAL (Accession [id number]) (Order [id number]) Imaging Date: 09/12/2020 Department: [facility] Released  By/Authorizing: Gerrit Heck, CNM (auto-released)   Exam Status  Status  Final [99]   PACS Intelerad Image Link   Show images for US OB LESS THAN 14 WEEKS WITH OB TRANSVAGINAL Study Result  Narrative & Impression  CLINICAL DATA:  Pregnant, abdominal pain, slight vaginal bleeding   EXAM: OBSTETRIC <14 WK [place] AND TRANSVAGINAL OB US   TECHNIQUE: Both transabdominal and transvaginal ultrasound examinations were performed for complete evaluation of the gestation as well as the maternal uterus, adnexal regions, and pelvic cul-de-sac. Transvaginal technique was performed to assess early pregnancy.   COMPARISON:  None.   FINDINGS: Intrauterine gestational sac: Single   Yolk sac:  Visualized.   Embryo:  Visualized.   Cardiac Activity: Visualized.   Heart Rate: 111 bpm   CRL:  6.0 mm   6 w   3 d                  [place] EDC: 05/05/2021   Subchorionic hemorrhage:  None visualized.   Maternal uterus/adnexae: Bilateral ovaries are within normal limits, noting a left corpus luteum.   No free fluid.   IMPRESSION: Single live intrauterine gestation, with estimated gestational age [redacted] weeks 3 days by crown-rump length, as above.     Electronically Signed   By: Charline Bills M.D.   On: 09/12/2020 23:35        Assessment:    Pregnancy at [redacted]w[redacted]d weeks    Plan:    1. Supervision of high risk pregnancy, antepartum Rx: - Cytology - PAP( [place]) - Cervicovaginal ancillary only( [place]) - Obstetric Panel, Including HIV - Hepatitis C antibody - Culture, OB Urine  2. Obesity affecting pregnancy, antepartum    Prenatal vitamins.  Counseling provided regarding continued use of seat belts, cessation of alcohol consumption, smoking or use of illicit drugs; infection precautions i.e., influenza/TDAP immunizations, toxoplasmosis,CMV, parvovirus, listeria and varicella; workplace safety, exercise during pregnancy; routine dental care, safe medications, sexual  activity, hot tubs, saunas, pools, travel, caffeine use, fish and methlymercury, potential toxins, hair treatments, varicose veins Weight gain recommendations per IOM guidelines reviewed: underweight/BMI< 18.5--> gain 28 - 40 lbs; normal weight/BMI 18.5 - 24.9--> gain 25 - 35 lbs; overweight/BMI 25 - 29.9--> gain 15 - 25 lbs; obese/BMI >30->gain  11 - 20 lbs Problem list reviewed and updated. FIRST/CF mutation testing/NIPT/QUAD SCREEN/fragile X/Ashkenazi Jewish population testing/Spinal muscular atrophy discussed: requested. Role of ultrasound in pregnancy discussed; fetal survey: requested. Amniocentesis discussed:  not indicated.  Follow up in 4 weeks.  I have spent a total of 20 minutes of face-to-face time, excluding clinical staff time, reviewing notes and preparing to see patient, ordering tests and/or medications, and counseling the patient.   Brock Bad, MD 10/12/2020 3:04 PM

## 2020-10-12 NOTE — Progress Notes (Signed)
Pt states that she has had issues with BP last pregnancy. Pt is concerned today she has been having HA, seeing spots and some LE swelling.

## 2020-10-13 ENCOUNTER — Inpatient Hospital Stay (HOSPITAL_COMMUNITY)
Admission: AD | Admit: 2020-10-13 | Discharge: 2020-10-13 | Disposition: A | Discharge status: Home / Self Care | Payer: Medicaid Other | Attending: Obstetrics and Gynecology | Admitting: Obstetrics and Gynecology

## 2020-10-13 ENCOUNTER — Other Ambulatory Visit: Payer: Self-pay

## 2020-10-13 DIAGNOSIS — Z3A11 11 weeks gestation of pregnancy: Secondary | ICD-10-CM | POA: Diagnosis not present

## 2020-10-13 DIAGNOSIS — Z87891 Personal history of nicotine dependence: Secondary | ICD-10-CM | POA: Diagnosis not present

## 2020-10-13 DIAGNOSIS — O98211 Gonorrhea complicating pregnancy, first trimester: Secondary | ICD-10-CM | POA: Diagnosis not present

## 2020-10-13 DIAGNOSIS — R109 Unspecified abdominal pain: Secondary | ICD-10-CM | POA: Diagnosis present

## 2020-10-13 DIAGNOSIS — Z3491 Encounter for supervision of normal pregnancy, unspecified, first trimester: Secondary | ICD-10-CM

## 2020-10-13 LAB — CERVICOVAGINAL ANCILLARY ONLY
Bacterial Vaginitis (gardnerella): NEGATIVE
Candida Glabrata: NEGATIVE
Candida Vaginitis: NEGATIVE
Chlamydia: NEGATIVE
Comment: NEGATIVE
Comment: NEGATIVE
Comment: NEGATIVE
Comment: NEGATIVE
Comment: NEGATIVE
Comment: NORMAL
Neisseria Gonorrhea: POSITIVE — AB
Trichomonas: NEGATIVE

## 2020-10-13 LAB — CYTOLOGY - PAP: Diagnosis: NEGATIVE

## 2020-10-13 MED ORDER — CEFTRIAXONE SODIUM 500 MG IJ SOLR
500.0000 mg | Freq: Once | INTRAMUSCULAR | Status: AC
  Administered 2020-10-13: 500 mg via INTRAMUSCULAR
  Filled 2020-10-13: qty 500

## 2020-10-13 MED ORDER — LIDOCAINE HCL (PF) 1 % IJ SOLN
1.0000 mL | Freq: Once | INTRAMUSCULAR | Status: AC
  Administered 2020-10-13: 1 mL
  Filled 2020-10-13: qty 5

## 2020-10-13 NOTE — MAU Note (Signed)
..  Colleen Stone is a 25 y.o. at [redacted]w[redacted]d here in MAU reporting: she got a mychart message that stated she was positive for gonorrhea yesterday, but she received treatment on 09/12/2020 but is still having cramping. Wants to know if she needs more treatment or if she needs to do anything about it. Reports yellow vaginal discharge  Pain score: 6/10 Vitals:   10/13/20 2045 10/13/20 2047  BP:  (!) 142/80  Pulse:  98  Resp:  16  Temp:  98.6 F (37 C)  SpO2: 100%      KMM:NOTRRN to doppler. Provider notified

## 2020-10-13 NOTE — MAU Provider Note (Signed)
History     [name]: [id number]  Arrival date and time: 10/13/20 2016   Event Date/Time   First Provider Initiated Contact with Patient 10/13/20 2119      Chief Complaint  Patient presents with   Exposure to STD    Received mychart results   HPI  Colleen Stone is a 25 y.o. G3P1011 at [redacted]w[redacted]d who presents to MAU with chief complaint of abdominal cramping in the setting of positive Gonorrhea swab at [facility] yesterday. Patient has questions regarding how her positive swab is connected to the treatment she received on 09/12/2020. Patient endorses recent re-exposure via unprotected sex.  Patient c/o lower abdominal pain, onset coinciding with her original Gonorrhea diagnosis and treatment on 09/12/2020. Pain score is 6/10. She denies aggravating or alleviating factors. She has not taken medication or tried other treatments for this complaint. She denies vaginal bleeding, dysuria, fever or recent illness.   Patient receives care with [facility].  OB History     Gravida  3   Para  1   Term  1   Preterm      AB  1   Living  1      SAB  1   IAB      Ectopic      Multiple  0   Live Births  1           Past Medical History:  Diagnosis Date   Anxiety    Asthma    Chlamydia    Gonorrhea    Hypertension    takes labetalol   MRSA infection    8th grade   Other acne 06/30/2008   Overview:  Acne   PCOS (polycystic ovarian syndrome)    Seizures (HCC)    Last seizure in 2017   Trichomonas infection     Past Surgical History:  Procedure Laterality Date   ADENOIDECTOMY     NASAL SEPTOPLASTY W/ TURBINOPLASTY     ROOT CANAL     TONSILLECTOMY     WISDOM TOOTH EXTRACTION      Family History  Problem Relation Age of Onset   Asthma Mother    Heart disease Mother        murmur   Hypertension Mother    Heart disease Father     Social History   Tobacco Use   Smoking status: Former    Types: Cigarettes    Quit date: 10/03/2017    Years since quitting: 3.0    Smokeless tobacco: Never  Vaping Use   Vaping Use: Former  Substance Use Topics   Alcohol use: Not Currently    Comment: not since pregnancy   Drug use: Not Currently    Types: Marijuana    Comment: Last marijuana 09/08/20    Allergies:  Allergies  Allergen Reactions   Red Dye Nausea And Vomiting and Other (See Comments)    Her body rejects it   Latex Hives and Swelling    Medications Prior to Admission  Medication Sig Dispense Refill Last Dose   albuterol (PROVENTIL) (2.5 MG/3ML) 0.083% nebulizer solution Take 3 mLs (2.5 mg total) by nebulization every 6 (six) hours as needed for wheezing or shortness of breath. 150 mL 1    albuterol (VENTOLIN HFA) 108 (90 Base) MCG/ACT inhaler Inhale 2 puffs into the lungs every 6 (six) hours as needed for wheezing or shortness of breath. 18 g 4    famotidine (PEPCID) 40 MG/5ML suspension Take 2.5 mLs (20 mg total) by  mouth 2 (two) times daily. (Patient not taking: Reported on 10/05/2020) 90 mL 0    Misc. Devices (GOJJI WEIGHT SCALE) MISC 1 Device by Does not apply route once a week. 1 each 0    ondansetron (ZOFRAN ODT) 8 MG disintegrating tablet Take 1 tablet (8 mg total) by mouth every 8 (eight) hours as needed for nausea or vomiting. 60 tablet 1    Prenatal Vit-Fe Phos-FA-Omega (VITAFOL GUMMIES) 3.33-0.333-34.8 MG CHEW Chew 3 tablets by mouth daily. 90 tablet 11    terconazole (TERAZOL 7) 0.4 % vaginal cream Place 1 applicator vaginally at bedtime. 45 g 0     Review of Systems  Gastrointestinal:  Positive for abdominal pain.  All other systems reviewed and are negative. Physical Exam   Blood pressure (!) 142/80, pulse 98, temperature 98.6 F (37 C), temperature source Oral, resp. rate 16, height 5' 4.5" (1.638 m), weight 133.2 kg, last menstrual period 07/25/2020, SpO2 100 %.  Physical Exam Vitals and nursing note reviewed. Exam conducted with a chaperone present.  Constitutional:      Appearance: Normal appearance. She is obese.   Cardiovascular:     Rate and Rhythm: Normal rate and regular rhythm.     Pulses: Normal pulses.     Heart sounds: Normal heart sounds.  Pulmonary:     Effort: Pulmonary effort is normal.     Breath sounds: Normal breath sounds.  Abdominal:     Tenderness: There is no abdominal tenderness.  Skin:    Capillary Refill: Capillary refill takes less than 2 seconds.  Neurological:     Mental Status: She is alert and oriented to person, place, and time.  Psychiatric:        Mood and Affect: Mood normal.        Behavior: Behavior normal.        Thought Content: Thought content normal.        Judgment: Judgment normal.     MAU Course  Procedures  - IUP confirmed 08/30 - BSUS used to confirm FHT - Advised abstinence until 7 days after all partners have been treated. Condom use for remainder of pregnancy to reduce chances of reinfection  Patient Vitals for the past 24 hrs:  BP Temp Temp src Pulse Resp SpO2 Height Weight  10/13/20 2047 (!) 142/80 98.6 F (37 C) Oral 98 16 -- 5' 4.5" (1.638 m) 133.2 kg  10/13/20 2045 -- -- -- -- -- 100 % -- --   Meds ordered this encounter  Medications   cefTRIAXone (ROCEPHIN) injection 500 mg    Gonorrhea positive yesterday 09/29 in office    Order Specific Question:   Antibiotic Indication:    Answer:   STD   lidocaine (PF) (XYLOCAINE) 1 % injection 1 mL    To dilute with Rocephin IM injection    Assessment and Plan  --25 y.o. G3P1011 at [redacted]w[redacted]d  --FHT 145 via BSUS --Gonorrhea positive yesterday in office --Treated in MAU, tolerated well by patient --Discharge home in stable condition  Calvert Cantor, CNM

## 2020-10-14 ENCOUNTER — Other Ambulatory Visit: Payer: Self-pay | Admitting: Obstetrics

## 2020-10-14 LAB — OBSTETRIC PANEL, INCLUDING HIV
Antibody Screen: NEGATIVE
Basophils Absolute: 0 10*3/uL (ref 0.0–0.2)
Basos: 1 %
EOS (ABSOLUTE): 0.1 10*3/uL (ref 0.0–0.4)
Eos: 1 %
HIV Screen 4th Generation wRfx: NONREACTIVE
Hematocrit: 37.7 % (ref 34.0–46.6)
Hemoglobin: 12.6 g/dL (ref 11.1–15.9)
Hepatitis B Surface Ag: NEGATIVE
Immature Grans (Abs): 0 10*3/uL (ref 0.0–0.1)
Immature Granulocytes: 0 %
Lymphocytes Absolute: 2.6 10*3/uL (ref 0.7–3.1)
Lymphs: 30 %
MCH: 26.2 pg — ABNORMAL LOW (ref 26.6–33.0)
MCHC: 33.4 g/dL (ref 31.5–35.7)
MCV: 78 fL — ABNORMAL LOW (ref 79–97)
Monocytes Absolute: 0.5 10*3/uL (ref 0.1–0.9)
Monocytes: 6 %
Neutrophils Absolute: 5.5 10*3/uL (ref 1.4–7.0)
Neutrophils: 62 %
Platelets: 361 10*3/uL (ref 150–450)
RBC: 4.81 x10E6/uL (ref 3.77–5.28)
RDW: 15.8 % — ABNORMAL HIGH (ref 11.7–15.4)
RPR Ser Ql: NONREACTIVE
Rh Factor: POSITIVE
Rubella Antibodies, IGG: 2.6 index (ref >0.99)
WBC: 8.7 10*3/uL (ref 3.4–10.8)

## 2020-10-14 LAB — HEPATITIS C ANTIBODY: Hep C Virus Ab: <0.1 s/co ratio (ref 0.0–0.9)

## 2020-10-16 LAB — CULTURE, OB URINE

## 2020-10-16 LAB — URINE CULTURE, OB REFLEX

## 2020-10-17 ENCOUNTER — Other Ambulatory Visit: Payer: Self-pay | Admitting: Obstetrics

## 2020-10-17 ENCOUNTER — Telehealth: Payer: Self-pay

## 2020-10-17 DIAGNOSIS — O2341 Unspecified infection of urinary tract in pregnancy, first trimester: Secondary | ICD-10-CM

## 2020-10-17 DIAGNOSIS — I10 Essential (primary) hypertension: Secondary | ICD-10-CM

## 2020-10-17 MED ORDER — LABETALOL HCL 100 MG PO TABS: 100.0000 mg | ORAL_TABLET | Freq: Two times a day (BID) | ORAL | 0 refills | Status: DC

## 2020-10-17 MED ORDER — CEFUROXIME AXETIL 500 MG PO TABS: 500.0000 mg | ORAL_TABLET | Freq: Two times a day (BID) | ORAL | 0 refills | Status: DC

## 2020-10-17 NOTE — Telephone Encounter (Signed)
Pt called stating that she had elevated blood pressure reading last night (141/103) and just a few minutes ago (144/100), Pt does state that she has been previously diagnosed with HTN but is not currently on any medication. She also states that she has had HA for the last 2 days along with nausea. She does have a hx of migraines as well. Since patient is 12 weeks, after speaking with Dr. Donavan Foil, will start patient on labetalol 100mg  BID. Pt was informed and advised to begin medication as soon as possible. Also advised patient to use tylenol for HA and to drink plenty of water. Monitor BP. Pt agreed and verbalized understanding.

## 2020-11-09 ENCOUNTER — Ambulatory Visit (INDEPENDENT_AMBULATORY_CARE_PROVIDER_SITE_OTHER): Payer: Medicaid Other | Admitting: Advanced Practice Midwife

## 2020-11-09 ENCOUNTER — Other Ambulatory Visit (HOSPITAL_COMMUNITY)
Admission: RE | Admit: 2020-11-09 | Discharge: 2020-11-09 | Disposition: A | Discharge status: Home / Self Care | Payer: Medicaid Other | Source: Ambulatory Visit | Attending: Advanced Practice Midwife | Admitting: Advanced Practice Midwife

## 2020-11-09 ENCOUNTER — Other Ambulatory Visit: Payer: Self-pay

## 2020-11-09 VITALS — BP 120/81 | HR 107 | Wt 291.0 lb

## 2020-11-09 DIAGNOSIS — O10919 Unspecified pre-existing hypertension complicating pregnancy, unspecified trimester: Secondary | ICD-10-CM

## 2020-11-09 DIAGNOSIS — O0990 Supervision of high risk pregnancy, unspecified, unspecified trimester: Secondary | ICD-10-CM | POA: Insufficient documentation

## 2020-11-09 DIAGNOSIS — R102 Pelvic and perineal pain: Secondary | ICD-10-CM

## 2020-11-09 DIAGNOSIS — G40909 Epilepsy, unspecified, not intractable, without status epilepticus: Secondary | ICD-10-CM

## 2020-11-09 DIAGNOSIS — O26892 Other specified pregnancy related conditions, second trimester: Secondary | ICD-10-CM

## 2020-11-09 DIAGNOSIS — R12 Heartburn: Secondary | ICD-10-CM

## 2020-11-09 DIAGNOSIS — O98211 Gonorrhea complicating pregnancy, first trimester: Secondary | ICD-10-CM

## 2020-11-09 DIAGNOSIS — K219 Gastro-esophageal reflux disease without esophagitis: Secondary | ICD-10-CM

## 2020-11-09 MED ORDER — FAMOTIDINE 20 MG PO TABS: 20.0000 mg | ORAL_TABLET | Freq: Two times a day (BID) | ORAL | 2 refills | Status: DC | PRN

## 2020-11-09 MED ORDER — PANTOPRAZOLE SODIUM 40 MG PO TBEC: 40.0000 mg | DELAYED_RELEASE_TABLET | Freq: Every day | ORAL | 5 refills | Status: DC

## 2020-11-09 NOTE — Progress Notes (Signed)
Pt states that she has mild cramping and some pelvic pressure. Pt also complains of reflux.

## 2020-11-09 NOTE — Progress Notes (Signed)
PRENATAL VISIT NOTE  Subjective:  Colleen Stone is a 25 y.o. G3P1011 at [redacted]w[redacted]d being seen today for ongoing prenatal care.  She is currently monitored for the following issues for this high-risk pregnancy and has Bipolar 1 disorder (HCC); Family history of breast cancer; Family history of schizophrenia; Smoker; History of seizure disorder; Hypertension; Atypical squamous cells of undetermined significance (ASCUS) on Papanicolaou smear of cervix; Gonorrhea affecting pregnancy in first trimester; and Supervision of high risk pregnancy, antepartum on their problem list.  Patient reports no complaints.  Contractions: Not present.  .  Movement: Present. Denies leaking of fluid.   The following portions of the patient's history were reviewed and updated as appropriate: allergies, current medications, past family history, past medical history, past social history, past surgical history and problem list.   Objective:   Vitals:   11/09/20 0941  BP: 120/81  Pulse: (!) 107  Weight: 291 lb (132 kg)    Fetal Status:     Movement: Present     General:  Alert, oriented and cooperative. Patient is in no acute distress.  Skin: Skin is warm and dry. No rash noted.   Cardiovascular: Normal heart rate noted  Respiratory: Normal respiratory effort, no problems with respiration noted  Abdomen: Soft, gravid, appropriate for gestational age.  Pain/Pressure: Present     Pelvic: Cervical exam deferred        Extremities: Normal range of motion.     Mental Status: Normal mood and affect. Normal behavior. Normal judgment and thought content.   Assessment and Plan:  Pregnancy: G3P1011 at [redacted]w[redacted]d 1. Supervision of high risk pregnancy, antepartum --Anticipatory guidance about next visits/weeks of pregnancy given. --Next visit in 2 weeks for medical follow up  - Cervicovaginal ancillary only( [place]) - AFP, Serum, Open Spina Bifida - Genetic Screening  2. Heartburn during pregnancy in second  trimester --See below - famotidine (PEPCID) 20 MG tablet; Take 1 tablet (20 mg total) by mouth 2 (two) times daily as needed for heartburn or indigestion.  Dispense: 30 tablet; Refill: 2  3. Gonorrhea affecting pregnancy in first trimester --TOC today  1. Supervision of high risk pregnancy, antepartum  - Cervicovaginal ancillary only( [place]) - AFP, Serum, Open Spina Bifida - Genetic Screening  2. Heartburn during pregnancy in second trimester --Pepcid and Tums have been ineffective. Pt with GI issues prior to pregnancy.  --Daily PPI with Protonix rx, Pepcid occasionally for breakthrough  - famotidine (PEPCID) 20 MG tablet; Take 1 tablet (20 mg total) by mouth 2 (two) times daily as needed for heartburn or indigestion.  Dispense: 30 tablet; Refill: 2  3. Gonorrhea affecting pregnancy in first trimester --TOC today  4. Pelvic pain affecting pregnancy in second trimester, antepartum  - Culture, OB Urine  5. Chronic hypertension affecting pregnancy --On labetalol  6. Seizure disorder (HCC) --Pt does not take meds in pregnancy. Did not take with last pregnancy. Last seizure a few months ago.   --Pt to f/u with MD in 2 weeks, discuss antiseizure medication risks vs seizure risks in pregnancy --Has neurologist but not in [place], may need new referral   Preterm labor symptoms and general obstetric precautions including but not limited to vaginal bleeding, contractions, leaking of fluid and fetal movement were reviewed in detail with the patient. Please refer to After Visit Summary for other counseling recommendations.   Return in about 2 weeks (around 11/23/2020).  Future Appointments  Date Time Provider Department Center  11/22/2020  9:55 AM Scheryl Darter  G, MD [facility] None    Sharen Counter, CNM

## 2020-11-10 ENCOUNTER — Other Ambulatory Visit: Payer: Self-pay | Admitting: Advanced Practice Midwife

## 2020-11-10 ENCOUNTER — Telehealth: Payer: Self-pay | Admitting: *Deleted

## 2020-11-10 MED ORDER — BUTALBITAL-APAP-CAFFEINE 50-325-40 MG PO TABS: 1.0000 | ORAL_TABLET | Freq: Four times a day (QID) | ORAL | 0 refills | Status: DC | PRN

## 2020-11-10 NOTE — Telephone Encounter (Signed)
Pt called to office stating she was get Rx for HA/migraines.  Review of meds, no Rx sent. Pt made aware message to be sent to provider.     Please review and send Rx if approved. [facility] on [address]

## 2020-11-11 LAB — AFP, SERUM, OPEN SPINA BIFIDA
AFP MoM: 0.83
AFP Value: 20.2 ng/mL
Gest. Age on Collection Date: 15.4 weeks
Maternal Age At EDD: 27.1 yr
OSBR Risk 1 IN: 10000
Test Results:: NEGATIVE
Weight: 291 [lb_av]

## 2020-11-11 LAB — URINE CULTURE, OB REFLEX

## 2020-11-11 LAB — CULTURE, OB URINE

## 2020-11-13 LAB — CERVICOVAGINAL ANCILLARY ONLY
Chlamydia: NEGATIVE
Comment: NEGATIVE
Comment: NEGATIVE
Comment: NORMAL
Neisseria Gonorrhea: NEGATIVE
Trichomonas: NEGATIVE

## 2020-11-16 ENCOUNTER — Encounter: Payer: Self-pay | Admitting: Advanced Practice Midwife

## 2020-11-20 ENCOUNTER — Other Ambulatory Visit: Payer: Self-pay | Admitting: Obstetrics and Gynecology

## 2020-11-20 DIAGNOSIS — I10 Essential (primary) hypertension: Secondary | ICD-10-CM

## 2020-11-22 ENCOUNTER — Encounter: Payer: Medicaid Other | Admitting: Obstetrics & Gynecology

## 2020-11-24 ENCOUNTER — Ambulatory Visit
Admission: RE | Admit: 2020-11-24 | Discharge: 2020-11-24 | Disposition: A | Discharge status: Home / Self Care | Payer: Medicaid Other | Source: Ambulatory Visit | Attending: Internal Medicine | Admitting: Internal Medicine

## 2020-11-24 ENCOUNTER — Other Ambulatory Visit: Payer: Self-pay

## 2020-11-24 VITALS — BP 122/79 | HR 96 | Temp 98.1°F | Resp 16

## 2020-11-24 DIAGNOSIS — N760 Acute vaginitis: Secondary | ICD-10-CM

## 2020-11-24 NOTE — ED Triage Notes (Signed)
Vaginal irritation and discharge x 3 days. 17-[redacted] weeks pregnant. Requesting STD/BV/Yeast testing

## 2020-11-24 NOTE — ED Provider Notes (Signed)
[name]    [name]: [id number] Arrival date & time: 11/24/20  1019      History   Chief Complaint Chief Complaint  Patient presents with  . SEXUALLY TRANSMITTED DISEASE    HPI Colleen Stone is a 25 y.o. female currently [redacted] weeks pregnant comes to the urgent care with vaginal irritation and yellowish discharge of 3 days duration.  Patient says symptoms started insidiously and has been persistent.  No fever or chills.  She has superficial and deep dyspareunia.  No nausea or vomiting.  No vaginal bleeding or abdominal pain.  Patient has 1 sexual partner.   HPI  Past Medical History:  Diagnosis Date  . Anxiety   . Asthma   . Chlamydia   . Gonorrhea   . Hypertension    takes labetalol  . MRSA infection    8th grade  . Other acne 06/30/2008   Overview:  Acne  . PCOS (polycystic ovarian syndrome)   . Seizures (HCC)    Last seizure in 2017  . Trichomonas infection     Patient Active Problem List   Diagnosis Date Noted  . Supervision of high risk pregnancy, antepartum 10/05/2020  . Gonorrhea affecting pregnancy in first trimester 09/13/2020  . Hypertension   . History of seizure disorder 06/15/2018  . Bipolar 1 disorder (HCC) 01/13/2018  . Family history of schizophrenia 01/13/2018  . Smoker 01/13/2018  . Atypical squamous cells of undetermined significance (ASCUS) on Papanicolaou smear of cervix 01/26/2013  . Family history of breast cancer 08/31/2012    Past Surgical History:  Procedure Laterality Date  . ADENOIDECTOMY    . NASAL SEPTOPLASTY W/ TURBINOPLASTY    . ROOT CANAL    . TONSILLECTOMY    . WISDOM TOOTH EXTRACTION      OB History     Gravida  3   Para  1   Term  1   Preterm      AB  1   Living  1      SAB  1   IAB      Ectopic      Multiple  0   Live Births  1            Home Medications    Prior to Admission medications   Medication Sig Start Date End Date Taking? Authorizing Provider   butalbital-acetaminophen-caffeine (FIORICET) 50-325-40 MG tablet Take 1-2 tablets by mouth every 6 (six) hours as needed for headache. 11/10/20 11/10/21  Leftwich-Kirby, Wilmer Floor, CNM  albuterol (PROVENTIL) (2.5 MG/3ML) 0.083% nebulizer solution Take 3 mLs (2.5 mg total) by nebulization every 6 (six) hours as needed for wheezing or shortness of breath. 08/05/18   Fulp, Cammie, MD  albuterol (VENTOLIN HFA) 108 (90 Base) MCG/ACT inhaler Inhale 2 puffs into the lungs every 6 (six) hours as needed for wheezing or shortness of breath. 08/05/18   Fulp, Cammie, MD  famotidine (PEPCID) 20 MG tablet Take 1 tablet (20 mg total) by mouth 2 (two) times daily as needed for heartburn or indigestion. 11/09/20   Leftwich-Kirby, Wilmer Floor, CNM  labetalol (NORMODYNE) 100 MG tablet TAKE 1 TABLET(100 MG) BY MOUTH TWICE DAILY 11/20/20   Warden Fillers, MD  Misc. Devices (GOJJI WEIGHT SCALE) MISC 1 Device by Does not apply route once a week. 10/05/20   Adam Phenix, MD  ondansetron (ZOFRAN ODT) 8 MG disintegrating tablet Take 1 tablet (8 mg total) by mouth every 8 (eight) hours as needed for nausea or  vomiting. 09/09/20   Marylene Land, CNM  pantoprazole (PROTONIX) 40 MG tablet Take 1 tablet (40 mg total) by mouth daily. 11/09/20   Leftwich-Kirby, Wilmer Floor, CNM  Prenatal Vit-Fe Phos-FA-Omega (VITAFOL GUMMIES) 3.33-0.333-34.8 MG CHEW Chew 3 tablets by mouth daily. 10/05/20   Adam Phenix, MD  terconazole (TERAZOL 7) 0.4 % vaginal cream Place 1 applicator vaginally at bedtime. 09/29/20   Cresenzo-Dishmon, Scarlette Calico, CNM  cetirizine (ZYRTEC ALLERGY) 10 MG tablet Take 1 tablet (10 mg total) by mouth 2 (two) times daily. 06/21/19 06/25/19  Muthersbaugh, Dahlia Client, PA-C    Family History Family History  Problem Relation Age of Onset  . Asthma Mother   . Heart disease Mother        murmur  . Hypertension Mother   . Heart disease Father     Social History Social History   Tobacco Use  . Smoking status: Former     Types: Cigarettes    Quit date: 10/03/2017    Years since quitting: 3.1  . Smokeless tobacco: Never  Vaping Use  . Vaping Use: Former  Substance Use Topics  . Alcohol use: Not Currently    Comment: not since pregnancy  . Drug use: Not Currently    Types: Marijuana    Comment: Last marijuana 09/08/20     Allergies   Red dye and Latex   Review of Systems Review of Systems  Gastrointestinal: Negative.  Negative for abdominal pain.  Genitourinary:  Positive for dyspareunia and vaginal discharge. Negative for dysuria, frequency, urgency, vaginal bleeding and vaginal pain.    Physical Exam Triage Vital Signs ED Triage Vitals  Enc Vitals Group     BP 11/24/20 1037 122/79     Pulse Rate 11/24/20 1037 96     Resp 11/24/20 1037 16     Temp 11/24/20 1037 98.1 F (36.7 C)     Temp Source 11/24/20 1037 Oral     SpO2 11/24/20 1037 98 %     Weight --      Height --      Head Circumference --      Peak Flow --      Pain Score 11/24/20 1119 0     Pain Loc --      Pain Edu? --      Excl. in GC? --    No data found.  Updated Vital Signs BP 122/79 (BP Location: Right Arm)   Pulse 96   Temp 98.1 F (36.7 C) (Oral)   Resp 16   LMP 07/25/2020   SpO2 98%   Visual Acuity Right Eye Distance:   Left Eye Distance:   Bilateral Distance:    Right Eye Near:   Left Eye Near:    Bilateral Near:     Physical Exam Vitals and nursing note reviewed.  Constitutional:      General: She is not in acute distress.    Appearance: She is not ill-appearing.  Cardiovascular:     Rate and Rhythm: Normal rate and regular rhythm.     Pulses: Normal pulses.     Heart sounds: Normal heart sounds.  Pulmonary:     Effort: Pulmonary effort is normal.     Breath sounds: Normal breath sounds.  Abdominal:     General: Bowel sounds are normal.     Palpations: Abdomen is soft.  Neurological:     Mental Status: She is alert.     UC Treatments / Results  Labs (all labs ordered are listed,  but  only abnormal results are displayed) Labs Reviewed  CERVICOVAGINAL ANCILLARY ONLY    EKG   Radiology No results found.  Procedures Procedures (including critical care time)  Medications Ordered in UC Medications - No data to display  Initial Impression / Assessment and Plan / UC Course  I have reviewed the triage vital signs and the nursing notes.  Pertinent labs & imaging results that were available during my care of the patient were reviewed by me and considered in my medical decision making (see chart for details).     1.  Acute vaginitis: Cervical vaginal swab for GC/chlamydia/bacterial vaginosis/vaginal yeast We will call you with recommendations if labs are abnormal Abstain from sexual intercourse until lab results are available. Final Clinical Impressions(s) / UC Diagnoses   Final diagnoses:  Acute vaginitis     Discharge Instructions      We will call you with recommendations if labs are abnormal Return to urgent care if you have worsening symptoms. Avoid sexual intercourse until we get the lab results back.   ED Prescriptions   None    PDMP not reviewed this encounter.   Merrilee Jansky, MD 11/24/20 1124

## 2020-11-24 NOTE — Discharge Instructions (Signed)
We will call you with recommendations if labs are abnormal Return to urgent care if you have worsening symptoms. Avoid sexual intercourse until we get the lab results back.

## 2020-11-27 ENCOUNTER — Telehealth (HOSPITAL_COMMUNITY): Payer: Self-pay | Admitting: Emergency Medicine

## 2020-11-27 ENCOUNTER — Encounter: Payer: Self-pay | Admitting: Advanced Practice Midwife

## 2020-11-27 LAB — CERVICOVAGINAL ANCILLARY ONLY
Bacterial Vaginitis (gardnerella): NEGATIVE
Candida Glabrata: NEGATIVE
Candida Vaginitis: NEGATIVE
Chlamydia: NEGATIVE
Comment: NEGATIVE
Comment: NEGATIVE
Comment: NEGATIVE
Comment: NEGATIVE
Comment: NEGATIVE
Comment: NORMAL
Neisseria Gonorrhea: POSITIVE — AB
Trichomonas: NEGATIVE

## 2020-11-28 ENCOUNTER — Ambulatory Visit (INDEPENDENT_AMBULATORY_CARE_PROVIDER_SITE_OTHER): Payer: Medicaid Other | Admitting: Obstetrics and Gynecology

## 2020-11-28 ENCOUNTER — Other Ambulatory Visit: Payer: Self-pay

## 2020-11-28 ENCOUNTER — Encounter: Payer: Self-pay | Admitting: Obstetrics and Gynecology

## 2020-11-28 VITALS — BP 125/84 | HR 93 | Wt 292.5 lb

## 2020-11-28 DIAGNOSIS — I10 Essential (primary) hypertension: Secondary | ICD-10-CM

## 2020-11-28 DIAGNOSIS — O98212 Gonorrhea complicating pregnancy, second trimester: Secondary | ICD-10-CM | POA: Diagnosis not present

## 2020-11-28 DIAGNOSIS — O0990 Supervision of high risk pregnancy, unspecified, unspecified trimester: Secondary | ICD-10-CM

## 2020-11-28 DIAGNOSIS — R8271 Bacteriuria: Secondary | ICD-10-CM | POA: Insufficient documentation

## 2020-11-28 DIAGNOSIS — D563 Thalassemia minor: Secondary | ICD-10-CM

## 2020-11-28 DIAGNOSIS — Z3A18 18 weeks gestation of pregnancy: Secondary | ICD-10-CM

## 2020-11-28 DIAGNOSIS — O98211 Gonorrhea complicating pregnancy, first trimester: Secondary | ICD-10-CM

## 2020-11-28 MED ORDER — ASPIRIN EC 81 MG PO TBEC: 81.0000 mg | DELAYED_RELEASE_TABLET | Freq: Every day | ORAL | 2 refills | Status: DC

## 2020-11-28 MED ORDER — CEFTRIAXONE SODIUM 500 MG IJ SOLR
500.0000 mg | Freq: Once | INTRAMUSCULAR | Status: AC
  Administered 2020-11-28: 500 mg via INTRAMUSCULAR

## 2020-11-28 NOTE — Progress Notes (Signed)
   PRENATAL VISIT NOTE  Subjective:  Colleen Stone is a 25 y.o. G3P1011 at [redacted]w[redacted]d being seen today for ongoing prenatal care.  She is currently monitored for the following issues for this high-risk pregnancy and has Bipolar 1 disorder (HCC); Family history of breast cancer; Family history of schizophrenia; Smoker; History of seizure disorder; Hypertension; Atypical squamous cells of undetermined significance (ASCUS) on Papanicolaou smear of cervix; Gonorrhea affecting pregnancy in first trimester; Supervision of high risk pregnancy, antepartum; Group B streptococcal bacteriuria; and Alpha thalassemia silent carrier on their problem list.  Patient reports no complaints.  Contractions: Not present. Vag. Bleeding: None.  Movement: Present. Denies leaking of fluid.   The following portions of the patient's history were reviewed and updated as appropriate: allergies, current medications, past family history, past medical history, past social history, past surgical history and problem list.   Objective:   Vitals:   11/28/20 0939  BP: 125/84  Pulse: 93  Weight: 292 lb 8 oz (132.7 kg)    Fetal Status: Fetal Heart Rate (bpm): 138   Movement: Present     General:  Alert, oriented and cooperative. Patient is in no acute distress.  Skin: Skin is warm and dry. No rash noted.   Cardiovascular: Normal heart rate noted  Respiratory: Normal respiratory effort, no problems with respiration noted  Abdomen: Soft, gravid, appropriate for gestational age.  Pain/Pressure: Present     Pelvic: Cervical exam deferred        Extremities: Normal range of motion.     Mental Status: Normal mood and affect. Normal behavior. Normal judgment and thought content.   Assessment and Plan:  Pregnancy: G3P1011 at [redacted]w[redacted]d  1. Supervision of high risk pregnancy, antepartum  2. Gonorrhea affecting pregnancy in first trimester 3rd time to be positive since August Patient states partner told her he had been treated but  she thinks he was not truthful, she has cut ties with him Rocephin 500 mg IM today  3. Group B streptococcal bacteriuria Ppx in labor  4. Hypertension, unspecified type Takes 100 mg BID labetalol Normotensive today Start baby aspirin  5. Alpha thalassemia silent carrier Reviewed implications   Preterm labor symptoms and general obstetric precautions including but not limited to vaginal bleeding, contractions, leaking of fluid and fetal movement were reviewed in detail with the patient. Please refer to After Visit Summary for other counseling recommendations.   Return in about 4 weeks (around 12/26/2020) for high OB, in person.  Future Appointments  Date Time Provider Department Center  12/26/2020  9:35 AM Warden Fillers, MD [facility] None    Conan Bowens, MD

## 2020-11-28 NOTE — Progress Notes (Signed)
ROB 18 wks +Gonorrhea, needs Rocephin today, partner notified, not involved any longer, he refuses treatment. Wants to discuss silent alpha thal carrier status.

## 2020-12-19 ENCOUNTER — Ambulatory Visit (HOSPITAL_BASED_OUTPATIENT_CLINIC_OR_DEPARTMENT_OTHER): Payer: Medicaid Other | Admitting: Obstetrics and Gynecology

## 2020-12-19 ENCOUNTER — Ambulatory Visit: Payer: Medicaid Other | Attending: Obstetrics and Gynecology

## 2020-12-19 ENCOUNTER — Other Ambulatory Visit: Payer: Self-pay | Admitting: *Deleted

## 2020-12-19 ENCOUNTER — Ambulatory Visit: Payer: Medicaid Other | Admitting: *Deleted

## 2020-12-19 ENCOUNTER — Other Ambulatory Visit: Payer: Self-pay

## 2020-12-19 ENCOUNTER — Ambulatory Visit (HOSPITAL_BASED_OUTPATIENT_CLINIC_OR_DEPARTMENT_OTHER): Payer: Medicaid Other

## 2020-12-19 VITALS — BP 129/93 | HR 113

## 2020-12-19 DIAGNOSIS — D563 Thalassemia minor: Secondary | ICD-10-CM | POA: Insufficient documentation

## 2020-12-19 DIAGNOSIS — O99352 Diseases of the nervous system complicating pregnancy, second trimester: Secondary | ICD-10-CM | POA: Diagnosis present

## 2020-12-19 DIAGNOSIS — Z3A21 21 weeks gestation of pregnancy: Secondary | ICD-10-CM | POA: Diagnosis present

## 2020-12-19 DIAGNOSIS — O10012 Pre-existing essential hypertension complicating pregnancy, second trimester: Secondary | ICD-10-CM

## 2020-12-19 DIAGNOSIS — O99212 Obesity complicating pregnancy, second trimester: Secondary | ICD-10-CM | POA: Insufficient documentation

## 2020-12-19 DIAGNOSIS — O283 Abnormal ultrasonic finding on antenatal screening of mother: Secondary | ICD-10-CM | POA: Insufficient documentation

## 2020-12-19 DIAGNOSIS — O0990 Supervision of high risk pregnancy, unspecified, unspecified trimester: Secondary | ICD-10-CM | POA: Diagnosis present

## 2020-12-19 DIAGNOSIS — G40909 Epilepsy, unspecified, not intractable, without status epilepticus: Secondary | ICD-10-CM

## 2020-12-19 DIAGNOSIS — Z362 Encounter for other antenatal screening follow-up: Secondary | ICD-10-CM

## 2020-12-19 NOTE — Progress Notes (Signed)
Name: Colleen Stone Indication:  Maternal silent carrier for alpha thalassemia Echogenic intracardiac focus  DOB: 04-May-1995 Age: 25 y.o.   EDC: 05/05/2021 LMP: 07/25/2020 Referring Provider:  Sloan Leiter, MD   EGA: [id number] Counselor: Colleen Stone  OB Hx:: [id number] of Appointment: 12/19/2020  Accompanied by: Colleen LineaFace to Face Time: 40 Minutes   Previous Testing Completed: Colleen Stone previously completed Non-Invasive Prenatal Screening (NIPS) in this pregnancy (scanned into Epic under the Media tab). The result is low risk, consistent with a female fetus. This screening significantly reduces the risk that the current pregnancy has Down syndrome, Trisomy 162 Trisomy 13, Monosomy X, and Triploidy, however, the risk is not zero given the limitations of NIPS. Additionally, there are many genetic conditions that cannot be detected by NIPS.  Colleen Stone previously completed carrier screening (scanned into Epic under the Media tab). She screened to be a silent carrier for alpha thalassemia. She screened to not be a carrier for Cystic Fibrosis (CF), Spinal Muscular Atrophy (SMA), and beta hemoglobinopathies. A negative result on carrier screening reduces the likelihood of being a carrier, however, does not entirely rule out the possibility. Colleen Stone previously completed a maternal serum AFP screen in this pregnancy. The result is screen negative. A negative result reduces the risk that the current pregnancy has an open neural tube defect.   Medical History:  This is Colleen Stone's 3rd pregnancy. She has one living, healthy daughter from a previous partner. She has had one early spontaneous loss. Personal history of hypertension and epilepsy. Reports her last seizure was before she knew she was pregnant in August or September. Reports she is not currently taking any medications for her epilepsy.  Denies personal history of diabetes and thyroid conditions. Denies bleeding and  fevers in this pregnancy.   Family History: A pedigree was created and scanned into Epic under the Media tab. Colleen Stone her maternal half brother has sickle cell trait. She reports her paternal uncle is affected with sickle cell disease. Colleen Stone screened to not be a carrier of sickle cell trait and other Beta-Hemoglobinopathies (scanned into Epic under Media tab) Maternal ethnicity reported as [place] and paternal ethnicity reported as Hispanic. Family history not remarkable for consanguinity, individuals with birth defects, intellectual disability, autism spectrum disorder, multiple spontaneous abortions, still births, or unexplained neonatal death.     Genetic Counseling:   Silent Carrier for Alpha Thalassemia. Colleen Stone a silent carrier for alpha thalassemia (??/?-). Positive for the pathogenic alpha 3.7 deletion of the HBA2 gene. With Colleen Stone's alpha thalassemia screening result, we know that she has three working copies of the alpha-globin genes while the 4th alpha-globin gene is deleted. Each of Colleen Stone will either inherit two functional copies or one functional copy with one deletion from her. Colleen Stone not at an increased risk to have a baby with fetal hydrops due to Hemoglobin Barts disease (--/--) regardless of her reproductive partner's carrier status. We discussed there would be a 25% risk for the current pregnancy to be affected with Hemoglobin H disease (--/?-) if Colleen Stone found to be an alpha thalassemia carrier in the cis configuration (??/--). Clinical features of Hemoglobin H disease are highly variable and generally develop in the first years of life. The primary symptoms include moderate anemia with marked microcytosis, jaundice, and hepatosplenomegaly. Some affected individuals do not require blood transfusions while others may require occasional blood transfusions throughout their lifetime. Because Colleen Stone a silent  carrier for alpha  thalassemia (??/?-), carrier screening for her reproductive partner is recommended to determine risk for the current pregnancy. If Colleen Stone is found to be a non-carrier (??/??), a silent carrier (??/?-), or a carrier in the trans configuration (?-/?-) there would not be an increased risk for the pregnancy to have Hemoglobin H disease. Colleen Stone accepted carrier screening on her partner's behalf. We provided Colleen Stone a Colleen Stone saliva kit and test requisition for Colleen Stone to complete at his earliest convenience. Results will take approximately 2-3 weeks from when Colleen Stone receives his saliva sample.   Echogenic Intracardiac Focus (EIF). Echogenic Intracardiac Focus (EIF) represents mineralization deposits of calcium in the myocardium, typically in the left ventricle, however, can also occur in the right ventricle or bilaterally. EIF is an isolated finding in anywhere from 3-20% of pregnancies and does not indicate underlying structural abnormalities or disrupt the function of the heart. The frequency of EIF has been reported as 3.5-10.5% in Caucasian patients, 10.3-30.4% in Asian patients, and 5.5-5.9% in African American patients. EIF is a soft marker for Down syndrome. Colleen Stone previously completed NIPS in this pregnancy. The result is low risk. This screening significantly reduces the risk that the current pregnancy has Down syndrome, Trisomy 46, Trisomy 45, and common sex chromosome conditions, however, the risk is not zero given the limitations of NIPS. Additionally, there are many genetic conditions that cannot be detected by NIPS.  Birth Defects. All babies have approximately a 3-5% risk for a birth defect and a majority of these defects cannot be detected through the screening or diagnostic testing listed below. Ultrasound may detect some birth defects, but it may not detect all birth defects. About half of pregnancies with Down syndrome do not show any soft markers on ultrasound. A normal ultrasound  does not guarantee a healthy pregnancy.    Patient Plan:  Proceed with: Carrier screening for alpha thalassemia for her partner, [facility] Informed consent was obtained. All questions were answered.    Thank you for sharing in the care of Jansen with [place].  Please do not hesitate to contact us if you have any questions.  Staci Righter, MS, [facility]

## 2020-12-19 NOTE — Progress Notes (Unsigned)
Us/

## 2020-12-19 NOTE — Progress Notes (Signed)
Maternal-Fetal Medicine   Name: Colleen Stone DOB: 09/01/1995 MRN: [id number] Referring Provider: Baltazar Najjar, MD  I had the pleasure of seeing Ms. Cartwright today at the [facility]. She is G3 P1011 at 21-weeks' gestation and is here for fetal anatomy scan.  Obstetrical history significant for a term vaginal delivery in 2020 of a female infant weighing 7 pounds 7 ounces at birth. Patient has a diagnosis of chronic hypertension and takes labetalol.  Blood pressure today at her office is 123/93 mmHg.  Patient takes low-dose aspirin prophylaxis. On cell free fetal DNA screening, the risks of fetal aneuploidies are not increased.  MSAFP screening showed low risk for open neural tube defects.  On carrier screening, she is a silent carrier for alpha thalassemia.  Ultrasound We performed fetal anatomical survey.  Amniotic fluid is normal and good fetal activity seen.  An echogenic intracardiac focus is seen.  No other markers of aneuploidies or fetal structural defects are seen.  Fetal biometry is consistent with the previously established dates.  Our concerns include Echogenic intracardiac focus It is seen in about 3% to 4% of normal fetuses.  It is a marker for Down syndrome.  However, given that she had low risk for fetal Down syndrome on cell free fetal DNA screening, this should be considered a normal variant.  I do not recommend amniocentesis to determine if the fetus has Down syndrome.  Chronic hypertension -Adverse outcomes of severe chronic hypertension include maternal stroke, endorgan damage, coagulation disturbances and placental abruption.  Superimposed preeclampsia occurs in 30% of women with chronic hypertension.  I discussed the benefit of low-dose aspirin prophylaxis that helps delaying or preventing preeclampsia. - Labetalol can be safely given in pregnancy.  It can be associated with low birthweights.  Alternative medications include nifedipine. -I  discussed ultrasound protocol of monitoring fetal growth assessment and antenatal testing. -Timing of delivery: Provided her blood pressures are well controlled, she can be delivered at 39 weeks' gestation.  Early term delivery is an option if hypertension is not well controlled.  Silent alpha thalassemia carrier I recommended genetic counseling.  Patient met with our genetic counselor today.  She had opted for partner screening.  See separate letter from our genetic counselor.  Later, the patient reported she had seizures before pregnancy.  I had not addressed her history of epilepsy today.    Recommendations -An appointment was made for her to return in 4 weeks for completion of fetal anatomy. -Fetal growth assessments every 4 weeks. -Weekly BPP from [redacted] weeks gestation till delivery. -Neurology consultation if patient has persistent seizures.  Thank you for consultation.  If you have any questions or concerns, please contact me the [facility].  Consultation including face-to-face (more than 50%) counseling 30 minutes.

## 2020-12-26 ENCOUNTER — Ambulatory Visit (INDEPENDENT_AMBULATORY_CARE_PROVIDER_SITE_OTHER): Payer: Medicaid Other | Admitting: Obstetrics and Gynecology

## 2020-12-26 ENCOUNTER — Other Ambulatory Visit (HOSPITAL_COMMUNITY)
Admission: RE | Admit: 2020-12-26 | Discharge: 2020-12-26 | Disposition: A | Discharge status: Home / Self Care | Payer: Medicaid Other | Source: Ambulatory Visit | Attending: Obstetrics and Gynecology | Admitting: Obstetrics and Gynecology

## 2020-12-26 ENCOUNTER — Other Ambulatory Visit: Payer: Self-pay

## 2020-12-26 ENCOUNTER — Ambulatory Visit: Payer: Self-pay

## 2020-12-26 VITALS — BP 116/82 | HR 92 | Wt 290.3 lb

## 2020-12-26 DIAGNOSIS — I10 Essential (primary) hypertension: Secondary | ICD-10-CM

## 2020-12-26 DIAGNOSIS — O98211 Gonorrhea complicating pregnancy, first trimester: Secondary | ICD-10-CM

## 2020-12-26 DIAGNOSIS — O0990 Supervision of high risk pregnancy, unspecified, unspecified trimester: Secondary | ICD-10-CM

## 2020-12-26 DIAGNOSIS — Z6841 Body Mass Index (BMI) 40.0 and over, adult: Secondary | ICD-10-CM | POA: Insufficient documentation

## 2020-12-26 DIAGNOSIS — D563 Thalassemia minor: Secondary | ICD-10-CM

## 2020-12-26 DIAGNOSIS — O10912 Unspecified pre-existing hypertension complicating pregnancy, second trimester: Secondary | ICD-10-CM

## 2020-12-26 DIAGNOSIS — Z3A22 22 weeks gestation of pregnancy: Secondary | ICD-10-CM | POA: Insufficient documentation

## 2020-12-26 DIAGNOSIS — R8271 Bacteriuria: Secondary | ICD-10-CM

## 2020-12-26 NOTE — Progress Notes (Signed)
Patient presents for ROB. Patient states that she did have some light spotting 2 days ago with some mild cramping. She states that she has not had any since then. Patient has no other concerns today.

## 2020-12-26 NOTE — Progress Notes (Signed)
° °  PRENATAL VISIT NOTE  Subjective:  Colleen Stone is a 25 y.o. G3P1011 at [redacted]w[redacted]d being seen today for ongoing prenatal care.  She is currently monitored for the following issues for this high-risk pregnancy and has Bipolar 1 disorder (HCC); Family history of breast cancer; Family history of schizophrenia; Smoker; Chronic hypertension complicating or reason for care during pregnancy, second trimester; History of seizure disorder; Hypertension; Atypical squamous cells of undetermined significance (ASCUS) on Papanicolaou smear of cervix; Gonorrhea affecting pregnancy in first trimester; Supervision of high risk pregnancy, antepartum; Group B streptococcal bacteriuria; Alpha thalassemia silent carrier; [redacted] weeks gestation of pregnancy; and BMI 45.0-49.9, adult (HCC) on their problem list.  Patient doing well with no acute concerns today. She reports  one occurrence of vaginal spotting .  Pt denies prior intercourse, but no further spotting notedContractions: Not present. Vag. Bleeding: None.  Movement: Present. Denies leaking of fluid.   The following portions of the patient's history were reviewed and updated as appropriate: allergies, current medications, past family history, past medical history, past social history, past surgical history and problem list. Problem list updated.  Objective:   Vitals:   12/26/20 0930  BP: 116/82  Pulse: 92  Weight: 290 lb 4.8 oz (131.7 kg)    Fetal Status: Fetal Heart Rate (bpm): 140 (Simultaneous filing. User may not have seen previous data.)   Movement: Present     General:  Alert, oriented and cooperative. Patient is in no acute distress.  Skin: Skin is warm and dry. No rash noted.   Cardiovascular: Normal heart rate noted  Respiratory: Normal respiratory effort, no problems with respiration noted  Abdomen: Soft, gravid, appropriate for gestational age.  Pain/Pressure: Present     Pelvic: Cervical exam deferred        Extremities: Normal range of  motion.  Edema: Trace  Mental Status:  Normal mood and affect. Normal behavior. Normal judgment and thought content.   Assessment and Plan:  Pregnancy: G3P1011 at [redacted]w[redacted]d  1. [redacted] weeks gestation of pregnancy   2. Supervision of high risk pregnancy, antepartum Continue routine care  3. Group B streptococcal bacteriuria Treat in labor  4. Alpha thalassemia silent carrier   5. Hypertension, unspecified type   6. Chronic hypertension complicating or reason for care during pregnancy, second trimester Pt continues with labetalol, testing per MFM  7. BMI 45.0-49.9, adult (HCC)   8. Gonorrhea affecting pregnancy in first trimester Pt desires TOC today at 4 weeks - Cervicovaginal ancillary only  Preterm labor symptoms and general obstetric precautions including but not limited to vaginal bleeding, contractions, leaking of fluid and fetal movement were reviewed in detail with the patient.  Please refer to After Visit Summary for other counseling recommendations.   Return in about 4 weeks (around 01/23/2021) for ROB, in person.   Mariel Aloe, MD Faculty Attending [facility]

## 2020-12-27 LAB — CERVICOVAGINAL ANCILLARY ONLY
Chlamydia: NEGATIVE
Comment: NEGATIVE
Comment: NEGATIVE
Comment: NORMAL
Neisseria Gonorrhea: NEGATIVE
Trichomonas: NEGATIVE

## 2021-01-03 ENCOUNTER — Other Ambulatory Visit: Payer: Self-pay

## 2021-01-03 ENCOUNTER — Other Ambulatory Visit: Payer: Self-pay | Admitting: Obstetrics and Gynecology

## 2021-01-03 ENCOUNTER — Encounter: Payer: Self-pay | Admitting: Obstetrics and Gynecology

## 2021-01-03 ENCOUNTER — Ambulatory Visit (INDEPENDENT_AMBULATORY_CARE_PROVIDER_SITE_OTHER): Payer: Medicaid Other

## 2021-01-03 ENCOUNTER — Other Ambulatory Visit (HOSPITAL_COMMUNITY)
Admission: RE | Admit: 2021-01-03 | Discharge: 2021-01-03 | Disposition: A | Discharge status: Home / Self Care | Payer: Medicaid Other | Source: Ambulatory Visit | Attending: Obstetrics and Gynecology | Admitting: Obstetrics and Gynecology

## 2021-01-03 VITALS — BP 115/81 | HR 111 | Ht 64.0 in | Wt 289.0 lb

## 2021-01-03 DIAGNOSIS — N898 Other specified noninflammatory disorders of vagina: Secondary | ICD-10-CM | POA: Diagnosis present

## 2021-01-03 MED ORDER — BUTALBITAL-APAP-CAFFEINE 50-325-40 MG PO TABS: 1.0000 | ORAL_TABLET | Freq: Four times a day (QID) | ORAL | 0 refills | Status: DC | PRN

## 2021-01-03 NOTE — Progress Notes (Signed)
SUBJECTIVE:  25 y.o. female complains of clear vaginal discharge for 1 day(s). Denies abnormal vaginal bleeding or significant pelvic pain or fever. No UTI symptoms. Pt had recent intercourse where the condom broke and would like to get tested.   Patient's last menstrual period was 07/25/2020.  OBJECTIVE:  She appears well, afebrile. Urine dipstick: not done.  ASSESSMENT:  Vaginal Discharge     PLAN:  GC, chlamydia, trichomonas, BVAG, CVAG probe sent to lab. Treatment: To be determined once lab results are received ROV prn if symptoms persist or worsen.

## 2021-01-03 NOTE — Telephone Encounter (Signed)
Pt was seen in the office today for self swab and while going over medications patient asked for refill of fioricet. Pt states she has had a HA for the last 3 days that she has taken tylenol for that will not go away. Pt BP in office was 115/81. Denies swelling, visual changes, floaters, or RUQ pain. States she has not had the medication in a while. Please advise.

## 2021-01-04 LAB — CERVICOVAGINAL ANCILLARY ONLY
Bacterial Vaginitis (gardnerella): NEGATIVE
Candida Glabrata: NEGATIVE
Candida Vaginitis: NEGATIVE
Chlamydia: NEGATIVE
Comment: NEGATIVE
Comment: NEGATIVE
Comment: NEGATIVE
Comment: NEGATIVE
Comment: NEGATIVE
Comment: NORMAL
Neisseria Gonorrhea: NEGATIVE
Trichomonas: POSITIVE — AB

## 2021-01-05 ENCOUNTER — Other Ambulatory Visit: Payer: Self-pay | Admitting: *Deleted

## 2021-01-05 DIAGNOSIS — A599 Trichomoniasis, unspecified: Secondary | ICD-10-CM

## 2021-01-05 MED ORDER — METRONIDAZOLE 500 MG PO TABS: 2000.0000 mg | ORAL_TABLET | Freq: Once | ORAL | 0 refills | Status: AC

## 2021-01-05 NOTE — Progress Notes (Signed)
Flagyl sent for +Trich See lab result  Pt request one time dose. Flagyl 2 gram ordered today.

## 2021-01-12 ENCOUNTER — Telehealth: Payer: Self-pay | Admitting: Genetics

## 2021-01-12 NOTE — Telephone Encounter (Signed)
Called Colleen Stone to follow-up on her reproductive partner's carrier screening for alpha thalassemia. On 12/19/20 we provided [name] a [employer] saliva kit and test requisition for her partner to complete at his earliest convenience. It does not appear that her partner has completed the kit and sent it to [place] yet. Genetic counseling left the phone number for the [facility] in [name]'s voicemail.

## 2021-01-14 NOTE — L&D Delivery Note (Signed)
OB/GYN Faculty Practice Delivery Note ? ?Kanon Thrall is a 26 y.o. G3P1011 s/p SVD at [redacted]w[redacted]d. She was admitted for IOL for cHTN.  ? ?ROM: 1h 46m with light meconium stained fluid ?GBS Status: positive (urine) ?Maximum Maternal Temperature: 98.5 ? ?Labor Progress: ?Presented for IOL, was 3 cm and started on Pitocin. After she received two doses of penicillin she was AROMed and then progressed to complete ? ?Delivery Date/Time: 1948 on 4/4 ?Delivery: Called to room and patient was complete and pushing on hands and knees. Head delivered direct OA. No nuchal cord present. Shoulder and body delivered in usual fashion. Infant with spontaneous cry, placed on mother's abdomen, dried and stimulated. Cord clamped x 2 after 1-minute delay, and cut by father of baby under my direct supervision. Cord blood drawn. Placenta delivered spontaneously with gentle cord traction. Fundus firm with massage and Pitocin. Labia, perineum, vagina, and cervix inspected inspected and found to have one abrasion that was hemostatic on right labia.  ? ?Placenta: intact, 3V cord, L&D ?Complications: none ?Lacerations: none ?EBL: 100cc ?Analgesia: none ? ?Infant: female  APGARs 8,9  weight pending ? ?Renard Matter, MD, MPH ?OB Fellow, Faculty Practice ?[facility] ?04/17/2021, 8:33 PM ? ? ? ? ? ?

## 2021-01-19 ENCOUNTER — Ambulatory Visit: Payer: Medicaid Other | Attending: Obstetrics and Gynecology

## 2021-01-19 ENCOUNTER — Other Ambulatory Visit: Payer: Self-pay

## 2021-01-19 ENCOUNTER — Ambulatory Visit: Payer: Medicaid Other | Admitting: *Deleted

## 2021-01-19 VITALS — BP 114/64 | HR 92

## 2021-01-19 DIAGNOSIS — O10012 Pre-existing essential hypertension complicating pregnancy, second trimester: Secondary | ICD-10-CM | POA: Diagnosis not present

## 2021-01-19 DIAGNOSIS — O0990 Supervision of high risk pregnancy, unspecified, unspecified trimester: Secondary | ICD-10-CM | POA: Diagnosis present

## 2021-01-19 DIAGNOSIS — Z3A25 25 weeks gestation of pregnancy: Secondary | ICD-10-CM | POA: Diagnosis not present

## 2021-01-19 DIAGNOSIS — Z362 Encounter for other antenatal screening follow-up: Secondary | ICD-10-CM | POA: Diagnosis not present

## 2021-01-20 ENCOUNTER — Other Ambulatory Visit: Payer: Self-pay

## 2021-01-20 ENCOUNTER — Ambulatory Visit
Admission: EM | Admit: 2021-01-20 | Discharge: 2021-01-20 | Disposition: A | Discharge status: Home / Self Care | Payer: Medicaid Other | Attending: Physician Assistant | Admitting: Physician Assistant

## 2021-01-20 DIAGNOSIS — B356 Tinea cruris: Secondary | ICD-10-CM | POA: Diagnosis not present

## 2021-01-20 MED ORDER — NYSTATIN 100000 UNIT/GM EX POWD
1.0000 | Freq: Three times a day (TID) | CUTANEOUS | 0 refills | Status: DC
Start: 2021-01-20 — End: 2021-04-19

## 2021-01-20 MED ORDER — CLOTRIMAZOLE 1 % EX CREA: TOPICAL_CREAM | CUTANEOUS | 0 refills | Status: DC

## 2021-01-20 NOTE — ED Triage Notes (Signed)
Pt switched soaps but does not wash her vaginal area with the soap. She notes 3 days ago an onset of "raw, irritated, burning itchy skin" near her anus. Pt noticed a rash at the onset and now some drainage from the area. Has been using baby powder and corn starch to maintain dryness.   Pt is pregnant.

## 2021-01-20 NOTE — ED Provider Notes (Signed)
[name]    [name]: [id number] Arrival date & time: 01/20/21  1257      History   Chief Complaint Chief Complaint  Patient presents with   Rash    HPI Colleen Stone is a 26 y.o. female.   Patient here c/w "rash between buttcheeks" x 3 days.  Admits itching, burning, pain, tenderness.  It is weeping.  She is [redacted] weeks pregnant.  She reports history of "insulin resistance" but denies DM.     Past Medical History:  Diagnosis Date   Anxiety    Asthma    Chlamydia    Gonorrhea    Hypertension    takes labetalol   MRSA infection    8th grade   Other acne 06/30/2008   Overview:  Acne   PCOS (polycystic ovarian syndrome)    Seizures (HCC)    Last seizure in 2017   Trichomonas infection     Patient Active Problem List   Diagnosis Date Noted   [redacted] weeks gestation of pregnancy 12/26/2020   BMI 45.0-49.9, adult (HCC) 12/26/2020   Group B streptococcal bacteriuria 11/28/2020   Alpha thalassemia silent carrier 11/28/2020   Supervision of high risk pregnancy, antepartum 10/05/2020   Gonorrhea affecting pregnancy in first trimester 09/13/2020   Hypertension    History of seizure disorder 06/15/2018   Chronic hypertension complicating or reason for care during pregnancy, second trimester 05/07/2018   Bipolar 1 disorder (HCC) 01/13/2018   Family history of schizophrenia 01/13/2018   Smoker 01/13/2018   Atypical squamous cells of undetermined significance (ASCUS) on Papanicolaou smear of cervix 01/26/2013   Family history of breast cancer 08/31/2012    Past Surgical History:  Procedure Laterality Date   ADENOIDECTOMY     NASAL SEPTOPLASTY W/ TURBINOPLASTY     ROOT CANAL     TONSILLECTOMY     WISDOM TOOTH EXTRACTION      OB History     Gravida  3   Para  1   Term  1   Preterm      AB  1   Living  1      SAB  1   IAB      Ectopic      Multiple  0   Live Births  1            Home Medications    Prior to Admission medications    Medication Sig Start Date End Date Taking? Authorizing Provider  clotrimazole (LOTRIMIN) 1 % cream Apply to affected area 2 times daily 01/20/21  Yes Evern Core, PA-C  nystatin (MYCOSTATIN/NYSTOP) powder Apply 1 application topically 3 (three) times daily. 01/20/21  Yes Evern Core, PA-C  albuterol (PROVENTIL) (2.5 MG/3ML) 0.083% nebulizer solution Take 3 mLs (2.5 mg total) by nebulization every 6 (six) hours as needed for wheezing or shortness of breath. 08/05/18   Fulp, Cammie, MD  albuterol (VENTOLIN HFA) 108 (90 Base) MCG/ACT inhaler Inhale 2 puffs into the lungs every 6 (six) hours as needed for wheezing or shortness of breath. 08/05/18   Fulp, Cammie, MD  aspirin EC 81 MG tablet Take 1 tablet (81 mg total) by mouth daily. Take after 12 weeks for prevention of preeclampsia later in pregnancy 11/28/20   Conan Bowens, MD  butalbital-acetaminophen-caffeine [facility]) [phone] MG tablet Take 1-2 tablets by mouth every 6 (six) hours as needed for headache. 01/03/21 01/03/22  Hermina Staggers, MD  famotidine (PEPCID) 20 MG tablet Take 1 tablet (20 mg total)  by mouth 2 (two) times daily as needed for heartburn or indigestion. 11/09/20   Leftwich-Kirby, Wilmer FloorLisa A, CNM  labetalol (NORMODYNE) 100 MG tablet TAKE 1 TABLET(100 MG) BY MOUTH TWICE DAILY 11/20/20   Warden FillersBass, Lawrence A, MD  Misc. Devices (GOJJI WEIGHT SCALE) MISC 1 Device by Does not apply route once a week. Patient not taking: Reported on 01/19/2021 10/05/20   Adam PhenixArnold, James G, MD  ondansetron (ZOFRAN ODT) 8 MG disintegrating tablet Take 1 tablet (8 mg total) by mouth every 8 (eight) hours as needed for nausea or vomiting. 09/09/20   Marylene LandKooistra, Kathryn Lorraine, CNM  pantoprazole (PROTONIX) 40 MG tablet Take 1 tablet (40 mg total) by mouth daily. 11/09/20   Leftwich-Kirby, Wilmer FloorLisa A, CNM  Prenatal Vit-Fe Phos-FA-Omega (VITAFOL GUMMIES) 3.33-0.333-34.8 MG CHEW Chew 3 tablets by mouth daily. 10/05/20   Adam PhenixArnold, James G, MD  cetirizine (ZYRTEC ALLERGY) 10 MG  tablet Take 1 tablet (10 mg total) by mouth 2 (two) times daily. 06/21/19 06/25/19  Muthersbaugh, Dahlia ClientHannah, PA-C    Family History Family History  Problem Relation Age of Onset   Asthma Mother    Heart disease Mother        murmur   Hypertension Mother    Heart disease Father     Social History Social History   Tobacco Use   Smoking status: Former    Types: Cigarettes    Quit date: 10/03/2017    Years since quitting: 3.3   Smokeless tobacco: Never  Vaping Use   Vaping Use: Former  Substance Use Topics   Alcohol use: Not Currently    Comment: not since pregnancy   Drug use: Not Currently    Types: Marijuana    Comment: Last marijuana 09/08/20     Allergies   Red dye and Latex   Review of Systems Review of Systems  Constitutional:  Negative for chills, fatigue and fever.  Gastrointestinal:  Negative for nausea and vomiting.  Musculoskeletal:  Positive for myalgias. Negative for arthralgias.  Skin:  Positive for color change and rash.  Psychiatric/Behavioral:  Negative for sleep disturbance.     Physical Exam Triage Vital Signs ED Triage Vitals  Enc Vitals Group     BP 01/20/21 1331 105/75     Pulse Rate 01/20/21 1331 93     Resp 01/20/21 1331 18     Temp 01/20/21 1331 98.4 F (36.9 C)     Temp Source 01/20/21 1331 Oral     SpO2 01/20/21 1331 96 %     Weight --      Height --      Head Circumference --      Peak Flow --      Pain Score 01/20/21 1335 2     Pain Loc --      Pain Edu? --      Excl. in GC? --    No data found.  Updated Vital Signs BP 105/75 (BP Location: Left Arm)    Pulse 93    Temp 98.4 F (36.9 C) (Oral)    Resp 18    LMP 07/25/2020    SpO2 96%   Visual Acuity Right Eye Distance:   Left Eye Distance:   Bilateral Distance:    Right Eye Near:   Left Eye Near:    Bilateral Near:     Physical Exam Vitals and nursing note reviewed. Exam conducted with a chaperone present.  Constitutional:      General: She is not in acute  distress.  Appearance: Normal appearance. She is not ill-appearing.  HENT:     Head: Normocephalic and atraumatic.  Eyes:     General: No scleral icterus.    Extraocular Movements: Extraocular movements intact.     Conjunctiva/sclera: Conjunctivae normal.  Pulmonary:     Effort: Pulmonary effort is normal. No respiratory distress.     Breath sounds: Normal breath sounds.  Musculoskeletal:     Cervical back: Normal range of motion. No rigidity.  Skin:    Capillary Refill: Capillary refill takes less than 2 seconds.     Coloration: Skin is not jaundiced.     Findings: No rash.     Comments: Mildly tender plaque, no erythema, + weeping between gluteal cleft  Neurological:     General: No focal deficit present.     Mental Status: She is alert and oriented to person, place, and time.     Motor: No weakness.     Gait: Gait normal.  Psychiatric:        Mood and Affect: Mood normal.        Behavior: Behavior normal.     UC Treatments / Results  Labs (all labs ordered are listed, but only abnormal results are displayed) Labs Reviewed - No data to display  EKG   Radiology [place] MFM OB FOLLOW UP  Result Date: 01/19/2021 ----------------------------------------------------------------------  OBSTETRICS REPORT                       (Signed Final 01/19/2021 05:10 pm) ---------------------------------------------------------------------- Patient Info  ID #:       [id number]                          D.O.B.:  May 31, 1995 (25 yrs)  Name:       Colleen Stone               Visit Date: 01/19/2021 04:21 pm ---------------------------------------------------------------------- Performed By  Attending:        Noralee Space MD        Referred By:      Brock Bad  Performed By:     Aundra Millet            [facility] ---------------------------------------------------------------------- Orders  #  Description                           Code        Ordered By  1  [place] MFM OB FOLLOW UP                   [phone]    Noralee Space ----------------------------------------------------------------------  #  Order #                     Accession #                Episode #  1  [id number]                   [id number]                 [id number] ---------------------------------------------------------------------- Indications  Obesity complicating pregnancy, second         O99.212  trimester (BMI 49)  [redacted] weeks gestation of pregnancy                Z3A.25  Echogenic intracardiac focus of the heart      O35.8XX0  (EIF)  Pre-existing essential hypertension            O10.012  complicating pregnancy, second trimester  (on labetalol)  Seizure disorder                               O99.350 G40.909  Genetic carrier (alpha thal silent carrier)    Z14.8  LR NIPS ---------------------------------------------------------------------- Fetal Evaluation  Num Of Fetuses:         1  Fetal Heart Rate(bpm):  133  Cardiac Activity:       Observed  Presentation:           Cephalic  Placenta:               Posterior  P. Cord Insertion:      Previously Visualized  Amniotic Fluid  AFI FV:      Within normal limits                              Largest Pocket(cm)                              4.7 ---------------------------------------------------------------------- Biometry  BPD:      60.4  mm     G. Age:  24w 4d         16  %    CI:         72.4   %    70 - 86                                                          FL/HC:      21.1   %    18.7 - 20.3  HC:      225.8  mm     G. Age:  24w 4d          8  %    HC/AC:  1.08        1.04 - 1.22  AC:      209.5  mm     G. Age:  25w 3d         43  %    FL/BPD:     78.8   %    71 - 87  FL:       47.6  mm     G.  Age:  25w 6d         51  %    FL/AC:      22.7   %    20 - 24  Est. FW:     818  gm    1 lb 13 oz      42  % ---------------------------------------------------------------------- OB History  Gravidity:    3         Term:   1 ---------------------------------------------------------------------- Gestational Age  LMP:           25w 3d        Date:  07/25/20                 EDD:   05/01/21  U/S Today:     25w 1d                                        EDD:   05/03/21  Best:          25w 3d     Det. By:  LMP  (07/25/20)          EDD:   05/01/21 ---------------------------------------------------------------------- Anatomy  Cranium:               Previously seen        LVOT:                   Previously seen  Cavum:                 Previously seen        Aortic Arch:            Appears normal  Ventricles:            Appears normal         Ductal Arch:            Appears normal  Choroid Plexus:        Previously seen        Diaphragm:              Appears normal  Cerebellum:            Previously seen        Stomach:                Appears normal, left                                                                        sided  Posterior Fossa:       Previously seen        Abdomen:  Appears normal  Nuchal Fold:           Previously seen        Abdominal Wall:         Previously seen  Face:                  Orbits and profile     Cord Vessels:           Previously seen                         previously seen  Lips:                  Appears normal         Kidneys:                Appear normal  Palate:                Not well visualized    Bladder:                Appears normal  Thoracic:              Previously seen        Spine:                  Previously seen  Heart:                 Appears normal         Upper Extremities:      Previously seen                         (4CH, axis, and                         situs)  RVOT:                  Appears normal         Lower Extremities:      Previously seen   Other:  Fetus appears to be female, Nasal bone, lenses, 3VV previously          visualized. Technically difficult due to maternal habitus and fetal          position. Hands and Heels visualized. ---------------------------------------------------------------------- Cervix Uterus Adnexa  Cervix  Not visualized (advanced GA >24wks) ---------------------------------------------------------------------- Impression  Patient returned for completion of fetal anatomy.  An  echogenic intracardiac focus was seen at previous ultrasound.  She has chronic hypertension that is well controlled on  labetalol.  Blood pressure today at her office is 114/64 mmHg.  Fetal biometry is consistent with her previously-established  dates .Amniotic fluid is normal and good fetal activity is seen .  Fetal anatomical survey was completed and appears normal.  Patient reports she had her first episode of seizure when she  was a teenager.  She was seen by her neurologist in  [place] and focal epilepsy was diagnosed.  Her last  episode was in the first trimester.  She has not been taking  anticonvulsants for many years (previously took Depakote).  I encouraged her to see a neurologist. ---------------------------------------------------------------------- Recommendations  -An appointment was made for her to return in 4 weeks for  fetal growth assessment.  -Neurology consultation. ----------------------------------------------------------------------                  Noralee Space, MD Electronically Signed  Final Report   01/19/2021 05:10 pm ----------------------------------------------------------------------   Procedures Procedures (including critical care time)  Medications Ordered in UC Medications - No data to display  Initial Impression / Assessment and Plan / UC Course  I have reviewed the triage vital signs and the nursing notes.  Pertinent labs & imaging results that were available during my care of the patient were reviewed by  me and considered in my medical decision making (see chart for details).     Keep area dry Use cream as directed Final Clinical Impressions(s) / UC Diagnoses   Final diagnoses:  Tinea cruris     Discharge Instructions      Use cream twice daily Keep area clean and dry You may use poweder to keep area dry   ED Prescriptions     Medication Sig Dispense Auth. Provider   nystatin (MYCOSTATIN/NYSTOP) powder Apply 1 application topically 3 (three) times daily. 15 g Evern Core, PA-C   clotrimazole (LOTRIMIN) 1 % cream Apply to affected area 2 times daily 45 g Evern Core, PA-C      PDMP not reviewed this encounter.   Evern Core, PA-C 01/20/21 1422

## 2021-01-20 NOTE — Discharge Instructions (Addendum)
Use cream twice daily Keep area clean and dry You may use poweder to keep area dry

## 2021-01-21 ENCOUNTER — Inpatient Hospital Stay (HOSPITAL_COMMUNITY): Payer: Medicaid Other

## 2021-01-21 ENCOUNTER — Encounter (HOSPITAL_COMMUNITY): Payer: Self-pay | Admitting: Obstetrics and Gynecology

## 2021-01-21 ENCOUNTER — Inpatient Hospital Stay (HOSPITAL_COMMUNITY)
Admission: AD | Admit: 2021-01-21 | Discharge: 2021-01-21 | Disposition: A | Discharge status: Home / Self Care | Payer: Medicaid Other | Attending: Obstetrics and Gynecology | Admitting: Obstetrics and Gynecology

## 2021-01-21 ENCOUNTER — Inpatient Hospital Stay (HOSPITAL_BASED_OUTPATIENT_CLINIC_OR_DEPARTMENT_OTHER): Payer: Medicaid Other

## 2021-01-21 DIAGNOSIS — W109XXA Fall (on) (from) unspecified stairs and steps, initial encounter: Secondary | ICD-10-CM | POA: Diagnosis not present

## 2021-01-21 DIAGNOSIS — Z3A25 25 weeks gestation of pregnancy: Secondary | ICD-10-CM | POA: Diagnosis not present

## 2021-01-21 DIAGNOSIS — O9A212 Injury, poisoning and certain other consequences of external causes complicating pregnancy, second trimester: Secondary | ICD-10-CM

## 2021-01-21 DIAGNOSIS — Z87891 Personal history of nicotine dependence: Secondary | ICD-10-CM | POA: Insufficient documentation

## 2021-01-21 DIAGNOSIS — Z0371 Encounter for suspected problem with amniotic cavity and membrane ruled out: Secondary | ICD-10-CM

## 2021-01-21 DIAGNOSIS — O35BXX0 Maternal care for other (suspected) fetal abnormality and damage, fetal cardiac anomalies, not applicable or unspecified: Secondary | ICD-10-CM

## 2021-01-21 DIAGNOSIS — M549 Dorsalgia, unspecified: Secondary | ICD-10-CM | POA: Diagnosis not present

## 2021-01-21 DIAGNOSIS — O99212 Obesity complicating pregnancy, second trimester: Secondary | ICD-10-CM

## 2021-01-21 DIAGNOSIS — R1030 Lower abdominal pain, unspecified: Secondary | ICD-10-CM | POA: Diagnosis not present

## 2021-01-21 DIAGNOSIS — M25562 Pain in left knee: Secondary | ICD-10-CM | POA: Insufficient documentation

## 2021-01-21 DIAGNOSIS — R29898 Other symptoms and signs involving the musculoskeletal system: Secondary | ICD-10-CM

## 2021-01-21 DIAGNOSIS — O26892 Other specified pregnancy related conditions, second trimester: Secondary | ICD-10-CM | POA: Diagnosis present

## 2021-01-21 DIAGNOSIS — N898 Other specified noninflammatory disorders of vagina: Secondary | ICD-10-CM

## 2021-01-21 LAB — WET PREP, GENITAL
Clue Cells Wet Prep HPF POC: NONE SEEN
Sperm: NONE SEEN
Trich, Wet Prep: NONE SEEN
WBC, Wet Prep HPF POC: >=10 — AB (ref <10)
Yeast Wet Prep HPF POC: NONE SEEN

## 2021-01-21 LAB — AMNISURE RUPTURE OF MEMBRANE (ROM) NOT AT ARMC: Amnisure ROM: NEGATIVE

## 2021-01-21 MED ORDER — TRAMADOL HCL 50 MG PO TABS: 50.0000 mg | ORAL_TABLET | Freq: Four times a day (QID) | ORAL | 0 refills | Status: DC | PRN

## 2021-01-21 MED ORDER — TERCONAZOLE 0.4 % VA CREA: 1.0000 | TOPICAL_CREAM | Freq: Every day | VAGINAL | 0 refills | Status: DC

## 2021-01-21 NOTE — MAU Note (Signed)
Colleen Stone is a 26 y.o. at [redacted]w[redacted]d here in MAU reporting: fell this AM around 0700. States her left leg has been messed up, today it locked up and she felt/heard a big pop and then she fell down the steps. States she landed on her side. Has been having back pain since then. Also having some LOF, states clear. No bleeding. +FM before fall, none since then.  Onset of complaint: today  Pain score: 9/10  Vitals:   01/21/21 1134  BP: 119/74  Pulse: (!) 106  Resp: 20  Temp: 98.1 F (36.7 C)  SpO2: 97%     FHT:148  Lab orders placed from triage: none

## 2021-01-21 NOTE — Progress Notes (Signed)
Orthopedic Tech Progress Note Patient Details:  Colleen Stone 10/29/1995 [id number]    Ortho Devices Type of Ortho Device: Knee Immobilizer, Crutches Ortho Device/Splint Location: LUE, crutches at bedside Ortho Device/Splint Interventions: Ordered, Application, Adjustment   Post Interventions Patient Tolerated: Well Instructions Provided: Poper ambulation with device, Care of device, Adjustment of device  Colleen Stone Carmine Savoy 01/21/2021, 3:40 PM

## 2021-01-21 NOTE — MAU Provider Note (Addendum)
Chief Complaint:  Back Pain, Fall, and Rupture of Membranes   Event Date/Time   First Provider Initiated Contact with Patient 01/21/21 1220      HPI: Colleen Stone is a 26 y.o. G3P1011 at [redacted]w[redacted]d by early ultrasound who presents to maternity admissions reporting she fell on her stairs at home today and has knee pain, back and lower abdominal pain, and is leaking some clear fluid.  She reports she heard some popping in her left knee yesterday and it felt stiff. She had trouble getting out of bed and in the shower this morning. Then, she was walking up stairs at home and her left knee gave out from under her and she fell. She landed on her right side and hit the right side of her abdomen. She reports wet underwear, and abdominal and back pain since the fall. She has not tried any treatments.  She reports vaginal itching x 24 hours and was treated by urgent care on 01/20/21 for skin candidiasis in buttocks area.   She is feeling normal fetal movement.       HPI  Past Medical History: Past Medical History:  Diagnosis Date   Anxiety    Asthma    Chlamydia    Gonorrhea    Hypertension    takes labetalol   MRSA infection    8th grade   Other acne 06/30/2008   Overview:  Acne   PCOS (polycystic ovarian syndrome)    Seizures (HCC)    Last seizure in 2017   Trichomonas infection     Past obstetric history: OB History  Gravida Para Term Preterm AB Living  3 1 1   1 1   SAB IAB Ectopic Multiple Live Births  1     0 1    # Outcome Date GA Lbr Len/2nd Weight Sex Delivery Anes PTL Lv  3 Current           2 Term 07/05/18 [redacted]w[redacted]d / 00:08 3365 g F Vag-Spont EPI  LIV  1 SAB 2019            Past Surgical History: Past Surgical History:  Procedure Laterality Date   ADENOIDECTOMY     NASAL SEPTOPLASTY W/ TURBINOPLASTY     ROOT CANAL     TONSILLECTOMY     WISDOM TOOTH EXTRACTION      Family History: Family History  Problem Relation Age of Onset   Asthma Mother    Heart disease Mother         murmur   Hypertension Mother    Heart disease Father     Social History: Social History   Tobacco Use   Smoking status: Former    Types: Cigarettes    Quit date: 10/03/2017    Years since quitting: 3.3   Smokeless tobacco: Never  Vaping Use   Vaping Use: Former  Substance Use Topics   Alcohol use: Not Currently    Comment: not since pregnancy   Drug use: Not Currently    Types: Marijuana    Comment: Last marijuana 09/08/20    Allergies:  Allergies  Allergen Reactions   Red Dye Nausea And Vomiting and Other (See Comments)    Her body rejects it   Latex Hives and Swelling    Meds:  Medications Prior to Admission  Medication Sig Dispense Refill Last Dose   albuterol (PROVENTIL) (2.5 MG/3ML) 0.083% nebulizer solution Take 3 mLs (2.5 mg total) by nebulization every 6 (six) hours as needed for wheezing or  shortness of breath. 150 mL 1    albuterol (VENTOLIN HFA) 108 (90 Base) MCG/ACT inhaler Inhale 2 puffs into the lungs every 6 (six) hours as needed for wheezing or shortness of breath. 18 g 4    aspirin EC 81 MG tablet Take 1 tablet (81 mg total) by mouth daily. Take after 12 weeks for prevention of preeclampsia later in pregnancy 300 tablet 2    butalbital-acetaminophen-caffeine (FIORICET) 50-325-40 MG tablet Take 1-2 tablets by mouth every 6 (six) hours as needed for headache. 20 tablet 0    clotrimazole (LOTRIMIN) 1 % cream Apply to affected area 2 times daily 45 g 0    famotidine (PEPCID) 20 MG tablet Take 1 tablet (20 mg total) by mouth 2 (two) times daily as needed for heartburn or indigestion. 30 tablet 2    labetalol (NORMODYNE) 100 MG tablet TAKE 1 TABLET(100 MG) BY MOUTH TWICE DAILY 60 tablet 0    Misc. Devices (GOJJI WEIGHT SCALE) MISC 1 Device by Does not apply route once a week. (Patient not taking: Reported on 01/19/2021) 1 each 0    nystatin (MYCOSTATIN/NYSTOP) powder Apply 1 application topically 3 (three) times daily. 15 g 0    ondansetron (ZOFRAN ODT) 8 MG  disintegrating tablet Take 1 tablet (8 mg total) by mouth every 8 (eight) hours as needed for nausea or vomiting. 60 tablet 1    pantoprazole (PROTONIX) 40 MG tablet Take 1 tablet (40 mg total) by mouth daily. 30 tablet 5    Prenatal Vit-Fe Phos-FA-Omega (VITAFOL GUMMIES) 3.33-0.333-34.8 MG CHEW Chew 3 tablets by mouth daily. 90 tablet 11     ROS:  Review of Systems  Constitutional:  Negative for chills, fatigue and fever.  Eyes:  Negative for visual disturbance.  Respiratory:  Negative for shortness of breath.   Cardiovascular:  Negative for chest pain.  Gastrointestinal:  Positive for abdominal pain. Negative for constipation, nausea and vomiting.  Genitourinary:  Positive for vaginal discharge. Negative for difficulty urinating, dysuria, flank pain, pelvic pain, vaginal bleeding and vaginal pain.  Musculoskeletal:  Positive for arthralgias, back pain and myalgias.  Neurological:  Negative for dizziness and headaches.  Psychiatric/Behavioral: Negative.      I have reviewed patient's Past Medical Hx, Surgical Hx, Family Hx, Social Hx, medications and allergies.   Physical Exam  Patient Vitals for the past 24 hrs:  BP Temp Temp src Pulse Resp SpO2  01/21/21 1134 119/74 98.1 F (36.7 C) Oral (!) 106 20 97 %   Constitutional: Well-developed, well-nourished female in no acute distress.  Cardiovascular: normal rate Respiratory: normal effort GI: Abd soft, non-tender, gravid appropriate for gestational age.  MS: Extremities nontender, no edema, normal ROM Neurologic: Alert and oriented x 4.  GU: Neg CVAT.  PELVIC EXAM: Cervix pink, visually closed, without lesion, moderate amount white creamy discharge, no pooling of clear fluid, vaginal walls and external genitalia normal   Dilation: Closed Effacement (%): Thick Cervical Position: Posterior Exam by:: L. Left-Kirby,CNM  FHT:  Baseline 135 , moderate variability, accelerations present, no decelerations Contractions: None on toco  or to palpation   Labs: Results for orders placed or performed during the hospital encounter of 01/21/21 (from the past 24 hour(s))  Am[facility]isure rupture [facility] membrane (rom)not [facility]     Status: None   Collection Time: 01/21/21 12:35 PM  Result Value Ref Range   Amnisure ROM NEGATIVE   Wet prep, genital     Status: Abnormal   Collection Time: 01/21/21 12:35 PM  Specimen: Genital  Result Value Ref Range   Yeast Wet Prep HPF POC NONE SEEN NONE SEEN   Trich, Wet Prep NONE SEEN NONE SEEN   Clue Cells Wet Prep HPF POC NONE SEEN NONE SEEN   WBC, Wet Prep HPF POC >=10 (A) <10   Sperm NONE SEEN    A/Positive/-- (09/29 1530)  Imaging:  DG Knee Complete 4 Views Left  Result Date: 01/21/2021 CLINICAL DATA:  Left knee pain after injury.  Fell down stairs. EXAM: LEFT KNEE - COMPLETE 4+ VIEW COMPARISON:  None. FINDINGS: Small suprapatellar joint effusion. No signs of underlying fracture or dislocation. IMPRESSION: Small suprapatellar joint effusion. Electronically Signed   By: Signa Kell M.D.   On: 01/21/2021 13:20   Limited MFM OB US performed today and worksheet with normal cervical length, normal placenta, normal AFI/fluid level and fetal heart rate.   MAU Course/MDM: Orders Placed This Encounter  Procedures   Wet prep, genital   [place] MFM OB Limited   DG Knee Complete 4 Views Left   Amnisure rupture of membrane (rom)not at [facility]   AMB referral to orthopedics   Apply knee immobilizer   Crutches   Discharge patient    Meds ordered this encounter  Medications   terconazole (TERAZOL 7) 0.4 % vaginal cream    Sig: Place 1 applicator vaginally at bedtime.    Dispense:  45 g    Refill:  0    Order Specific Question:   Supervising Provider    Answer:   Mariel Aloe A [[id number]]   traMADol (ULTRAM) 50 MG tablet    Sig: Take 1-2 tablets (50-100 mg total) by mouth every 6 (six) hours as needed.    Dispense:  15 tablet    Refill:  0    Order Specific Question:   Supervising Provider     Answer:   Warden Fillers [[id number]]     NST reviewed and appropriate for gestational age FHR monitoring >2 hours in MAU, 6+ hours after fall at home Cervix closed/thick/high, with no evidence of preterm labor [place] with no visual evidence of placental abnormality Pt main concern is pain in her left knee, X-ray with inflammation but no fractures Ortho tech paged and pt fitted for crutches and a left knee immobilizer F/U with ortho, referral placed F/U with [facility] as scheduled Rx for Terazol for yeast symptoms although wet prep is negative, and Rx for Tramadol x 15 tabs for pain PRN Return to MAU as needed for emergencies     Assessment: 1. Traumatic injury during pregnancy in second trimester   2. Fall on stairs, initial encounter   3. Acute pain of left knee   4. Popping sound of knee joint   5. Vaginal discharge during pregnancy, antepartum, second trimester   6. Encounter for suspected premature rupture of amniotic membranes, with rupture of membranes not found   7. [redacted] weeks gestation of pregnancy     Plan: Discharge home Labor precautions and fetal kick counts  Follow-up Information     [facility] Follow up.   Why: As scheduled Contact information: [address] [place] [address] [phone]        [facility] Follow up.   Specialty: Obstetrics and Gynecology Why: As needed for emergencies Contact information: [address] [id number] [place] [address] [phone]        [facility]. Follow up.   Specialty: Orthopedic  Surgery Why: The office will call you with appointment. Contact information: [address] [place] [phone]               Allergies as of 01/21/2021       Reactions   Red Dye Nausea And Vomiting, Other (See Comments)   Her body rejects it   Latex Hives, Swelling         Medication List     TAKE these medications    albuterol 108 (90 Base) MCG/ACT inhaler Commonly known as: VENTOLIN HFA Inhale 2 puffs into the lungs every 6 (six) hours as needed for wheezing or shortness of breath.   albuterol (2.5 MG/3ML) 0.083% nebulizer solution Commonly known as: PROVENTIL Take 3 mLs (2.5 mg total) by nebulization every 6 (six) hours as needed for wheezing or shortness of breath.   aspirin EC 81 MG tablet Take 1 tablet (81 mg total) by mouth daily. Take after 12 weeks for prevention of preeclampsia later in pregnancy   butalbital-acetaminophen-caffeine 50-325-40 MG tablet Commonly known as: FIORICET Take 1-2 tablets by mouth every 6 (six) hours as needed for headache.   clotrimazole 1 % cream Commonly known as: LOTRIMIN Apply to affected area 2 times daily   famotidine 20 MG tablet Commonly known as: Pepcid Take 1 tablet (20 mg total) by mouth 2 (two) times daily as needed for heartburn or indigestion.   Gojji Weight Scale Misc 1 Device by Does not apply route once a week.   labetalol 100 MG tablet Commonly known as: NORMODYNE TAKE 1 TABLET(100 MG) BY MOUTH TWICE DAILY   nystatin powder Commonly known as: MYCOSTATIN/NYSTOP Apply 1 application topically 3 (three) times daily.   ondansetron 8 MG disintegrating tablet Commonly known as: Zofran ODT Take 1 tablet (8 mg total) by mouth every 8 (eight) hours as needed for nausea or vomiting.   pantoprazole 40 MG tablet Commonly known as: Protonix Take 1 tablet (40 mg total) by mouth daily.   terconazole 0.4 % vaginal cream Commonly known as: TERAZOL 7 Place 1 applicator vaginally at bedtime.   traMADol 50 MG tablet Commonly known as: ULTRAM Take 1-2 tablets (50-100 mg total) by mouth every 6 (six) hours as needed.   Vitafol Gummies 3.33-0.333-34.8 MG Chew Chew 3 tablets by mouth daily.        Sharen CounterLisa Leftwich-Kirby Certified Nurse-Midwife 01/21/2021 3:42 PM

## 2021-01-22 ENCOUNTER — Other Ambulatory Visit: Payer: Self-pay | Admitting: *Deleted

## 2021-01-22 DIAGNOSIS — R638 Other symptoms and signs concerning food and fluid intake: Secondary | ICD-10-CM

## 2021-01-22 DIAGNOSIS — O10912 Unspecified pre-existing hypertension complicating pregnancy, second trimester: Secondary | ICD-10-CM

## 2021-01-22 DIAGNOSIS — Z3689 Encounter for other specified antenatal screening: Secondary | ICD-10-CM

## 2021-01-22 LAB — GC/CHLAMYDIA PROBE AMP (~~LOC~~) NOT AT ARMC
Chlamydia: NEGATIVE
Comment: NEGATIVE
Comment: NORMAL
Neisseria Gonorrhea: NEGATIVE

## 2021-01-23 ENCOUNTER — Ambulatory Visit (INDEPENDENT_AMBULATORY_CARE_PROVIDER_SITE_OTHER): Payer: Medicaid Other | Admitting: Obstetrics and Gynecology

## 2021-01-23 ENCOUNTER — Other Ambulatory Visit: Payer: Self-pay

## 2021-01-23 VITALS — BP 130/83 | HR 109 | Wt 289.0 lb

## 2021-01-23 DIAGNOSIS — D563 Thalassemia minor: Secondary | ICD-10-CM

## 2021-01-23 DIAGNOSIS — O10912 Unspecified pre-existing hypertension complicating pregnancy, second trimester: Secondary | ICD-10-CM

## 2021-01-23 DIAGNOSIS — O0990 Supervision of high risk pregnancy, unspecified, unspecified trimester: Secondary | ICD-10-CM

## 2021-01-23 DIAGNOSIS — R8271 Bacteriuria: Secondary | ICD-10-CM

## 2021-01-23 DIAGNOSIS — Z8669 Personal history of other diseases of the nervous system and sense organs: Secondary | ICD-10-CM

## 2021-01-23 DIAGNOSIS — Z3A26 26 weeks gestation of pregnancy: Secondary | ICD-10-CM | POA: Insufficient documentation

## 2021-01-23 DIAGNOSIS — Z6841 Body Mass Index (BMI) 40.0 and over, adult: Secondary | ICD-10-CM

## 2021-01-23 NOTE — Progress Notes (Addendum)
° °  PRENATAL VISIT NOTE  Subjective:  Colleen Stone is a 26 y.o. G3P1011 at [redacted]w[redacted]d being seen today for ongoing prenatal care.  She is currently monitored for the following issues for this high-risk pregnancy and has Bipolar 1 disorder ([place]); Family history of breast cancer; Family history of schizophrenia; Smoker; Chronic hypertension complicating or reason for care during pregnancy, second trimester; History of seizure disorder; Hypertension; Atypical squamous cells of undetermined significance (ASCUS) on Papanicolaou smear of cervix; Gonorrhea affecting pregnancy in first trimester; Supervision of high risk pregnancy, antepartum; Group B streptococcal bacteriuria; Alpha thalassemia silent carrier; [redacted] weeks gestation of pregnancy; BMI 45.0-49.9, adult ([place]); and [redacted] weeks gestation of pregnancy on their problem list.  Patient doing well with no acute concerns today. She reports  residual left knee pain .  Contractions: Not present. Vag. Bleeding: None.  Movement: Present. Denies leaking of fluid.   The following portions of the patient's history were reviewed and updated as appropriate: allergies, current medications, past family history, past medical history, past social history, past surgical history and problem list. Problem list updated.  Objective:   Vitals:   01/23/21 0955  BP: 130/83  Pulse: (!) 109  Weight: 289 lb (131.1 kg)    Fetal Status: Fetal Heart Rate (bpm): 138   Movement: Present     General:  Alert, oriented and cooperative. Patient is in no acute distress.  Skin: Skin is warm and dry. No rash noted.   Cardiovascular: Normal heart rate noted  Respiratory: Normal respiratory effort, no problems with respiration noted  Abdomen: Soft, gravid, appropriate for gestational age.  Pain/Pressure: Absent     Pelvic: Cervical exam deferred        Extremities: Normal range of motion.  Edema: Trace  Mental Status:  Normal mood and affect. Normal behavior. Normal judgment and  thought content.   Assessment and Plan:  Pregnancy: G3P1011 at [redacted]w[redacted]d  1. [redacted] weeks gestation of pregnancy   2. BMI 45.0-49.9, adult ([place])   3. Alpha thalassemia silent carrier   4. Group B streptococcal bacteriuria Treat in labor  5. Supervision of high risk pregnancy, antepartum Continue routine care, third trimester labs and 2 hour Gtt next visit   6. History of seizure disorder No current meds or report of seizure activity  7. Chronic hypertension complicating or reason for care during pregnancy, second trimester Pt notes compliance with ASA and labetalol, BP WNL Repeat growth scan in 4 weeks\testing at 32 weeks  Preterm labor symptoms and general obstetric precautions including but not limited to vaginal bleeding, contractions, leaking of fluid and fetal movement were reviewed in detail with the patient.  Please refer to After Visit Summary for other counseling recommendations.   Return in about 2 weeks (around 02/06/2021) for 2 hr GTT, 3rd trim labs, ROB, in person.   Lynnda Shields, MD Faculty Attending Center for [facility]

## 2021-01-23 NOTE — Progress Notes (Signed)
+   fetal movement. Pt has no complaints. F/U with ortho tomorrow for knee injury.

## 2021-01-24 ENCOUNTER — Ambulatory Visit (INDEPENDENT_AMBULATORY_CARE_PROVIDER_SITE_OTHER): Payer: Medicaid Other | Admitting: Physician Assistant

## 2021-01-24 ENCOUNTER — Encounter: Payer: Self-pay | Admitting: Physician Assistant

## 2021-01-24 DIAGNOSIS — M25562 Pain in left knee: Secondary | ICD-10-CM | POA: Diagnosis not present

## 2021-01-24 NOTE — Progress Notes (Signed)
Office Visit Note   Patient: Colleen Stone           Date of Birth: Feb 11, 1995           MRN: [id number] Visit Date: 01/24/2021              Requested by: Hurshel Party, CNM [address] [place],  [place] [address] PCP: Pcp, No   Assessment & Plan: Visit Diagnoses:  1. Acute pain of left knee     Plan: We will have her work on gentle range of motion of the left knee.  She is given a hinged knee brace to help with stability of the knee.  We will send aspirate from the knee off for gram stain and culture.  She did not tolerate aspiration of the knee today and total of 35 cc of slightly turbid yellow fluid was obtained.  Questions were encouraged and answered at length.    Follow-Up Instructions: Return in about 2 weeks (around 02/07/2021).   Orders:  Orders Placed This Encounter  Procedures   Gram stain   Body Fluid Culture   No orders of the defined types were placed in this encounter.     Procedures: No procedures performed   Clinical Data: No additional findings.   Subjective: Chief Complaint  Patient presents with   Left Knee - Pain    HPI Colleen Stone 26 year old female who is [redacted] weeks pregnant.  She is being monitored currently for high risk pregnancy.  She did have a history of gonorrhea in her first trimester.  The patient unfortunately felt a pop in her knee on 01/19/2021 then 2 days later fell down the stairs further injuring her knee.  She was seen in the ER where radiographs of the knee were obtained.  I personally reviewed these these show no acute fracture.  Knee is well located.Small suprapatellar effusion was present.  She denies any history of knee pain or dislocations.  She states she is having difficulty bending the knee.  She is using knee immobilizer to ambulate feels the knee could give way on her.  She has tried rest and ice.  She was given tramadol but was afraid to use the tramadol due to the fact that she is pregnant. Review  of Systems See HPI  Objective: Vital Signs: LMP 07/25/2020   Physical Exam Constitutional:      Appearance: She is not ill-appearing or diaphoretic.  Pulmonary:     Effort: Pulmonary effort is normal.  Neurological:     Mental Status: She is alert and oriented to person, place, and time.  Psychiatric:        Mood and Affect: Mood normal.    Ortho Exam Bilateral knees right knee hyperextends.  Left knee she is able to bring to neutral.  Full range of motion of the right knee without pain.  Left knee she has limited flexion.  Tenderness along the lateral joint line.  Left knee with slight effusion.  No abnormal warmth erythema.  No gross instability valgus varus stressing the left knee.  Calf supple nontender.  Dorsiflexion plantarflexion left ankle intact.  Specialty Comments:  No specialty comments available.  Imaging: No results found.   PMFS History: Patient Active Problem List   Diagnosis Date Noted   [redacted] weeks gestation of pregnancy 01/23/2021   [redacted] weeks gestation of pregnancy 12/26/2020   BMI 45.0-49.9, adult (HCC) 12/26/2020   Group B streptococcal bacteriuria 11/28/2020   Alpha thalassemia silent carrier 11/28/2020  Supervision of high risk pregnancy, antepartum 10/05/2020   Gonorrhea affecting pregnancy in first trimester 09/13/2020   Hypertension    History of seizure disorder 06/15/2018   Chronic hypertension complicating or reason for care during pregnancy, second trimester 05/07/2018   Bipolar 1 disorder (HCC) 01/13/2018   Family history of schizophrenia 01/13/2018   Smoker 01/13/2018   Atypical squamous cells of undetermined significance (ASCUS) on Papanicolaou smear of cervix 01/26/2013   Family history of breast cancer 08/31/2012   Past Medical History:  Diagnosis Date   Anxiety    Asthma    Chlamydia    Gonorrhea    Hypertension    takes labetalol   MRSA infection    8th grade   Other acne 06/30/2008   Overview:  Acne   PCOS (polycystic ovarian  syndrome)    Seizures (HCC)    Last seizure in 2017   Trichomonas infection     Family History  Problem Relation Age of Onset   Asthma Mother    Heart disease Mother        murmur   Hypertension Mother    Heart disease Father     Past Surgical History:  Procedure Laterality Date   ADENOIDECTOMY     NASAL SEPTOPLASTY W/ TURBINOPLASTY     ROOT CANAL     TONSILLECTOMY     WISDOM TOOTH EXTRACTION     Social History   Occupational History   Not on file  Tobacco Use   Smoking status: Former    Types: Cigarettes    Quit date: 10/03/2017    Years since quitting: 3.3   Smokeless tobacco: Never  Vaping Use   Vaping Use: Former  Substance and Sexual Activity   Alcohol use: Not Currently    Comment: not since pregnancy   Drug use: Not Currently    Types: Marijuana    Comment: Last marijuana 09/08/20   Sexual activity: Yes    Partners: Male    Birth control/protection: None

## 2021-01-25 LAB — GRAM STAIN
GRAM STAIN:: NONE SEEN
MICRO NUMBER:: 12857168
SPECIMEN QUALITY:: ADEQUATE

## 2021-01-26 IMAGING — US US FETAL BPP W/NONSTRESS
1 series · 13 of 14 positions shown · non-contrast
Comparison: none

[Series 1: us fetal bpp w/nonstress · 14 acquisitions, 13 frames shown]
[im 1/14]
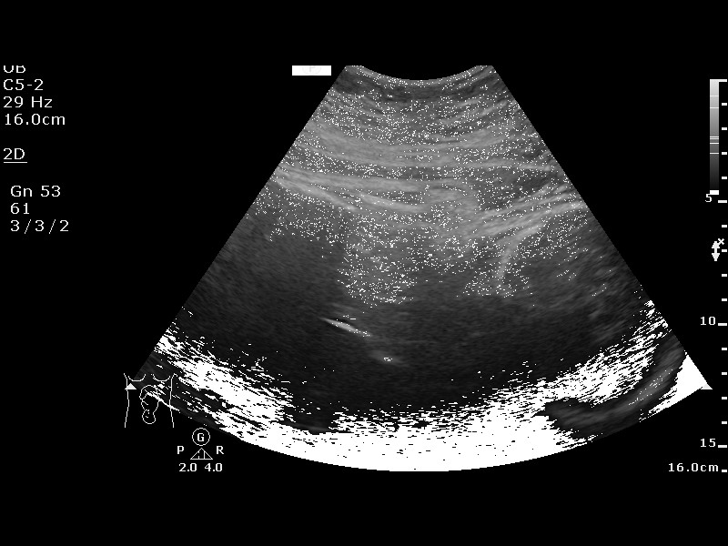
[im 2/14]
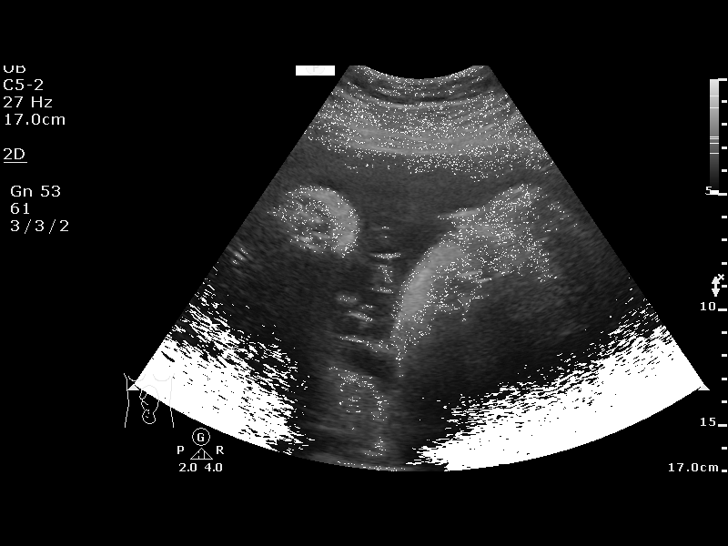
[im 3/14]
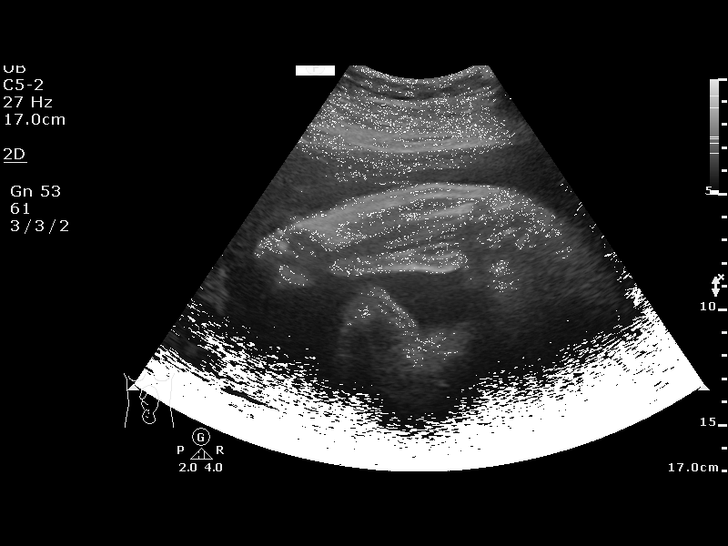
[im 4/14]
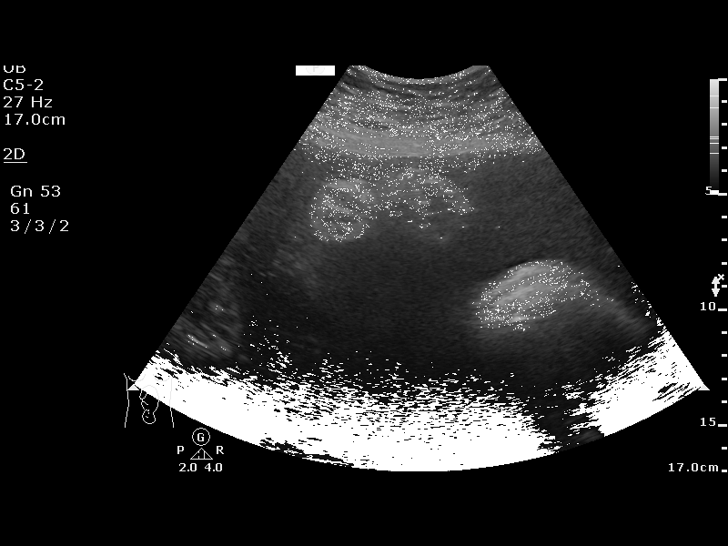
[im 5/14]
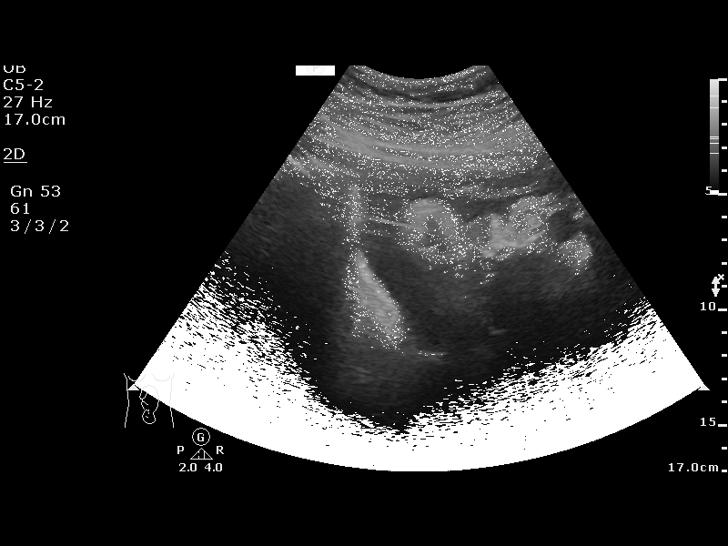
[im 6/14]
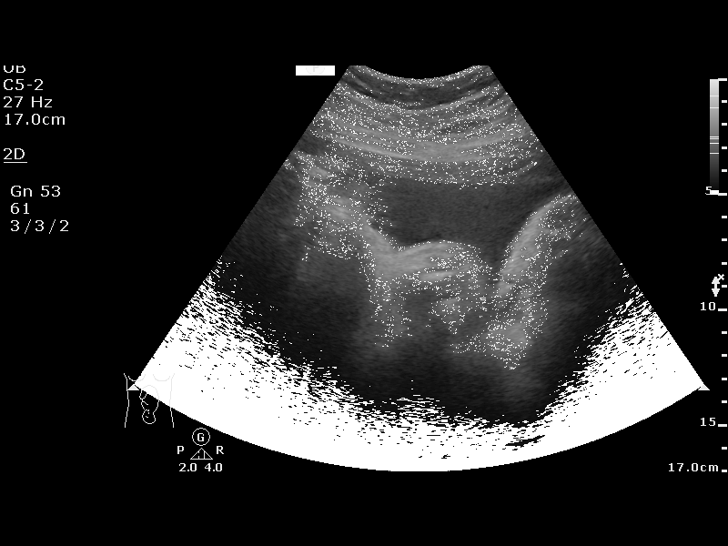
[im 8/14]
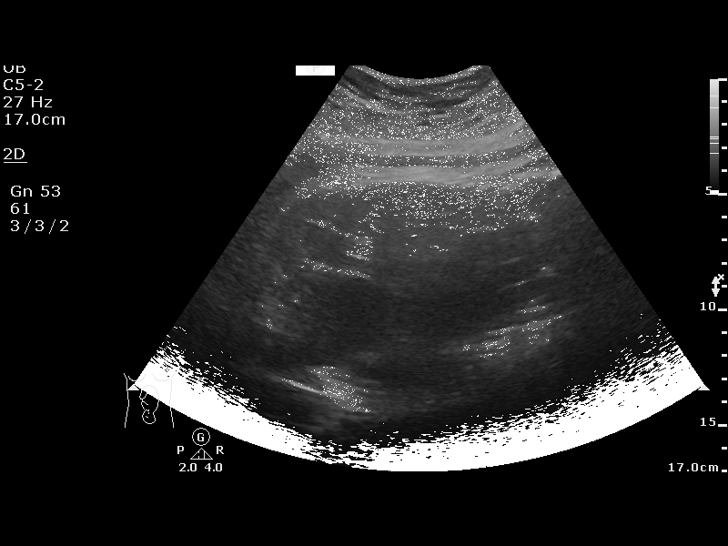
[im 9/14]
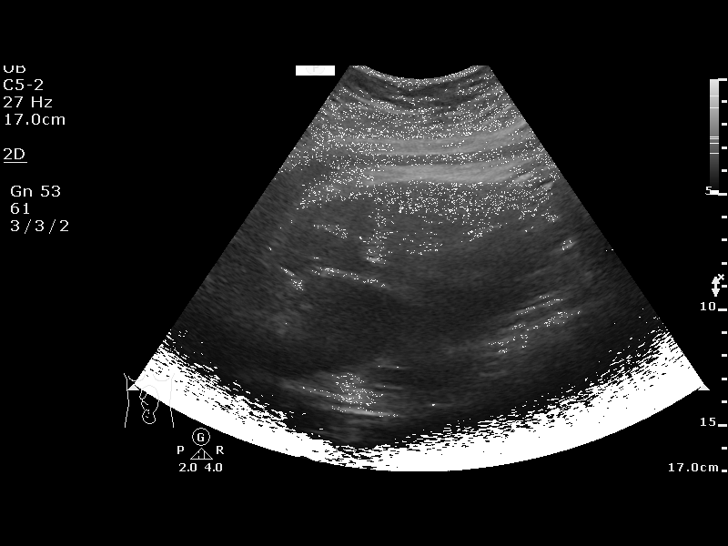
[im 10/14]
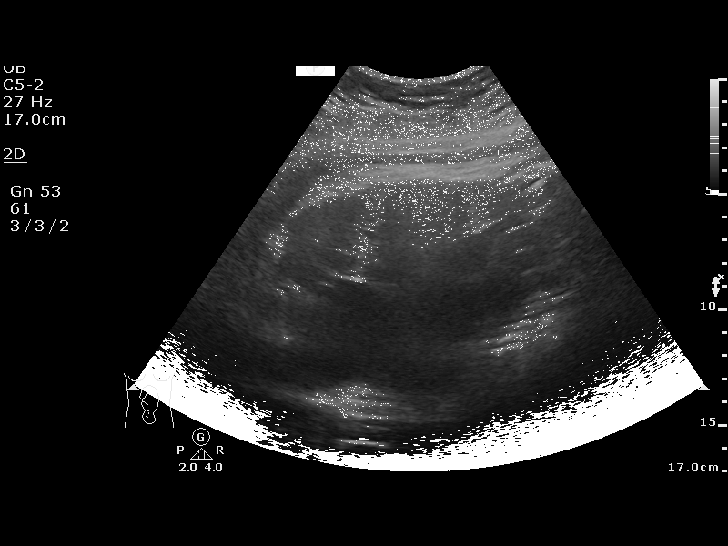
[im 11/14]
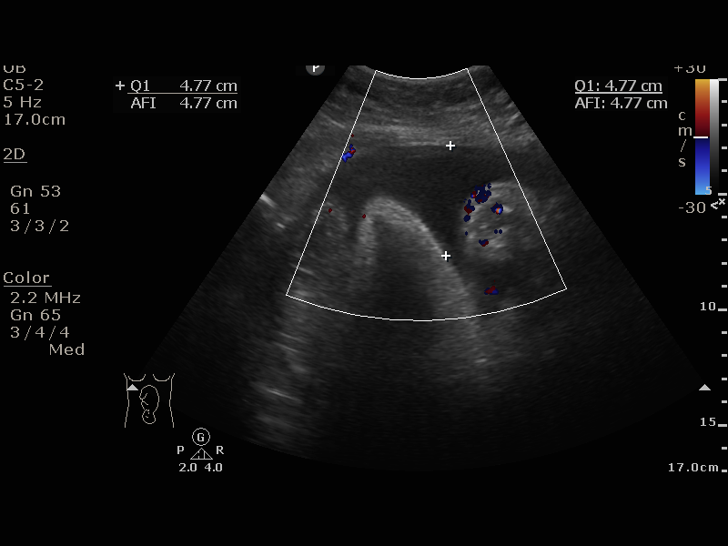
[im 12/14]
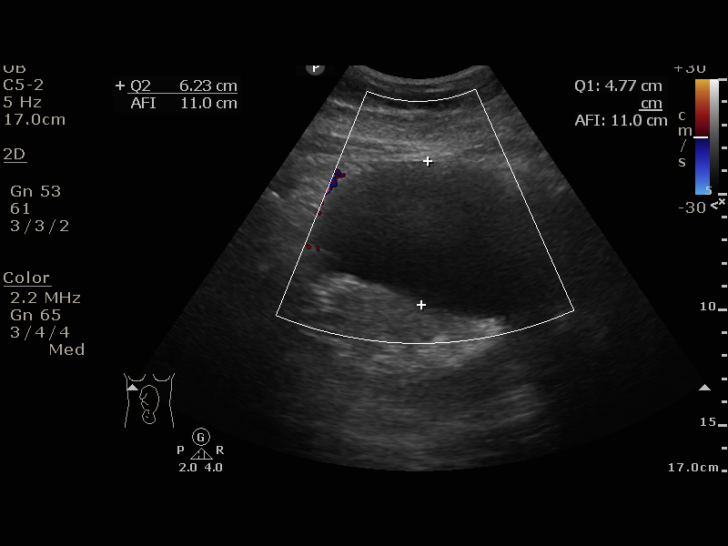
[im 13/14]
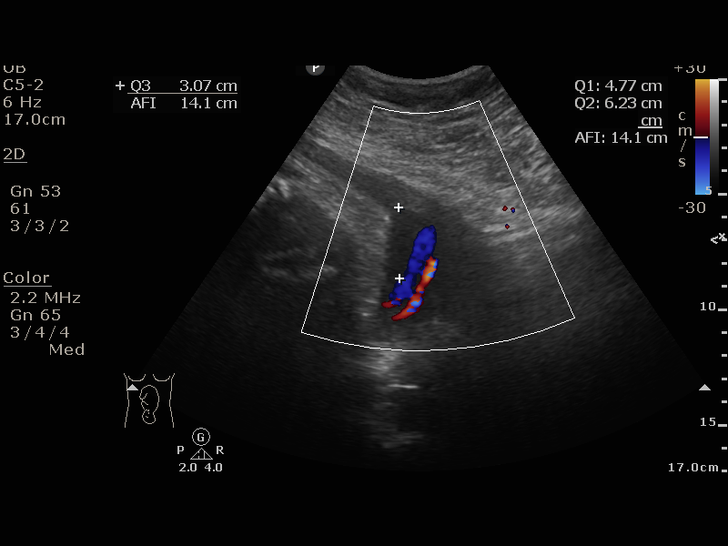
[im 14/14]
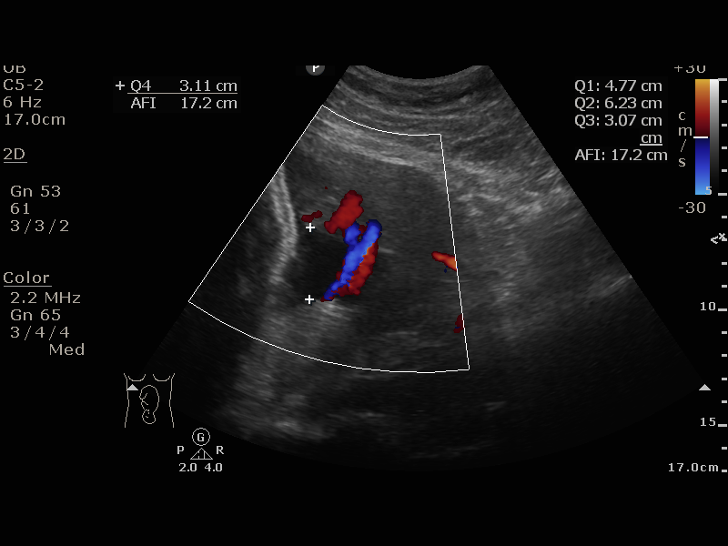

[13 of 14 positions shown; findings below may reference images not displayed]

Suite [REDACTED]

  1  US FETAL BPP W/NONSTRESS              76818.4      FABIENNE URQUILLA
 ----------------------------------------------------------------------

 ----------------------------------------------------------------------
Service(s) Provided

 ----------------------------------------------------------------------
Indications

  38 weeks gestation of pregnancy
  Hypertension - Chronic/Pre-existing
 ----------------------------------------------------------------------
Vital Signs

                                                 Height:        5'4"
Fetal Evaluation

 Num Of Fetuses:          1
 Preg. Location:          Intrauterine
 Cardiac Activity:        Observed
 Presentation:            Cephalic

 Amniotic Fluid
 AFI FV:      Within normal limits

 AFI Sum(cm)     %Tile       Largest Pocket(cm)
 17.18           67

 RUQ(cm)       RLQ(cm)        LUQ(cm)        LLQ(cm)

Biophysical Evaluation

 Amniotic F.V:   Pocket => 2 cm two          F. Tone:         Observed
                 planes
 F. Movement:    Observed                    N.S.T:           Reactive
 F. Breathing:   Observed                    Score:           [DATE]
OB History

 Gravidity:     2         Term:  0          Prem:  0        SAB:   1
 TOP:           0       Ectopic: 0         Living: 0
Gestational Age

 LMP:            43w 6d       Date:  08/26/17                   EDD:  06/02/18
 Best:           38w 1d    Det. By:  Early Ultrasound           EDD:  07/12/18
                                     (11/20/17)
Impression

 Vertex
Recommendations

 Continue with antenatal testing as indicated

## 2021-01-28 ENCOUNTER — Ambulatory Visit: Payer: Medicaid Other

## 2021-01-28 ENCOUNTER — Encounter: Payer: Self-pay | Admitting: Emergency Medicine

## 2021-01-28 ENCOUNTER — Other Ambulatory Visit: Payer: Self-pay

## 2021-01-28 ENCOUNTER — Ambulatory Visit
Admission: EM | Admit: 2021-01-28 | Discharge: 2021-01-28 | Disposition: A | Discharge status: Home / Self Care | Payer: Medicaid Other | Attending: Internal Medicine | Admitting: Internal Medicine

## 2021-01-28 DIAGNOSIS — L03317 Cellulitis of buttock: Secondary | ICD-10-CM

## 2021-01-28 DIAGNOSIS — L03012 Cellulitis of left finger: Secondary | ICD-10-CM | POA: Diagnosis not present

## 2021-01-28 MED ORDER — CEPHALEXIN 500 MG PO TABS: 500.0000 mg | ORAL_TABLET | Freq: Four times a day (QID) | ORAL | 0 refills | Status: AC

## 2021-01-28 MED ORDER — MUPIROCIN CALCIUM 2 % EX CREA: 1.0000 "application " | TOPICAL_CREAM | Freq: Two times a day (BID) | CUTANEOUS | 0 refills | Status: DC

## 2021-01-28 MED ORDER — MUPIROCIN 2 % EX OINT: 1.0000 "application " | TOPICAL_OINTMENT | Freq: Two times a day (BID) | CUTANEOUS | 0 refills | Status: DC

## 2021-01-28 NOTE — ED Provider Notes (Addendum)
[name]    [name]: [id number] Arrival date & time: 01/28/21  4765      History   Chief Complaint Chief Complaint  Patient presents with   Finger infection    HPI Colleen Stone is a 26 y.o. female.   Patient presents with two different chief complaints today.  She reports that she has had some redness and swelling located to skin surrounding fingernail of left second digit that started yesterday.  Denies any apparent injury but she does report that she bites her fingernails in her sleep.  Denies any numbness or tingling to the area.  She also reports that she has some bumps, peeling, drainage to her buttocks area that has been present for a few days as well.  Patient reports that she has had this before to her buttocks and it was "impetigo" when she was a child.  She has used some nystatin powder with no improvement.  Denies any fevers, chills, body aches.    Past Medical History:  Diagnosis Date   Anxiety    Asthma    Chlamydia    Gonorrhea    Hypertension    takes labetalol   MRSA infection    8th grade   Other acne 06/30/2008   Overview:  Acne   PCOS (polycystic ovarian syndrome)    Seizures (HCC)    Last seizure in 2017   Trichomonas infection     Patient Active Problem List   Diagnosis Date Noted   [redacted] weeks gestation of pregnancy 01/23/2021   [redacted] weeks gestation of pregnancy 12/26/2020   BMI 45.0-49.9, adult (HCC) 12/26/2020   Group B streptococcal bacteriuria 11/28/2020   Alpha thalassemia silent carrier 11/28/2020   Supervision of high risk pregnancy, antepartum 10/05/2020   Gonorrhea affecting pregnancy in first trimester 09/13/2020   Hypertension    History of seizure disorder 06/15/2018   Chronic hypertension complicating or reason for care during pregnancy, second trimester 05/07/2018   Bipolar 1 disorder (HCC) 01/13/2018   Family history of schizophrenia 01/13/2018   Smoker 01/13/2018   Atypical squamous cells of undetermined  significance (ASCUS) on Papanicolaou smear of cervix 01/26/2013   Family history of breast cancer 08/31/2012    Past Surgical History:  Procedure Laterality Date   ADENOIDECTOMY     NASAL SEPTOPLASTY W/ TURBINOPLASTY     ROOT CANAL     TONSILLECTOMY     WISDOM TOOTH EXTRACTION      OB History     Gravida  3   Para  1   Term  1   Preterm      AB  1   Living  1      SAB  1   IAB      Ectopic      Multiple  0   Live Births  1            Home Medications    Prior to Admission medications   Medication Sig Start Date End Date Taking? Authorizing Provider  albuterol (PROVENTIL) (2.5 MG/3ML) 0.083% nebulizer solution Take 3 mLs (2.5 mg total) by nebulization every 6 (six) hours as needed for wheezing or shortness of breath. 08/05/18  Yes Fulp, Cammie, MD  albuterol (VENTOLIN HFA) 108 (90 Base) MCG/ACT inhaler Inhale 2 puffs into the lungs every 6 (six) hours as needed for wheezing or shortness of breath. 08/05/18  Yes Fulp, Cammie, MD  aspirin EC 81 MG tablet Take 1 tablet (81 mg total) by mouth daily.  Take after 12 weeks for prevention of preeclampsia later in pregnancy 11/28/20  Yes Conan Bowens, MD  butalbital-acetaminophen-caffeine [facility]) [phone] MG tablet Take 1-2 tablets by mouth every 6 (six) hours as needed for headache. 01/03/21 01/03/22 Yes Hermina Staggers, MD  Cephalexin 500 MG tablet Take 1 tablet (500 mg total) by mouth 4 (four) times daily for 7 days. 01/28/21 02/04/21 Yes , Acie Fredrickson, FNP  clotrimazole (LOTRIMIN) 1 % cream Apply to affected area 2 times daily 01/20/21  Yes Evern Core, PA-C  famotidine (PEPCID) 20 MG tablet Take 1 tablet (20 mg total) by mouth 2 (two) times daily as needed for heartburn or indigestion. 11/09/20  Yes Leftwich-Kirby, Wilmer Floor, CNM  labetalol (NORMODYNE) 100 MG tablet TAKE 1 TABLET(100 MG) BY MOUTH TWICE DAILY 11/20/20  Yes Warden Fillers, MD  Misc. Devices (GOJJI WEIGHT SCALE) MISC 1 Device by Does not apply  route once a week. 10/05/20  Yes Adam Phenix, MD  mupirocin cream (BACTROBAN) 2 % Apply 1 application topically 2 (two) times daily. 01/28/21  Yes , Rolly Salter E, FNP  nystatin (MYCOSTATIN/NYSTOP) powder Apply 1 application topically 3 (three) times daily. 01/20/21  Yes Evern Core, PA-C  ondansetron (ZOFRAN ODT) 8 MG disintegrating tablet Take 1 tablet (8 mg total) by mouth every 8 (eight) hours as needed for nausea or vomiting. 09/09/20  Yes Marylene Land, CNM  pantoprazole (PROTONIX) 40 MG tablet Take 1 tablet (40 mg total) by mouth daily. 11/09/20  Yes Leftwich-Kirby, Wilmer Floor, CNM  Prenatal Vit-Fe Phos-FA-Omega (VITAFOL GUMMIES) 3.33-0.333-34.8 MG CHEW Chew 3 tablets by mouth daily. 10/05/20  Yes Adam Phenix, MD  terconazole (TERAZOL 7) 0.4 % vaginal cream Place 1 applicator vaginally at bedtime. 01/21/21  Yes Leftwich-Kirby, Wilmer Floor, CNM  traMADol (ULTRAM) 50 MG tablet Take 1-2 tablets (50-100 mg total) by mouth every 6 (six) hours as needed. 01/21/21  Yes Leftwich-Kirby, Wilmer Floor, CNM  cetirizine (ZYRTEC ALLERGY) 10 MG tablet Take 1 tablet (10 mg total) by mouth 2 (two) times daily. 06/21/19 06/25/19  Muthersbaugh, Dahlia Client, PA-C    Family History Family History  Problem Relation Age of Onset   Asthma Mother    Heart disease Mother        murmur   Hypertension Mother    Heart disease Father     Social History Social History   Tobacco Use   Smoking status: Former    Types: Cigarettes    Quit date: 10/03/2017    Years since quitting: 3.3   Smokeless tobacco: Never  Vaping Use   Vaping Use: Former  Substance Use Topics   Alcohol use: Not Currently    Comment: not since pregnancy   Drug use: Not Currently    Types: Marijuana    Comment: Last marijuana 09/08/20     Allergies   Red dye and Latex   Review of Systems Review of Systems Per HPI  Physical Exam Triage Vital Signs ED Triage Vitals  Enc Vitals Group     BP 01/28/21 1000 128/86     Pulse Rate 01/28/21  1000 (!) 101     Resp 01/28/21 1000 18     Temp 01/28/21 1000 98.2 F (36.8 C)     Temp Source 01/28/21 1000 Oral     SpO2 01/28/21 1000 99 %     Weight 01/28/21 1002 296 lb (134.3 kg)     Height 01/28/21 1002 5\' 5"  (1.651 m)     Head Circumference --  Peak Flow --      Pain Score 01/28/21 1002 9     Pain Loc --      Pain Edu? --      Excl. in GC? --    No data found.  Updated Vital Signs BP 128/86 (BP Location: Left Arm)    Pulse (!) 101    Temp 98.2 F (36.8 C) (Oral)    Resp 18    Ht 5\' 5"  (1.651 m)    Wt 296 lb (134.3 kg)    LMP 07/25/2020    SpO2 99%    BMI 49.26 kg/m   Visual Acuity Right Eye Distance:   Left Eye Distance:   Bilateral Distance:    Right Eye Near:   Left Eye Near:    Bilateral Near:     Physical Exam Constitutional:      General: She is not in acute distress.    Appearance: Normal appearance. She is not toxic-appearing or diaphoretic.  HENT:     Head: Normocephalic and atraumatic.  Eyes:     Extraocular Movements: Extraocular movements intact.     Conjunctiva/sclera: Conjunctivae normal.  Pulmonary:     Effort: Pulmonary effort is normal.  Skin:    Comments: Skin surrounding fingernail of left second digit is swollen and has yellow discoloration indicating that there is pus directly below the skin.  Swelling and erythema extends to distal end of finger.  Neurovascular intact.  She also has erythema with bumpy lesions noted to intergluteal cleft at the top portion.    Neurological:     General: No focal deficit present.     Mental Status: She is alert and oriented to person, place, and time. Mental status is at baseline.  Psychiatric:        Mood and Affect: Mood normal.        Behavior: Behavior normal.        Thought Content: Thought content normal.        Judgment: Judgment normal.     UC Treatments / Results  Labs (all labs ordered are listed, but only abnormal results are displayed) Labs Reviewed - No data to  display  EKG   Radiology No results found.  Procedures Procedures (including critical care time)  Medications Ordered in UC Medications - No data to display  Initial Impression / Assessment and Plan / UC Course  I have reviewed the triage vital signs and the nursing notes.  Pertinent labs & imaging results that were available during my care of the patient were reviewed by me and considered in my medical decision making (see chart for details).     Patient has paronychia to left second digit. It was drained in urgent care today with purulent drainage and success and relief of pressure per patient.  Will place on cephalexin antibiotic as this is safe with pregnancy.  Patient also to use warm Epson salt soaks.  It appears that patient has bacterial infection located to intergluteal cleft of buttocks as well.  Will treat with mupirocin ointment as this is safe with pregnancy as well.  Discussed return precautions.  Patient verbalized understanding and was agreeable with plan.  Patient does have red dye allergy.  Call pharmacy to confirm that cephalexin tablets do not have red dye, and pharmacist confirmed that this does not have red dye. Final Clinical Impressions(s) / UC Diagnoses   Final diagnoses:  Paronychia of finger, left  Cellulitis of buttock     Discharge Instructions  You have been prescribed an antibiotic pill for your finger and a cream for your buttocks. Also use warm epsom salt soaks to finger.     ED Prescriptions     Medication Sig Dispense Auth. Provider   mupirocin cream (BACTROBAN) 2 % Apply 1 application topically 2 (two) times daily. 15 g Ervin Knack, Khalani Novoa E, [place]   Cephalexin 500 MG tablet Take 1 tablet (500 mg total) by mouth 4 (four) times daily for 7 days. 28 tablet [place], Acie FredricksonHaley E, [place]      PDMP not reviewed this encounter.   Gustavus Bryant, Siona Coulston E, [place] 01/28/21 1100    [address] Talynn Lebon E, [place] 01/28/21 1100

## 2021-01-28 NOTE — ED Triage Notes (Signed)
Patient c/o an infection in her left index finger x 1 day.  The area around the nail is red and swollen, painful.    Patient is having some drainage, impetigo and peeling from her buttocks area.  The drainage is brown in color and has an odor to it.  Patient has been applying the Nystatin powder to the area but patient has run out of medication.

## 2021-01-28 NOTE — Discharge Instructions (Signed)
You have been prescribed an antibiotic pill for your finger and a cream for your buttocks. Also use warm epsom salt soaks to finger.

## 2021-01-30 LAB — BODY FLUID CULTURE

## 2021-02-06 ENCOUNTER — Other Ambulatory Visit: Payer: Medicaid Other

## 2021-02-06 ENCOUNTER — Telehealth: Payer: Medicaid Other | Admitting: Obstetrics & Gynecology

## 2021-02-07 ENCOUNTER — Other Ambulatory Visit: Payer: Medicaid Other

## 2021-02-07 ENCOUNTER — Other Ambulatory Visit: Payer: Self-pay

## 2021-02-07 ENCOUNTER — Ambulatory Visit: Payer: Medicaid Other | Admitting: Physician Assistant

## 2021-02-07 DIAGNOSIS — O0990 Supervision of high risk pregnancy, unspecified, unspecified trimester: Secondary | ICD-10-CM

## 2021-02-07 DIAGNOSIS — Z3A28 28 weeks gestation of pregnancy: Secondary | ICD-10-CM

## 2021-02-08 ENCOUNTER — Other Ambulatory Visit: Payer: Self-pay | Admitting: *Deleted

## 2021-02-08 LAB — CBC
Hematocrit: 32.9 % — ABNORMAL LOW (ref 34.0–46.6)
Hemoglobin: 10.9 g/dL — ABNORMAL LOW (ref 11.1–15.9)
MCH: 25.2 pg — ABNORMAL LOW (ref 26.6–33.0)
MCHC: 33.1 g/dL (ref 31.5–35.7)
MCV: 76 fL — ABNORMAL LOW (ref 79–97)
Platelets: 335 10*3/uL (ref 150–450)
RBC: 4.33 x10E6/uL (ref 3.77–5.28)
RDW: 14.9 % (ref 11.7–15.4)
WBC: 10.2 10*3/uL (ref 3.4–10.8)

## 2021-02-08 LAB — GLUCOSE TOLERANCE, 2 HOURS W/ 1HR
Glucose, 1 hour: 115 mg/dL (ref 70–179)
Glucose, 2 hour: 106 mg/dL (ref 70–152)
Glucose, Fasting: 88 mg/dL (ref 70–91)

## 2021-02-08 LAB — HIV ANTIBODY (ROUTINE TESTING W REFLEX): HIV Screen 4th Generation wRfx: NONREACTIVE

## 2021-02-08 LAB — RPR: RPR Ser Ql: NONREACTIVE

## 2021-02-12 ENCOUNTER — Other Ambulatory Visit: Payer: Self-pay

## 2021-02-12 ENCOUNTER — Telehealth (INDEPENDENT_AMBULATORY_CARE_PROVIDER_SITE_OTHER): Payer: Medicaid Other | Admitting: Advanced Practice Midwife

## 2021-02-12 ENCOUNTER — Encounter: Payer: Self-pay | Admitting: Advanced Practice Midwife

## 2021-02-12 VITALS — BP 125/90 | HR 120

## 2021-02-12 DIAGNOSIS — O219 Vomiting of pregnancy, unspecified: Secondary | ICD-10-CM

## 2021-02-12 DIAGNOSIS — A5901 Trichomonal vulvovaginitis: Secondary | ICD-10-CM

## 2021-02-12 DIAGNOSIS — O23592 Infection of other part of genital tract in pregnancy, second trimester: Secondary | ICD-10-CM

## 2021-02-12 DIAGNOSIS — O23593 Infection of other part of genital tract in pregnancy, third trimester: Secondary | ICD-10-CM

## 2021-02-12 DIAGNOSIS — O98212 Gonorrhea complicating pregnancy, second trimester: Secondary | ICD-10-CM

## 2021-02-12 DIAGNOSIS — O10912 Unspecified pre-existing hypertension complicating pregnancy, second trimester: Secondary | ICD-10-CM

## 2021-02-12 DIAGNOSIS — K219 Gastro-esophageal reflux disease without esophagitis: Secondary | ICD-10-CM

## 2021-02-12 DIAGNOSIS — O98213 Gonorrhea complicating pregnancy, third trimester: Secondary | ICD-10-CM

## 2021-02-12 DIAGNOSIS — O10913 Unspecified pre-existing hypertension complicating pregnancy, third trimester: Secondary | ICD-10-CM

## 2021-02-12 DIAGNOSIS — O0993 Supervision of high risk pregnancy, unspecified, third trimester: Secondary | ICD-10-CM

## 2021-02-12 DIAGNOSIS — O0990 Supervision of high risk pregnancy, unspecified, unspecified trimester: Secondary | ICD-10-CM

## 2021-02-12 DIAGNOSIS — O99613 Diseases of the digestive system complicating pregnancy, third trimester: Secondary | ICD-10-CM

## 2021-02-12 DIAGNOSIS — Z3A28 28 weeks gestation of pregnancy: Secondary | ICD-10-CM

## 2021-02-12 MED ORDER — PANTOPRAZOLE SODIUM 40 MG PO TBEC: 40.0000 mg | DELAYED_RELEASE_TABLET | Freq: Every day | ORAL | 5 refills | Status: DC

## 2021-02-12 MED ORDER — PROMETHAZINE HCL 12.5 MG PO TABS: 12.5000 mg | ORAL_TABLET | Freq: Four times a day (QID) | ORAL | 3 refills | Status: DC | PRN

## 2021-02-12 NOTE — Progress Notes (Signed)
Virtual Visit via Telephone Note  I connected with Colleen Stone on 02/12/21 at  3:30 PM EST by telephone and verified that I am speaking with the correct person using two identifiers.  Location: Patient: home Provider: [facility]   Pt c/o acid reflux- no relief with Protonix per pt.  Pt also c/o HA's.

## 2021-02-12 NOTE — Progress Notes (Signed)
OBSTETRICS PRENATAL VIRTUAL VISIT ENCOUNTER NOTE  Provider location: [facility]   Patient location: Home  I connected with Colleen Stone on 02/12/21 at  3:30 PM EST by MyChart Video Encounter and verified that I am speaking with the correct person using two identifiers. I discussed the limitations, risks, security and privacy concerns of performing an evaluation and management service virtually and the availability of in person appointments. I also discussed with the patient that there may be a patient responsible charge related to this service. The patient expressed understanding and agreed to proceed. Subjective:  Colleen Stone is a 26 y.o. G3P1011 at [redacted]w[redacted]d being seen today for ongoing prenatal care.  She is currently monitored for the following issues for this high-risk pregnancy and has Bipolar 1 disorder (HCC); Family history of breast cancer; Family history of schizophrenia; Smoker; Chronic hypertension complicating or reason for care during pregnancy, second trimester; History of seizure disorder; Hypertension; Atypical squamous cells of undetermined significance (ASCUS) on Papanicolaou smear of cervix; Gonorrhea affecting pregnancy in first trimester; Supervision of high risk pregnancy, antepartum; Group B streptococcal bacteriuria; Alpha thalassemia silent carrier; [redacted] weeks gestation of pregnancy; BMI 45.0-49.9, adult (HCC); and [redacted] weeks gestation of pregnancy on their problem list.  Patient reports nausea and vomiting.  Contractions: Irritability. Vag. Bleeding: None.  Movement: Present. Denies any leaking of fluid.   The following portions of the patient's history were reviewed and updated as appropriate: allergies, current medications, past family history, past medical history, past social history, past surgical history and problem list.   Objective:   Vitals:   02/12/21 1540 02/12/21 1547  BP: (!) 140/98 125/90  Pulse: (!) 120     Fetal  Status:     Movement: Present     General:  Alert, oriented and cooperative. Patient is in no acute distress.  Respiratory: Normal respiratory effort, no problems with respiration noted  Mental Status: Normal mood and affect. Normal behavior. Normal judgment and thought content.  Rest of physical exam deferred due to type of encounter  Imaging: DG Knee Complete 4 Views Left  Result Date: 01/21/2021 CLINICAL DATA:  Left knee pain after injury.  Fell down stairs. EXAM: LEFT KNEE - COMPLETE 4+ VIEW COMPARISON:  None. FINDINGS: Small suprapatellar joint effusion. No signs of underlying fracture or dislocation. IMPRESSION: Small suprapatellar joint effusion. Electronically Signed   By: Signa Kell M.D.   On: 01/21/2021 13:20   [place] MFM OB FOLLOW UP  Result Date: 01/19/2021 ----------------------------------------------------------------------  OBSTETRICS REPORT                       (Signed Final 01/19/2021 05:10 pm) ---------------------------------------------------------------------- Patient Info  ID #:       [id number]                          D.O.B.:  12/06/1995 (26 yrs)  Name:       Colleen Stone               Visit Date: 01/19/2021 04:21 pm ---------------------------------------------------------------------- Performed By  Attending:        Noralee Space MD        Referred By:      Bing Neighbors  HARPER  Performed By:     Aundra Millet            [facility] ---------------------------------------------------------------------- Orders  #  Description                           Code        Ordered By  1  [place] MFM OB FOLLOW UP                   76816.01    RAVI [facility]  ----------------------------------------------------------------------  #  Order #                     Accession #                Episode #  1  [id number]                   [id number]                 [id number] ---------------------------------------------------------------------- Indications  Obesity complicating pregnancy, second         O99.212  trimester (BMI 49)  [redacted] weeks gestation of pregnancy                Z3A.25  Echogenic intracardiac focus of the heart      O35.8XX0  (EIF)  Pre-existing essential hypertension            O10.012  complicating pregnancy, second trimester  (on labetalol)  Seizure disorder                               O99.350 G40.909  Genetic carrier (alpha thal silent carrier)    Z14.8  LR NIPS ---------------------------------------------------------------------- Fetal Evaluation  Num Of Fetuses:         1  Fetal Heart Rate(bpm):  133  Cardiac Activity:       Observed  Presentation:           Cephalic  Placenta:               Posterior  P. Cord Insertion:      Previously Visualized  Amniotic Fluid  AFI FV:      Within normal limits  Largest Pocket(cm)                              4.7 ---------------------------------------------------------------------- Biometry  BPD:      60.4  mm     G. Age:  24w 4d         16  %    CI:         72.4   %    70 - 86                                                          FL/HC:      21.1   %    18.7 - 20.3  HC:      225.8  mm     G. Age:  24w 4d          8  %    HC/AC:      1.08        1.04 - 1.22  AC:      209.5  mm     G. Age:  25w 3d         43  %    FL/BPD:     78.8   %    71 - 87  FL:       47.6  mm     G. Age:  25w 6d         51  %    FL/AC:      22.7   %    20 - 24  Est. FW:     818  gm    1 lb 13 oz      42  % ---------------------------------------------------------------------- OB History  Gravidity:    3         Term:   1 ---------------------------------------------------------------------- Gestational Age  LMP:            25w 3d        Date:  07/25/20                 EDD:   05/01/21  U/S Today:     25w 1d                                        EDD:   05/03/21  Best:          25w 3d     Det. By:  LMP  (07/25/20)          EDD:   05/01/21 ---------------------------------------------------------------------- Anatomy  Cranium:               Previously seen        LVOT:                   Previously seen  Cavum:                 Previously seen        Aortic Arch:            Appears normal  Ventricles:            Appears normal  Ductal Arch:            Appears normal  Choroid Plexus:        Previously seen        Diaphragm:              Appears normal  Cerebellum:            Previously seen        Stomach:                Appears normal, left                                                                        sided  Posterior Fossa:       Previously seen        Abdomen:                Appears normal  Nuchal Fold:           Previously seen        Abdominal Wall:         Previously seen  Face:                  Orbits and profile     Cord Vessels:           Previously seen                         previously seen  Lips:                  Appears normal         Kidneys:                Appear normal  Palate:                Not well visualized    Bladder:                Appears normal  Thoracic:              Previously seen        Spine:                  Previously seen  Heart:                 Appears normal         Upper Extremities:      Previously seen                         (4CH, axis, and                         situs)  RVOT:                  Appears normal         Lower Extremities:      Previously seen  Other:  Fetus appears to be female, Nasal bone, lenses, 3VV previously          visualized. Technically difficult due to maternal habitus and fetal          position. Hands  and Heels visualized. ---------------------------------------------------------------------- Cervix Uterus Adnexa  Cervix  Not visualized (advanced GA  >24wks) ---------------------------------------------------------------------- Impression  Patient returned for completion of fetal anatomy.  An  echogenic intracardiac focus was seen at previous ultrasound.  She has chronic hypertension that is well controlled on  labetalol.  Blood pressure today at her office is 114/64 mmHg.  Fetal biometry is consistent with her previously-established  dates .Amniotic fluid is normal and good fetal activity is seen .  Fetal anatomical survey was completed and appears normal.  Patient reports she had her first episode of seizure when she  was a teenager.  She was seen by her neurologist in  [place] and focal epilepsy was diagnosed.  Her last  episode was in the first trimester.  She has not been taking  anticonvulsants for many years (previously took Depakote).  I encouraged her to see a neurologist. ---------------------------------------------------------------------- Recommendations  -An appointment was made for her to return in 4 weeks for  fetal growth assessment.  -Neurology consultation. ----------------------------------------------------------------------                  Noralee Space, MD Electronically Signed Final Report   01/19/2021 05:10 pm ----------------------------------------------------------------------  [place] MFM OB Limited  Result Date: 01/21/2021 ----------------------------------------------------------------------  OBSTETRICS REPORT                       (Signed Final 01/21/2021 04:33 pm) ---------------------------------------------------------------------- Patient Info  ID #:       [id number]                          D.O.B.:  [birth year] (26 yrs)  Name:       Colleen Stone               Visit Date: 01/21/2021 01:56 pm ---------------------------------------------------------------------- Performed By  Attending:        Noralee Space MD        Secondary Phy.:    Belicia Difatta A LEFTWICH-                                                              KIRBY CNM   Performed By:     Birdena Crandall        Address:           [address]                                                              [address]                                                              [place]  Referred By:      Bing Neighbors  [facility] ---------------------------------------------------------------------- Orders  #  Description                           Code        Ordered By  1  [place] MFM OB LIMITED                     25956.38    Cass Vandermeulen LEFTWICH-                                                       KIRBY ----------------------------------------------------------------------  #  Order #                     Accession #                Episode #  1  [id number]                   [id number]                 [id number] ---------------------------------------------------------------------- Indications  Traumatic injury during pregnancy               O9A.219 T14.90  Obesity complicating pregnancy, second          O99.212  trimester (BMI 49)  Echogenic intracardiac focus of the heart       O35.8XX0  (EIF)  Pre-existing essential hypertension             O10.012  complicating pregnancy, second trimester  (on labetalol)  Seizure disorder                                O99.350 G40.909  Genetic carrier (alpha thal silent carrier)     Z14.8  LR NIPS  [redacted] weeks gestation of pregnancy                 Z3A.25 ---------------------------------------------------------------------- Fetal Evaluation  Num Of Fetuses:          1  Fetal Heart Rate(bpm):   126  Cardiac Activity:        Observed  Presentation:            Cephalic  Placenta:                Posterior  P. Cord Insertion:       Previously Visualized  Amniotic Fluid  AFI FV:      Within normal limits                              Largest Pocket(cm)                              5.  Comment:  No placental abruption seen.  ---------------------------------------------------------------------- OB History  Gravidity:    3         Term:   1 ---------------------------------------------------------------------- Gestational Age  LMP:           25w 5d        Date:  07/25/20                 EDD:   05/01/21  Best:          25w 5d     Det. By:  LMP  (07/25/20)          EDD:   05/01/21 ---------------------------------------------------------------------- Anatomy  Cranium:               Appears normal         Cerebellum:             Appears normal  Cavum:                 Appears normal         Posterior Fossa:        Appears normal  Ventricles:            Appears normal ---------------------------------------------------------------------- Cervix Uterus Adnexa  Cervix  Length:            4.4  cm.  Normal appearance by transabdominal scan.  Uterus  No abnormality visualized.  Right Ovary  Within normal limits.  Left Ovary  Within normal limits. ---------------------------------------------------------------------- Impression  Patient was evaluated at the MAU after left knee injury.  A limited ultrasound study was performed .Amniotic fluid is  normal and good fetal activity is seen. Placenta looks normal  with no evidence of abruption. Ultrasound has limitations in  diagnosing placental abruption . ----------------------------------------------------------------------                  Noralee Space, MD Electronically Signed Final Report   01/21/2021 04:33 pm ----------------------------------------------------------------------   Assessment and Plan:  Pregnancy: G3P1011 at [redacted]w[redacted]d 1. Supervision of high risk pregnancy, antepartum --Pt reports good fetal movement, denies cramping, LOF, or vaginal bleeding  --Anticipatory guidance about next visits/weeks of pregnancy given.   2. Chronic hypertension complicating or reason for care during pregnancy, second trimester --BP today borderline 140s/90s --No s/sx of PEC, reviewed reasons to seek  care --Encouraged pt to take weekly BPs and enter into BabyRx  3. Gonorrhea affecting pregnancy, antepartum, second trimester --Tx x 3, TOC neg 12/13 and 1/8.  4. Trichomonal vaginitis during pregnancy, antepartum, second trimester --Neg wet prep at hospital, discussed with pt, will do TOC at next office visit to be more accurate  5. [redacted] weeks gestation of pregnancy  6. Gastroesophageal reflux disease without esophagitis --Renew Rx at pt request - pantoprazole (PROTONIX) 40 MG tablet; Take 1 tablet (40 mg total) by mouth daily.  Dispense: 30 tablet; Refill: 5  7. Nausea/vomiting in pregnancy --NV in last 2 weeks, cannot keep food down.   - promethazine (PHENERGAN) 12.5 MG tablet; Take 1-2 tablets (12.5-25 mg total) by mouth every 6 (six) hours as needed for nausea or vomiting.  Dispense: 30 tablet; Refill: 3   Preterm labor symptoms and general obstetric precautions including but not limited to vaginal bleeding, contractions, leaking of fluid and fetal movement were reviewed in detail with the patient. I discussed the assessment and treatment plan with the patient. The patient was provided an opportunity to ask questions and all were answered. The patient agreed with the plan and demonstrated  an understanding of the instructions. The patient was advised to call back or seek an in-person office evaluation/go to MAU at [facility] for any urgent or concerning symptoms. Please refer to After Visit Summary for other counseling recommendations.   I provided 10 minutes of face-to-face time during this encounter.  No follow-ups on file.  Future Appointments  Date Time Provider Department Center  02/16/2021  8:45 AM [facility] NURSE [facility]  02/16/2021  9:00 AM [facility] US1 [facility]    Sharen Counter, CNM [facility] [employer], [facility]

## 2021-02-16 ENCOUNTER — Ambulatory Visit: Payer: Medicaid Other | Admitting: *Deleted

## 2021-02-16 ENCOUNTER — Other Ambulatory Visit: Payer: Self-pay

## 2021-02-16 ENCOUNTER — Ambulatory Visit: Payer: Medicaid Other | Attending: Obstetrics and Gynecology

## 2021-02-16 ENCOUNTER — Other Ambulatory Visit: Payer: Self-pay | Admitting: *Deleted

## 2021-02-16 VITALS — BP 125/78 | HR 115

## 2021-02-16 DIAGNOSIS — O0990 Supervision of high risk pregnancy, unspecified, unspecified trimester: Secondary | ICD-10-CM | POA: Insufficient documentation

## 2021-02-16 DIAGNOSIS — Z3689 Encounter for other specified antenatal screening: Secondary | ICD-10-CM

## 2021-02-16 DIAGNOSIS — Z6841 Body Mass Index (BMI) 40.0 and over, adult: Secondary | ICD-10-CM

## 2021-02-16 DIAGNOSIS — O10912 Unspecified pre-existing hypertension complicating pregnancy, second trimester: Secondary | ICD-10-CM

## 2021-02-16 DIAGNOSIS — G40909 Epilepsy, unspecified, not intractable, without status epilepticus: Secondary | ICD-10-CM

## 2021-02-16 DIAGNOSIS — R638 Other symptoms and signs concerning food and fluid intake: Secondary | ICD-10-CM | POA: Diagnosis present

## 2021-02-16 DIAGNOSIS — O283 Abnormal ultrasonic finding on antenatal screening of mother: Secondary | ICD-10-CM

## 2021-02-16 DIAGNOSIS — O10913 Unspecified pre-existing hypertension complicating pregnancy, third trimester: Secondary | ICD-10-CM

## 2021-02-27 ENCOUNTER — Telehealth: Payer: Self-pay

## 2021-02-27 ENCOUNTER — Other Ambulatory Visit: Payer: Self-pay

## 2021-02-27 ENCOUNTER — Ambulatory Visit (INDEPENDENT_AMBULATORY_CARE_PROVIDER_SITE_OTHER): Payer: Medicaid Other | Admitting: Advanced Practice Midwife

## 2021-02-27 VITALS — BP 117/80 | HR 99 | Wt 286.0 lb

## 2021-02-27 DIAGNOSIS — F319 Bipolar disorder, unspecified: Secondary | ICD-10-CM

## 2021-02-27 DIAGNOSIS — R8271 Bacteriuria: Secondary | ICD-10-CM

## 2021-02-27 DIAGNOSIS — G43909 Migraine, unspecified, not intractable, without status migrainosus: Secondary | ICD-10-CM

## 2021-02-27 DIAGNOSIS — Z3A31 31 weeks gestation of pregnancy: Secondary | ICD-10-CM

## 2021-02-27 DIAGNOSIS — O98212 Gonorrhea complicating pregnancy, second trimester: Secondary | ICD-10-CM

## 2021-02-27 DIAGNOSIS — O0990 Supervision of high risk pregnancy, unspecified, unspecified trimester: Secondary | ICD-10-CM

## 2021-02-27 DIAGNOSIS — M7918 Myalgia, other site: Secondary | ICD-10-CM

## 2021-02-27 DIAGNOSIS — O99891 Other specified diseases and conditions complicating pregnancy: Secondary | ICD-10-CM

## 2021-02-27 DIAGNOSIS — O23592 Infection of other part of genital tract in pregnancy, second trimester: Secondary | ICD-10-CM

## 2021-02-27 DIAGNOSIS — O10912 Unspecified pre-existing hypertension complicating pregnancy, second trimester: Secondary | ICD-10-CM

## 2021-02-27 DIAGNOSIS — G40909 Epilepsy, unspecified, not intractable, without status epilepticus: Secondary | ICD-10-CM

## 2021-02-27 DIAGNOSIS — A5901 Trichomonal vulvovaginitis: Secondary | ICD-10-CM

## 2021-02-27 MED ORDER — BUTALBITAL-APAP-CAFFEINE 50-325-40 MG PO TABS: 1.0000 | ORAL_TABLET | Freq: Four times a day (QID) | ORAL | 0 refills | Status: DC | PRN

## 2021-02-27 MED ORDER — CYCLOBENZAPRINE HCL 5 MG PO TABS: 5.0000 mg | ORAL_TABLET | Freq: Three times a day (TID) | ORAL | 0 refills | Status: DC | PRN

## 2021-02-27 NOTE — Progress Notes (Signed)
PRENATAL VISIT NOTE  Subjective:  Colleen Stone is a 26 y.o. G3P1011 at 26w0dbeing seen today for ongoing prenatal care.  She is currently monitored for the following issues for this high-risk pregnancy and has Bipolar 1 disorder (HTok; Family history of breast cancer; Family history of schizophrenia; Smoker; Chronic hypertension complicating or reason for care during pregnancy, second trimester; History of seizure disorder; Hypertension; Atypical squamous cells of undetermined significance (ASCUS) on Papanicolaou smear of cervix; Gonorrhea affecting pregnancy in first trimester; Supervision of high risk pregnancy, antepartum; Group B streptococcal bacteriuria; Alpha thalassemia silent carrier; [redacted] weeks gestation of pregnancy; BMI 45.0-49.9, adult (HCC); and [redacted] weeks gestation of pregnancy on their problem list.  Patient reports  headache/migraine and pelvic pain with popping sound .  Contractions: Irritability. Vag. Bleeding: None.  Movement: Present. Denies leaking of fluid.   The following portions of the patient's history were reviewed and updated as appropriate: allergies, current medications, past family history, past medical history, past social history, past surgical history and problem list.   Objective:   Vitals:   02/27/21 1459  BP: 117/80  Pulse: 99  Weight: 286 lb (129.7 kg)    Fetal Status: Fetal Heart Rate (bpm): 145   Movement: Present     General:  Alert, oriented and cooperative. Patient is in no acute distress.  Skin: Skin is warm and dry. No rash noted.   Cardiovascular: Normal heart rate noted  Respiratory: Normal respiratory effort, no problems with respiration noted  Abdomen: Soft, gravid, appropriate for gestational age.  Pain/Pressure: Present     Pelvic: Cervical exam deferred        Extremities: Normal range of motion.  Edema: Trace  Mental Status: Normal mood and affect. Normal behavior. Normal judgment and thought content.   Assessment and Plan:   Pregnancy: G3P1011 at 358w0d. Supervision of high risk pregnancy, antepartum --Anticipatory guidance about next visits/weeks of pregnancy given.  --Next appt in 2 weeks  2. Chronic hypertension complicating or reason for care during pregnancy, second trimester --Pt with elevated BP at home today, per BabyRx, 140s/90s.  Pt with migraine headache for 1-2 days.   --BP grossly normal in office today --Continue BP at least 2-3 times per week.  If elevated again, consider bringing in cuff to check with office cuff. --PEC precautions reviewed --See mgmt of migraine below  - CBC - Comp Met (CMET) - Protein / creatinine ratio, urine  3. Gonorrhea affecting pregnancy, antepartum, second trimester --TOC neg 12/13   4. Trichomonal vaginitis during pregnancy, antepartum, second trimester --TOC negative 1/8  5. Group B streptococcal bacteriuria --Prophylaxis in labor  6. Bipolar 1 disorder (HCC) --Stable  7. [redacted] weeks gestation of pregnancy   8. Pain in symphysis pubis during pregnancy --Pain with walking, getting out of bed, popping sound of pelvis audible at times --Rest/ice/increase PO fluids/Tylenol/pregnancy support belt  - Ambulatory referral to Physical Therapy  9. Migraine without status migrainosus, not intractable, unspecified migraine type --headaches start in back of neck, then move to front. Today's h/a similar.   --BP at home today elevated so PEC labs drawn but grossly normal in office today. Pt CHTN without medications.    --Rx for Flexeril, and Rx for Fioricet per pt request since this helped in the past.  - Ambulatory referral to Neurology  10. Seizure disorder ([place]--[place] few [place] and is not taking medications.  Last seizure 3-4  months ago, during this pregnancy.   - Ambulatory referral to Neurology   Preterm labor symptoms and general obstetric precautions including but not limited to vaginal  bleeding, contractions, leaking of fluid and fetal movement were reviewed in detail with the patient. Please refer to After Visit Summary for other counseling recommendations.   No follow-ups on file.  Future Appointments  Date Time Provider [place]  03/09/2021  7:30 AM [facility] NURSE [facility]  03/09/2021  7:45 AM [facility] US4 [facility]  03/16/2021  8:15 AM [facility] NURSE [facility]  03/16/2021  8:30 AM [facility] US3 [facility]  03/23/2021  7:45 AM [facility] NURSE [facility]  03/23/2021  8:00 AM [facility] US1 [facility]    Fatima Blank, CNM

## 2021-02-27 NOTE — Progress Notes (Signed)
ROB, c/o pain 8/10 and popping in her pelvic which affects her sleep.

## 2021-02-27 NOTE — Telephone Encounter (Signed)
Phone call to patient to follow up on elevated BP reading reported by [employer] last night. BP was 130/98 and patient also reports having a headache and abdominal pain.   Patient states that she is unable to check BP at this time due to not being near her cuff. Patient states that she rechecked her BP again later and it was still high. Unable to recall numbers.   Also reports that she still has a headache which she has taken tylenol for with no relief.  Abdominal cramping has resolved at this time.   Patient is scheduled for a routine OB visit today at 2:30. Advised that we will follow up with her at that time.  Patient verbalized understanding.

## 2021-02-28 LAB — PROTEIN / CREATININE RATIO, URINE
Creatinine, Urine: 381.8 mg/dL
Protein, Ur: 79.1 mg/dL
Protein/Creat Ratio: 207 mg/g creat — ABNORMAL HIGH (ref 0–200)

## 2021-02-28 LAB — CBC
Hematocrit: 33.2 % — ABNORMAL LOW (ref 34.0–46.6)
Hemoglobin: 10.9 g/dL — ABNORMAL LOW (ref 11.1–15.9)
MCH: 24.7 pg — ABNORMAL LOW (ref 26.6–33.0)
MCHC: 32.8 g/dL (ref 31.5–35.7)
MCV: 75 fL — ABNORMAL LOW (ref 79–97)
Platelets: 342 10*3/uL (ref 150–450)
RBC: 4.41 x10E6/uL (ref 3.77–5.28)
RDW: 15.3 % (ref 11.7–15.4)
WBC: 8.8 10*3/uL (ref 3.4–10.8)

## 2021-02-28 LAB — COMPREHENSIVE METABOLIC PANEL
ALT: 7 IU/L (ref 0–32)
AST: 14 IU/L (ref 0–40)
Albumin/Globulin Ratio: 1.2 (ref 1.2–2.2)
Albumin: 3.6 g/dL — ABNORMAL LOW (ref 3.9–5.0)
Alkaline Phosphatase: 161 IU/L — ABNORMAL HIGH (ref 44–121)
BUN/Creatinine Ratio: 6 — ABNORMAL LOW (ref 9–23)
BUN: 3 mg/dL — ABNORMAL LOW (ref 6–20)
Bilirubin Total: <0.2 mg/dL (ref 0.0–1.2)
CO2: 20 mmol/L (ref 20–29)
Calcium: 9.5 mg/dL (ref 8.7–10.2)
Chloride: 104 mmol/L (ref 96–106)
Creatinine, Ser: 0.54 mg/dL — ABNORMAL LOW (ref 0.57–1.00)
Globulin, Total: 2.9 g/dL (ref 1.5–4.5)
Glucose: 68 mg/dL — ABNORMAL LOW (ref 70–99)
Potassium: 3.8 mmol/L (ref 3.5–5.2)
Sodium: 136 mmol/L (ref 134–144)
Total Protein: 6.5 g/dL (ref 6.0–8.5)
eGFR: 130 mL/min/{1.73_m2} (ref >59)

## 2021-03-01 ENCOUNTER — Other Ambulatory Visit: Payer: Self-pay

## 2021-03-01 ENCOUNTER — Ambulatory Visit: Payer: Medicaid Other | Attending: Advanced Practice Midwife

## 2021-03-01 ENCOUNTER — Ambulatory Visit: Payer: Medicaid Other

## 2021-03-01 DIAGNOSIS — Z3A31 31 weeks gestation of pregnancy: Secondary | ICD-10-CM | POA: Diagnosis not present

## 2021-03-01 DIAGNOSIS — M62838 Other muscle spasm: Secondary | ICD-10-CM

## 2021-03-01 DIAGNOSIS — O99891 Other specified diseases and conditions complicating pregnancy: Secondary | ICD-10-CM | POA: Insufficient documentation

## 2021-03-01 DIAGNOSIS — M6281 Muscle weakness (generalized): Secondary | ICD-10-CM

## 2021-03-01 DIAGNOSIS — M7918 Myalgia, other site: Secondary | ICD-10-CM | POA: Diagnosis not present

## 2021-03-01 DIAGNOSIS — R279 Unspecified lack of coordination: Secondary | ICD-10-CM

## 2021-03-01 NOTE — Therapy (Signed)
[facility] @ [facility] [employer], [place], [address] Phone: [phone]   Fax:  [phone]  Physical Therapy Evaluation  Patient Details  Name: Colleen Stone MRN: [id number] Date of Birth: [birth year] Referring Provider (PT): Elvera Maria, CNM   Encounter Date: 03/01/2021   PT End of Session - 03/01/21 1615     Visit Number 1    Authorization Type [employer]    PT Start Time [id number]    PT Stop Time [id number]    PT Time Calculation (min) 31 min    Activity Tolerance Patient tolerated treatment well    Behavior During Therapy [facility] for tasks assessed/performed             Past Medical History:  Diagnosis Date   Anxiety    Asthma    Chlamydia    Gonorrhea    Hypertension    takes labetalol   MRSA infection    8th grade   Other acne 06/30/2008   Overview:  Acne   PCOS (polycystic ovarian syndrome)    Seizures ([place])    Last seizure in 2017   Trichomonas infection     Past Surgical History:  Procedure Laterality Date   ADENOIDECTOMY     NASAL SEPTOPLASTY W/ TURBINOPLASTY     ROOT CANAL     TONSILLECTOMY     WISDOM TOOTH EXTRACTION      There were no vitals filed for this visit.    Subjective Assessment - 03/01/21 1548     Subjective Pt states that she started having uncomfortable popping in the front of the pelvis starting in December that also radiates into vaginal area, inner thighs, and low back. She is [redacted] weeks pregnant and due date is April 18th - induction to be scheduled. She reports intermittent pain throughout the day and extreme difficulty with sleeping; she does not use any pillows for support in bed at this time.    Pertinent History Chornic hypertension, history of seizures, G3P1011    Patient Stated Goals To decrease pain throughout the rest of pregnancy.    Currently in Pain? Yes    Pain Score 8     Pain Location Pelvis    Pain Orientation Right;Left    Pain Descriptors / Indicators  Radiating    Pain Type Chronic pain    Pain Onset More than a month ago    Pain Frequency Intermittent    Aggravating Factors  Walking, rolling in bed, putting on shoes, getting dressed, getting out of car, grocery shopping, lying on back    Pain Relieving Factors sitting in reclined position    Effect of Pain on Daily Activities Very limiting    Multiple Pain Sites No                [facility] PT Assessment - 03/01/21 0001       Assessment   Medical Diagnosis O99.891,M79.18 (ICD-10-CM) - Pain in symphysis pubis during pregnancy    Referring Provider (PT) Elvera Maria, CNM    Onset Date/Surgical Date 12/14/20    Next MD Visit two weeks    Prior Therapy no      Precautions   Precautions None      Restrictions   Weight Bearing Restrictions No      Balance Screen   Has the patient fallen in the past 6 months No    Has the patient had a decrease in activity level because of a fear of falling?  No    Is the patient reluctant to leave their home because of a fear of falling?  No      Home Ecologist residence    Living Arrangements Spouse/significant other      Prior Function   Level of Independence Independent      Cognition   Overall Cognitive Status Within Functional Limits for tasks assessed      Posture/Postural Control   Posture Comments Has to sit reclined with support from Bil UE in order to be comfortable      ROM / Strength   AROM / PROM / Strength AROM      AROM   Overall AROM Comments Lumbar: flexion reduced by 50% with perineal pain, extension reduced by 75% but pain free, Bil side bend WFL      Palpation   Palpation comment Tenderness to palption throughout lower abdomen and over pubic symphysis; notable tension in Bil adductors      Ambulation/Gait   Gait Comments Increased step width, decreased gait speed, decreased hip extension                        Objective measurements completed on  examination: See above findings.     Pelvic Floor Special Questions - 03/01/21 0001     Prior Pelvic/Prostate Exam Yes    Are you Pregnant or attempting pregnancy? Yes    Prior Pregnancies Yes    Number of Pregnancies 1    Number of C-Sections 0    Number of Vaginal Deliveries 1    Any difficulty with labor and deliveries No    Episiotomy Performed No   tear with stitches   Currently Sexually Active No    History of sexually transmitted disease Yes    Urinary Leakage No    Urinary urgency Yes    Urinary frequency every 30 minutes    Fecal incontinence No    Fluid intake yes    Caffeine beverages no    Falling out feeling (prolapse) Yes    Activities that cause feeling of prolapse on toilet and standing and walking              [facility] Adult PT Treatment/Exercise - 03/01/21 0001       Bed Mobility   Bed Mobility Rolling Right;Rolling Left;Right Sidelying to Sit;Left Sidelying to Sit   Extreme diffiuclty, breath holding, pelvic pain (especially at pubic symphysis); large improvements in pain with core activation training and making sure she is log-rolling and sitting up from side lying.     Self-Care   Self-Care Other Self-Care Comments   Pelvic stability belt recommendations with demonstration; how to incorporate core facilitation trianing into daily activities     Neuro Re-ed    Neuro Re-ed Details  Transversus abdominus training with multi-modal cues for pain management and pelvic stability                     PT Education - 03/01/21 1702     Education Details Pt education performed on pelvic anatomy/current instability due to pregnancy; we discussed benefit of pelvic support belt and demonstrated how to use; pt educatoin performed on core facilitation and how to incorporate into bed mobility and functional activities.    Person(s) Educated Patient    Methods Explanation;Demonstration;Tactile cues;Verbal cues;Handout    Comprehension Verbalized understanding               PT  Short Term Goals - 03/01/21 1709       PT SHORT TERM GOAL #1   Title Pt will be independent with HEP.    Time 4    Period Weeks    Status New    Target Date 03/29/21      PT SHORT TERM GOAL #2   Title Pt will be independent with appropriate body mechanics and pressure management in bed mobility/transfers in order to decrease pain with functional activities.    Time 4    Period Weeks    Status New    Target Date 03/29/21               PT Long Term Goals - 03/01/21 1711       PT LONG TERM GOAL #1   Title Pt will improve lumbar A/ROM by 25% in order to improve functional ability and help prepare for childbirth.    Time 8    Period Weeks    Status New    Target Date 04/26/21      PT LONG TERM GOAL #2   Title Pt will be able to tolerate deep squat position for >5 minutes in order to prepare for delivery in this position (patient preference).    Time 8    Period Weeks    Status New    Target Date 04/26/21      PT LONG TERM GOAL #3   Title Pt will be independent with advanced HEP.    Time 8    Period Weeks    Status New    Target Date 04/26/21      PT LONG TERM GOAL #4   Title Pt will report decrease in painful episodes not exceeding 4/10 pain to 1-2x/day leading up to delivery and independent with pain management techniques for these situations.    Time 8    Period Weeks    Status New    Target Date 04/26/21                    Plan - 03/01/21 1703     Clinical Impression Statement Pt is a 26 year old female [redacted] weeks pregnant with second child with chief complaint pelvic pain that is radiating into low back, inner thighs, and perineum for the last 2 months. Exam findings notable for decreased core activation, altered gait pattern and posture, tenderness throughout pelvic and hips, and decreased lumbar A/ROM. Signs and symptoms are most consistent with decreased pelvic stability and weakness in pregnancy. Initial treatment included  pt education on pelvic anatomy/current instability due to pregnancy; we discussed benefit of pelvic support belt and demonstrated how to use; pt educatoin performed on core facilitation and how to incorporate into bed mobility and functional activities. She will benefit from skilled PT intervention in order to decrease pain, help prepare patient for childbirth, and improve her pain management strategies.    Personal Factors and Comorbidities Comorbidity 1;Comorbidity 2    Comorbidities Chornic hypertension, history of seizures, G3P1011    Examination-Activity Limitations Bed Mobility;Bend;Caring for Others;Dressing;Lift;Locomotion Level;Sleep;Stairs;Stand;Transfers    Examination-Participation Restrictions Community Activity;Driving;Interpersonal Relationship    Stability/Clinical Decision Making Stable/Uncomplicated    Clinical Decision Making Low    Rehab Potential Fair    PT Frequency 2x / week    PT Duration 8 weeks    PT Treatment/Interventions ADLs/Self Care Home Management;Biofeedback;Cryotherapy;Electrical Stimulation;Moist Heat;Therapeutic activities;Therapeutic exercise;Neuromuscular re-education;Manual techniques;Patient/family education;Scar mobilization;Passive range of motion;Dry needling;Spinal Manipulations    PT Next Visit Plan Plan  to progress core facilitation training with functional activities; begin gentle pelvic stability exercises/isometrics; mobility activities to decrease guarding.    PT Home Exercise Plan NA    Consulted and Agree with Plan of Care Patient             Patient will benefit from skilled therapeutic intervention in order to improve the following deficits and impairments:  Abnormal gait, Decreased coordination, Decreased range of motion, Difficulty walking, Increased fascial restricitons, Impaired tone, Decreased endurance, Increased muscle spasms, Pain, Decreased activity tolerance, Hypermobility, Decreased scar mobility, Hypomobility, Impaired  flexibility, Improper body mechanics, Decreased mobility, Decreased strength, Postural dysfunction  Visit Diagnosis: Other muscle spasm  Pain in symphysis pubis during pregnancy  Unspecified lack of coordination  Muscle weakness (generalized)     Problem List Patient Active Problem List   Diagnosis Date Noted   [redacted] weeks gestation of pregnancy 01/23/2021   [redacted] weeks gestation of pregnancy 12/26/2020   BMI 45.0-49.9, adult ([place]) 12/26/2020   Group B streptococcal bacteriuria 11/28/2020   Alpha thalassemia silent carrier 11/28/2020   Supervision of high risk pregnancy, antepartum 10/05/2020   Gonorrhea affecting pregnancy in first trimester 09/13/2020   Hypertension    History of seizure disorder 06/15/2018   Chronic hypertension complicating or reason for care during pregnancy, second trimester 05/07/2018   Bipolar 1 disorder ([place]) 01/13/2018   Family history of schizophrenia 01/13/2018   Smoker 01/13/2018   Atypical squamous cells of undetermined significance (ASCUS) on Papanicolaou smear of cervix 01/26/2013   Family history of breast cancer 08/31/2012   Heather Roberts, PT, DPT02/16/235:20 PM   [facility] @ [place], [place], [address] Phone: [phone]   Fax:  [phone]  Name: Raelynne Fadel MRN: [id number] Date of Birth: April 06, 1995

## 2021-03-02 ENCOUNTER — Encounter: Payer: Self-pay | Admitting: Neurology

## 2021-03-02 ENCOUNTER — Ambulatory Visit: Payer: Self-pay | Admitting: Neurology

## 2021-03-08 ENCOUNTER — Ambulatory Visit: Payer: Medicaid Other

## 2021-03-09 ENCOUNTER — Other Ambulatory Visit: Payer: Self-pay | Admitting: *Deleted

## 2021-03-09 ENCOUNTER — Other Ambulatory Visit: Payer: Self-pay

## 2021-03-09 ENCOUNTER — Ambulatory Visit: Payer: Medicaid Other | Admitting: *Deleted

## 2021-03-09 ENCOUNTER — Ambulatory Visit: Payer: Medicaid Other | Attending: Obstetrics

## 2021-03-09 VITALS — BP 112/68 | HR 119

## 2021-03-09 DIAGNOSIS — O99353 Diseases of the nervous system complicating pregnancy, third trimester: Secondary | ICD-10-CM | POA: Diagnosis not present

## 2021-03-09 DIAGNOSIS — Z6841 Body Mass Index (BMI) 40.0 and over, adult: Secondary | ICD-10-CM | POA: Insufficient documentation

## 2021-03-09 DIAGNOSIS — G40909 Epilepsy, unspecified, not intractable, without status epilepticus: Secondary | ICD-10-CM

## 2021-03-09 DIAGNOSIS — O10013 Pre-existing essential hypertension complicating pregnancy, third trimester: Secondary | ICD-10-CM

## 2021-03-09 DIAGNOSIS — O10913 Unspecified pre-existing hypertension complicating pregnancy, third trimester: Secondary | ICD-10-CM | POA: Insufficient documentation

## 2021-03-09 DIAGNOSIS — O283 Abnormal ultrasonic finding on antenatal screening of mother: Secondary | ICD-10-CM | POA: Insufficient documentation

## 2021-03-09 DIAGNOSIS — Z3A32 32 weeks gestation of pregnancy: Secondary | ICD-10-CM

## 2021-03-09 DIAGNOSIS — O0990 Supervision of high risk pregnancy, unspecified, unspecified trimester: Secondary | ICD-10-CM

## 2021-03-09 DIAGNOSIS — R638 Other symptoms and signs concerning food and fluid intake: Secondary | ICD-10-CM

## 2021-03-15 ENCOUNTER — Ambulatory Visit: Payer: Medicaid Other | Attending: Advanced Practice Midwife

## 2021-03-15 ENCOUNTER — Other Ambulatory Visit: Payer: Self-pay

## 2021-03-15 DIAGNOSIS — R279 Unspecified lack of coordination: Secondary | ICD-10-CM | POA: Insufficient documentation

## 2021-03-15 DIAGNOSIS — M7918 Myalgia, other site: Secondary | ICD-10-CM | POA: Diagnosis present

## 2021-03-15 DIAGNOSIS — M6281 Muscle weakness (generalized): Secondary | ICD-10-CM | POA: Diagnosis present

## 2021-03-15 DIAGNOSIS — O99891 Other specified diseases and conditions complicating pregnancy: Secondary | ICD-10-CM | POA: Insufficient documentation

## 2021-03-15 DIAGNOSIS — M62838 Other muscle spasm: Secondary | ICD-10-CM | POA: Insufficient documentation

## 2021-03-15 NOTE — Patient Instructions (Signed)
Access Code: [email or web address] ?URL: [email or web address] ?Date: 03/15/2021 ?Prepared by: Julio Alm ? ?Exercises ?Seated Hip Adduction Squeeze with Ball - 1 x daily - 7 x weekly - 2 sets - 10 reps - 10 hold ?Seated Hip Abduction with Resistance - 1 x daily - 7 x weekly - 2 sets - 10 reps - 10 hold ?Seated March - 1 x daily - 7 x weekly - 3 sets - 10 reps ?Seated Heel Raise - 1 x daily - 7 x weekly - 3 sets - 10 reps ? ?

## 2021-03-15 NOTE — Therapy (Addendum)
[facility] @ [facility] [address] [place], [place], [address] Phone: [phone]   Fax:  [phone]  Physical Therapy Treatment  Patient Details  Name: Colleen Stone MRN: [id number] Date of Birth: [birth year] Referring Provider (PT): Hurshel Party, CNM   Encounter Date: 03/15/2021   PT End of Session - 03/15/21 1022     Visit Number 2    Authorization Type [employer]    PT Start Time 1021    PT Stop Time 1059    PT Time Calculation (min) 38 min    Activity Tolerance Patient tolerated treatment well    Behavior During Therapy [facility] for tasks assessed/performed             Past Medical History:  Diagnosis Date   Anxiety    Asthma    Chlamydia    Gonorrhea    Hypertension    takes labetalol   MRSA infection    8th grade   Other acne 06/30/2008   Overview:  Acne   PCOS (polycystic ovarian syndrome)    Seizures ([facility])    Last seizure in 2017   Trichomonas infection     Past Surgical History:  Procedure Laterality Date   ADENOIDECTOMY     NASAL SEPTOPLASTY W/ TURBINOPLASTY     ROOT CANAL     TONSILLECTOMY     WISDOM TOOTH EXTRACTION      There were no vitals filed for this visit.   Subjective Assessment - 03/15/21 1023     Subjective Pt states that she has gotten pillows to help support her while sleeping and she feels like she is sleeping much better. She has also purchased belt and she typically wears it throughout the day all day. She is not currently wearing because she is not having any pain. She feels like she can actually walk.    Patient Stated Goals To decrease pain throughout the rest of pregnancy.    Currently in Pain? No/denies    Multiple Pain Sites No                               [facility] Adult PT Treatment/Exercise - 03/15/21 0001       Neuro Re-ed    Neuro Re-ed Details  Transversus abdominus training with multi-modal cues for pain management and pelvic stability       Exercises   Exercises Lumbar      Lumbar Exercises: Stretches   Active Hamstring Stretch Right;Left;1 rep;60 seconds    Piriformis Stretch Right;Left;1 rep;60 seconds      Lumbar Exercises: Seated   Other Seated Lumbar Exercises Swiss ball activities including circles, lateral glides, pelvic tilts, bent knee fall in, marching, heel raises - all activities 15 min    Other Seated Lumbar Exercises Hip adduction/abduction isometrics 10 x 10 sec each                     PT Education - 03/15/21 1050     Education Details Pt education performed on new exercise progressions and benefit of compression stockings/ciruclation exercises to help reduce swelling.    Person(s) Educated Patient    Methods Explanation;Demonstration;Tactile cues;Verbal cues;Handout    Comprehension Verbalized understanding              PT Short Term Goals - 03/01/21 1709       PT SHORT TERM GOAL #1   Title Pt will  be independent with HEP.    Time 4    Period Weeks    Status New    Target Date 03/29/21      PT SHORT TERM GOAL #2   Title Pt will be independent with appropriate body mechanics and pressure management in bed mobility/transfers in order to decrease pain with functional activities.    Time 4    Period Weeks    Status New    Target Date 03/29/21               PT Long Term Goals - 03/01/21 1711       PT LONG TERM GOAL #1   Title Pt will improve lumbar A/ROM by 25% in order to improve functional ability and help prepare for childbirth.    Time 8    Period Weeks    Status New    Target Date 04/26/21      PT LONG TERM GOAL #2   Title Pt will be able to tolerate deep squat position for >5 minutes in order to prepare for delivery in this position (patient preference).    Time 8    Period Weeks    Status New    Target Date 04/26/21      PT LONG TERM GOAL #3   Title Pt will be independent with advanced HEP.    Time 8    Period Weeks    Status New    Target Date  04/26/21      PT LONG TERM GOAL #4   Title Pt will report decrease in painful episodes not exceeding 4/10 pain to 1-2x/day leading up to delivery and independent with pain management techniques for these situations.    Time 8    Period Weeks    Status New    Target Date 04/26/21                   Plan - 03/15/21 1044     Clinical Impression Statement Pt has made significnat progress with pain reduction. She did very well with all swiss ball exercises today to challenge moiblity and pelvic stability. HEP updated with good circulation and stability exercises. We discussed benefit of compression stockings to help with swelling. She will continue to benefit from skilled PT intervention in order to decrease pain, teach pain management techniques, and prepare for healthy delivery.    PT Treatment/Interventions ADLs/Self Care Home Management;Biofeedback;Cryotherapy;Electrical Stimulation;Moist Heat;Therapeutic activities;Therapeutic exercise;Neuromuscular re-education;Manual techniques;Patient/family education;Scar mobilization;Passive range of motion;Dry needling;Spinal Manipulations    PT Next Visit Plan Progress pelvic stability exercises; pain management techniques as needed.    PT Home Exercise Plan [email or web address]    Consulted and Agree with Plan of Care Patient             Patient will benefit from skilled therapeutic intervention in order to improve the following deficits and impairments:  Abnormal gait, Decreased coordination, Decreased range of motion, Difficulty walking, Increased fascial restricitons, Impaired tone, Decreased endurance, Increased muscle spasms, Pain, Decreased activity tolerance, Hypermobility, Decreased scar mobility, Hypomobility, Impaired flexibility, Improper body mechanics, Decreased mobility, Decreased strength, Postural dysfunction  Visit Diagnosis: Pain in symphysis pubis during pregnancy  Other muscle spasm  Unspecified lack of coordination  Muscle  weakness (generalized)     Problem List Patient Active Problem List   Diagnosis Date Noted   [redacted] weeks gestation of pregnancy 01/23/2021   [redacted] weeks gestation of pregnancy 12/26/2020   BMI 45.0-49.9, adult ([facility]) 12/26/2020   Group B streptococcal  bacteriuria 11/28/2020   Alpha thalassemia silent carrier 11/28/2020   Supervision of high risk pregnancy, antepartum 10/05/2020   Gonorrhea affecting pregnancy in first trimester 09/13/2020   Hypertension    History of seizure disorder 06/15/2018   Chronic hypertension complicating or reason for care during pregnancy, second trimester 05/07/2018   Bipolar 1 disorder ([facility]) 01/13/2018   Family history of schizophrenia 01/13/2018   Smoker 01/13/2018   Atypical squamous cells of undetermined significance (ASCUS) on Papanicolaou smear of cervix 01/26/2013   Family history of breast cancer 08/31/2012    Julio Alm, PT, DPT03/02/2309:00 AM   [facility] @ [facility] [address] [place], [place], [address] Phone: [phone]   Fax:  [phone]  Name: Colleen Stone MRN: [id number] Date of Birth: 01/23/1995  PHYSICAL THERAPY DISCHARGE SUMMARY  Visits from Start of Care: 2  Current functional level related to goals / functional outcomes: Not met   Remaining deficits: See above   Education / Equipment: HEP   Patient agrees to discharge. Patient goals were not met. Patient is being discharged due to not returning since the last visit.  Julio Alm, PT, DPT03/16/231:04 PM

## 2021-03-16 ENCOUNTER — Ambulatory Visit: Payer: Medicaid Other | Attending: Obstetrics

## 2021-03-16 ENCOUNTER — Ambulatory Visit: Payer: Medicaid Other

## 2021-03-19 ENCOUNTER — Other Ambulatory Visit: Payer: Self-pay

## 2021-03-19 ENCOUNTER — Ambulatory Visit (INDEPENDENT_AMBULATORY_CARE_PROVIDER_SITE_OTHER): Payer: Medicaid Other | Admitting: Advanced Practice Midwife

## 2021-03-19 VITALS — BP 124/89 | HR 99 | Wt 288.0 lb

## 2021-03-19 DIAGNOSIS — O219 Vomiting of pregnancy, unspecified: Secondary | ICD-10-CM

## 2021-03-19 DIAGNOSIS — O10912 Unspecified pre-existing hypertension complicating pregnancy, second trimester: Secondary | ICD-10-CM

## 2021-03-19 DIAGNOSIS — R8271 Bacteriuria: Secondary | ICD-10-CM

## 2021-03-19 DIAGNOSIS — A5901 Trichomonal vulvovaginitis: Secondary | ICD-10-CM

## 2021-03-19 DIAGNOSIS — O98212 Gonorrhea complicating pregnancy, second trimester: Secondary | ICD-10-CM

## 2021-03-19 DIAGNOSIS — O23592 Infection of other part of genital tract in pregnancy, second trimester: Secondary | ICD-10-CM

## 2021-03-19 DIAGNOSIS — Z3A33 33 weeks gestation of pregnancy: Secondary | ICD-10-CM

## 2021-03-19 DIAGNOSIS — O0990 Supervision of high risk pregnancy, unspecified, unspecified trimester: Secondary | ICD-10-CM

## 2021-03-19 NOTE — Progress Notes (Signed)
? ?PRENATAL VISIT NOTE ? ?Subjective:  ?Colleen Stone is a 26 y.o. G3P1011 at [redacted]w[redacted]d being seen today for ongoing prenatal care.  She is currently monitored for the following issues for this high-risk pregnancy and has Bipolar 1 disorder ([place]); Family history of breast cancer; Family history of schizophrenia; Smoker; Chronic hypertension complicating or reason for care during pregnancy, second trimester; History of seizure disorder; Hypertension; Atypical squamous cells of undetermined significance (ASCUS) on Papanicolaou smear of cervix; Gonorrhea affecting pregnancy in first trimester; Supervision of high risk pregnancy, antepartum; Group B streptococcal bacteriuria; Alpha thalassemia silent carrier; [redacted] weeks gestation of pregnancy; BMI 45.0-49.9, adult ([place]); and [redacted] weeks gestation of pregnancy on their problem list. ? ?Patient reports  pelvic pain improved with exercises/PT, cramping at night only, nausea/indigestion/vomiting .  Contractions: Irritability. Vag. Bleeding: None.  Movement: Present. Denies leaking of fluid.  ? ?The following portions of the patient's history were reviewed and updated as appropriate: allergies, current medications, past family history, past medical history, past social history, past surgical history and problem list.  ? ?Objective:  ? ?Vitals:  ? 03/19/21 1603  ?BP: 124/89  ?Pulse: 99  ?Weight: 288 lb (130.6 kg)  ? ? ?Fetal Status: Fetal Heart Rate (bpm): 155 Fundal Height: 36 cm Movement: Present    ? ?General:  Alert, oriented and cooperative. Patient is in no acute distress.  ?Skin: Skin is warm and dry. No rash noted.   ?Cardiovascular: Normal heart rate noted  ?Respiratory: Normal respiratory effort, no problems with respiration noted  ?Abdomen: Soft, gravid, appropriate for gestational age.  Pain/Pressure: Present     ?Pelvic: Cervical exam deferred        ?Extremities: Normal range of motion.  Edema: Trace  ?Mental Status: Normal mood and affect. Normal behavior. Normal  judgment and thought content.  ? ?Assessment and Plan:  ?Pregnancy: G3P1011 at [redacted]w[redacted]d ?1. Supervision of high risk pregnancy, antepartum ?--Anticipatory guidance about next visits/weeks of pregnancy given.  ?--Size greater than dates, growth [place] scheduled due to CHTN, BMI > 40 ?--Next appt in 2 weeks ? ?2. Chronic hypertension complicating or reason for care during pregnancy, second trimester ?--Normotensive today, some severe ranges at home a few weeks ago, occasional headaches, none today.  ?--IOL at 38-39 weeks, given recent episode of severe ranges at home, and pt report of severe BPs at term with previous pregnancy, 38 week IOL is reasonable. ?--Orders placed for IOL today ? ?3. Gonorrhea affecting pregnancy, antepartum, second trimester ?--neg TOC ? ?4. Trichomonal vaginitis during pregnancy, antepartum, second trimester ? ? ?5. Group B streptococcal bacteriuria ?--Prophylaxis in labor ? ?6. [redacted] weeks gestation of pregnancy ? ?7. Nausea and vomiting during pregnancy ?--Pt taking Phenergan and Zofran, reports she feels like slow digestion causing her to vomit undigested food.   ?--Try Reglan TID PRN, eat smaller more frequent meals ? ?  ?Preterm labor symptoms and general obstetric precautions including but not limited to vaginal bleeding, contractions, leaking of fluid and fetal movement were reviewed in detail with the patient. ?Please refer to After Visit Summary for other counseling recommendations.  ? ?Return in about 2 weeks (around 04/02/2021). ? ?Future Appointments  ?Date Time Provider [place]  ?03/22/2021 10:15 AM Janne Napoleon, [place], PT [facility] None  ?03/23/2021  7:45 AM [facility] NURSE [facility]  ?03/23/2021  8:00 AM [facility] US1 [facility]  ?03/29/2021 12:30 PM Janne Napoleon, [place], PT [facility] None  ?03/30/2021  7:15 AM [facility] NURSE [facility]  ?03/30/2021  7:30 AM [facility] US2  [facility] [place]  ?04/02/2021  4:10 PM Constant, Vickii Chafe, MD [facility] None  ?04/03/2021 11:45 AM Janne Napoleon, [place], PT [facility]  None  ?04/05/2021 12:30 PM Jule Economy, PT [facility] None  ?04/06/2021  7:30 AM [facility] NURSE [facility]  ?04/06/2021  7:45 AM [facility] US5 [facility]  ?04/10/2021  2:00 PM Jule Economy, PT [facility] None  ?04/12/2021 12:30 PM Janne Napoleon, [place], PT [facility] None  ?04/13/2021  7:30 AM [facility] NURSE [facility]  ?04/13/2021  7:45 AM [facility] US4 [facility]  ?04/17/2021  2:45 PM Janne Napoleon, [place], PT [facility] None  ?04/19/2021  2:00 PM Jule Economy, PT [facility] None  ?04/23/2021  3:30 PM Jule Economy, PT [facility] None  ? ? ?Fatima Blank, CNM  ?

## 2021-03-19 NOTE — Progress Notes (Signed)
Patient presents for ROB. Has no complaints today.  ?

## 2021-03-22 ENCOUNTER — Telehealth: Payer: Self-pay

## 2021-03-22 ENCOUNTER — Ambulatory Visit: Payer: Medicaid Other

## 2021-03-22 NOTE — Telephone Encounter (Signed)
Called and LM for patient about 10:15 appointment no-show on 03/22/2021. No-show policy reviewed.  ?

## 2021-03-23 ENCOUNTER — Ambulatory Visit: Payer: Medicaid Other | Attending: Obstetrics

## 2021-03-23 ENCOUNTER — Ambulatory Visit: Payer: Medicaid Other | Admitting: *Deleted

## 2021-03-23 ENCOUNTER — Other Ambulatory Visit: Payer: Self-pay

## 2021-03-23 VITALS — BP 114/74 | HR 126

## 2021-03-23 DIAGNOSIS — O358XX0 Maternal care for other (suspected) fetal abnormality and damage, not applicable or unspecified: Secondary | ICD-10-CM | POA: Diagnosis not present

## 2021-03-23 DIAGNOSIS — Z3A34 34 weeks gestation of pregnancy: Secondary | ICD-10-CM

## 2021-03-23 DIAGNOSIS — G40909 Epilepsy, unspecified, not intractable, without status epilepticus: Secondary | ICD-10-CM | POA: Insufficient documentation

## 2021-03-23 DIAGNOSIS — O99213 Obesity complicating pregnancy, third trimester: Secondary | ICD-10-CM | POA: Diagnosis not present

## 2021-03-23 DIAGNOSIS — O113 Pre-existing hypertension with pre-eclampsia, third trimester: Secondary | ICD-10-CM

## 2021-03-23 DIAGNOSIS — O283 Abnormal ultrasonic finding on antenatal screening of mother: Secondary | ICD-10-CM | POA: Insufficient documentation

## 2021-03-23 DIAGNOSIS — O99353 Diseases of the nervous system complicating pregnancy, third trimester: Secondary | ICD-10-CM | POA: Diagnosis not present

## 2021-03-23 DIAGNOSIS — O0990 Supervision of high risk pregnancy, unspecified, unspecified trimester: Secondary | ICD-10-CM

## 2021-03-23 DIAGNOSIS — Z6841 Body Mass Index (BMI) 40.0 and over, adult: Secondary | ICD-10-CM

## 2021-03-23 DIAGNOSIS — O10913 Unspecified pre-existing hypertension complicating pregnancy, third trimester: Secondary | ICD-10-CM | POA: Insufficient documentation

## 2021-03-29 ENCOUNTER — Telehealth: Payer: Self-pay

## 2021-03-29 NOTE — Telephone Encounter (Unsigned)
Called patient about second no-show today. Unable to leave message due to full mailbox. She was sent MyChart message saying she will be discharged due to poor attendance. ?

## 2021-03-30 ENCOUNTER — Ambulatory Visit: Payer: Medicaid Other | Attending: Obstetrics

## 2021-03-30 ENCOUNTER — Other Ambulatory Visit: Payer: Self-pay

## 2021-03-30 ENCOUNTER — Ambulatory Visit: Payer: Medicaid Other | Admitting: *Deleted

## 2021-03-30 VITALS — BP 127/91 | HR 118

## 2021-03-30 DIAGNOSIS — O0990 Supervision of high risk pregnancy, unspecified, unspecified trimester: Secondary | ICD-10-CM | POA: Insufficient documentation

## 2021-03-30 DIAGNOSIS — Z3A35 35 weeks gestation of pregnancy: Secondary | ICD-10-CM

## 2021-03-30 DIAGNOSIS — R638 Other symptoms and signs concerning food and fluid intake: Secondary | ICD-10-CM | POA: Insufficient documentation

## 2021-03-30 DIAGNOSIS — G40909 Epilepsy, unspecified, not intractable, without status epilepticus: Secondary | ICD-10-CM | POA: Insufficient documentation

## 2021-03-30 DIAGNOSIS — O113 Pre-existing hypertension with pre-eclampsia, third trimester: Secondary | ICD-10-CM

## 2021-03-30 DIAGNOSIS — O10913 Unspecified pre-existing hypertension complicating pregnancy, third trimester: Secondary | ICD-10-CM | POA: Insufficient documentation

## 2021-03-30 DIAGNOSIS — O99213 Obesity complicating pregnancy, third trimester: Secondary | ICD-10-CM

## 2021-03-30 DIAGNOSIS — O99353 Diseases of the nervous system complicating pregnancy, third trimester: Secondary | ICD-10-CM

## 2021-03-30 DIAGNOSIS — O358XX0 Maternal care for other (suspected) fetal abnormality and damage, not applicable or unspecified: Secondary | ICD-10-CM

## 2021-04-02 ENCOUNTER — Encounter: Payer: Self-pay | Admitting: Obstetrics and Gynecology

## 2021-04-02 ENCOUNTER — Telehealth (INDEPENDENT_AMBULATORY_CARE_PROVIDER_SITE_OTHER): Payer: Medicaid Other | Admitting: Obstetrics and Gynecology

## 2021-04-02 VITALS — BP 123/89 | HR 104

## 2021-04-02 DIAGNOSIS — O10912 Unspecified pre-existing hypertension complicating pregnancy, second trimester: Secondary | ICD-10-CM

## 2021-04-02 DIAGNOSIS — O99820 Streptococcus B carrier state complicating pregnancy: Secondary | ICD-10-CM

## 2021-04-02 DIAGNOSIS — O10913 Unspecified pre-existing hypertension complicating pregnancy, third trimester: Secondary | ICD-10-CM

## 2021-04-02 DIAGNOSIS — Z6841 Body Mass Index (BMI) 40.0 and over, adult: Secondary | ICD-10-CM

## 2021-04-02 DIAGNOSIS — O0993 Supervision of high risk pregnancy, unspecified, third trimester: Secondary | ICD-10-CM

## 2021-04-02 DIAGNOSIS — R8271 Bacteriuria: Secondary | ICD-10-CM

## 2021-04-02 DIAGNOSIS — O0990 Supervision of high risk pregnancy, unspecified, unspecified trimester: Secondary | ICD-10-CM

## 2021-04-02 DIAGNOSIS — Z3A35 35 weeks gestation of pregnancy: Secondary | ICD-10-CM

## 2021-04-02 MED ORDER — LABETALOL HCL 200 MG PO TABS: 200.0000 mg | ORAL_TABLET | Freq: Two times a day (BID) | ORAL | 3 refills | Status: DC

## 2021-04-02 NOTE — Progress Notes (Signed)
OBSTETRICS PRENATAL VIRTUAL VISIT ENCOUNTER NOTE  Provider location: [facility]   Patient location: Home  I connected with Colleen Stone on 04/02/21 at  2:30 PM EDT by MyChart Video Encounter and verified that I am speaking with the correct person using two identifiers. I discussed the limitations, risks, security and privacy concerns of performing an evaluation and management service virtually and the availability of in person appointments. I also discussed with the patient that there may be a patient responsible charge related to this service. The patient expressed understanding and agreed to proceed. Subjective:  Colleen Stone is a 26 y.o. G3P1011 at [redacted]w[redacted]d being seen today for ongoing prenatal care.  She is currently monitored for the following issues for this high-risk pregnancy and has Bipolar 1 disorder (HCC); Family history of breast cancer; Family history of schizophrenia; Smoker; Chronic hypertension complicating or reason for care during pregnancy, second trimester; History of seizure disorder; Hypertension; Atypical squamous cells of undetermined significance (ASCUS) on Papanicolaou smear of cervix; Gonorrhea affecting pregnancy in first trimester; Supervision of high risk pregnancy, antepartum; Group B streptococcal bacteriuria; Alpha thalassemia silent carrier; and BMI 45.0-49.9, adult (HCC) on their problem list.  Patient reports no complaints.  Contractions: Irregular. Vag. Bleeding: Bloody Show.  Movement: Present. Denies any leaking of fluid.   The following portions of the patient's history were reviewed and updated as appropriate: allergies, current medications, past family history, past medical history, past social history, past surgical history and problem list.   Objective:  There were no vitals filed for this visit.  Fetal Status:     Movement: Present     General:  Alert, oriented and cooperative. Patient is in no acute distress.   Respiratory: Normal respiratory effort, no problems with respiration noted  Mental Status: Normal mood and affect. Normal behavior. Normal judgment and thought content.  Rest of physical exam deferred due to type of encounter  Imaging: [place] MFM FETAL BPP WO NON STRESS  Result Date: 03/30/2021 ----------------------------------------------------------------------  OBSTETRICS REPORT                       (Signed Final 03/30/2021 08:25 am) ---------------------------------------------------------------------- Patient Info  ID #:       [id number]                          D.O.B.:  04-Sep-1995 (26 yrs)  Name:       Colleen Stone               Visit Date: 03/30/2021 08:04 am ---------------------------------------------------------------------- Performed By  Attending:        Ma Rings MD         Secondary Phy.:   Colleen A LEFTWICH-                                                             KIRBY CNM  Performed By:     Colleen Stone         Address:          [address]                    [address]                                                             [place]  Referred By:      Colleen Stone              [facility]                    Colleen Stone                                   [facility] ---------------------------------------------------------------------- Orders  #  Description                           Code        Ordered By  1  [place] MFM FETAL BPP WO NON               76819.01    Colleen Stone     STRESS ----------------------------------------------------------------------  #  Order #                     Accession #                Episode #  1  [id number]                   [id number]                 [id number] ----------------------------------------------------------------------  Indications  Obesity complicating pregnancy, third          O99.213  trimester  Pre-existing hypertension with preeclampsia,   O11.3  third trimester  [redacted] weeks gestation of pregnancy                Z3A.35  Echogenic intracardiac focus of the heart      O35.8XX0  (EIF)  Seizure disorder                               O99.350 G40.909  Genetic carrier (alpha thal silent carrier)    Z14.8  LR NIPS ---------------------------------------------------------------------- Fetal Evaluation  Num Of Fetuses:         1  Fetal Heart Rate(bpm):  140  Cardiac Activity:       Observed  Presentation:           Cephalic  Placenta:               Posterior  P. Cord Insertion:      Previously Visualized  Amniotic Fluid  AFI FV:      Within normal limits  AFI Sum(cm)     %Tile       Largest Pocket(cm)  9.2             15          3.6  RUQ(cm)       RLQ(cm)       LUQ(cm)        LLQ(cm)  2             0             3.6            3.6 ---------------------------------------------------------------------- Biophysical Evaluation  Amniotic F.V:   Within normal limits       F. Tone:        Observed  F. Movement:    Observed                   Score:          8/8  F. Breathing:   Observed ---------------------------------------------------------------------- OB History  Gravidity:    3         Term:   1 ---------------------------------------------------------------------- Gestational Age  LMP:           35w 3d        Date:  07/25/20                 EDD:   05/01/21  Best:          Colleen Stone 3d     Det. By:  LMP  (07/25/20)          EDD:   05/01/21 ---------------------------------------------------------------------- Anatomy  Cranium:               Previously seen        LVOT:                   Previously seen  Cavum:                 Previously seen        Aortic Arch:            Previously seen  Ventricles:            Previously seen        Ductal Arch:            Previously seen  Choroid Plexus:        Previously seen        Diaphragm:               Appears normal  Cerebellum:            Previously seen        Stomach:                Appears normal, left                                                                        sided  Posterior Fossa:  Previously seen        Abdomen:                Previously seen  Nuchal Fold:           Previously seen        Abdominal Wall:         Previously seen  Face:                  Orbits and profile     Cord Vessels:           Previously seen                         previously seen  Lips:                  Previously seen        Kidneys:                Appear normal  Palate:                Not well visualized    Bladder:                Appears normal  Thoracic:              Previously seen        Spine:                  Previously seen  Heart:                 Previously seen, EIF   Upper Extremities:      Previously seen  RVOT:                  Previously seen        Lower Extremities:      Previously seen  Other:  Female gender previously seen.Nasal bone, lenses, 3VV previously          visualized. Technically difficult due to maternal habitus and fetal          position. Hands and Heels previously visualized. ---------------------------------------------------------------------- Cervix Uterus Adnexa  Cervix  Not visualized (advanced GA >24wks)  Uterus  No abnormality visualized.  Right Ovary  Not visualized.  Left Ovary  Not visualized. ---------------------------------------------------------------------- Comments  This patient was seen for a biophysical profile due to chronic  hypertension treated with labetalol and obesity with a BMI of  49.  She denies any problems since her last exam.  A biophysical profile performed today was 8 out of 8.  There was normal amniotic fluid noted.  She will return in 1 week for another BPP.  She already has an induction of labor scheduled on April 17, 2021. ----------------------------------------------------------------------                   Colleen Rings, MD Electronically Signed  Final Report   03/30/2021 08:25 am ----------------------------------------------------------------------  [place] MFM FETAL BPP WO NON STRESS  Result Date: 03/23/2021 ----------------------------------------------------------------------  OBSTETRICS REPORT                       (Signed Final 03/23/2021 09:02 am) ---------------------------------------------------------------------- Patient Info  ID #:       [id number]                          D.O.B.:  06/01/1995 (26 yrs)  Name:  [facility]               Visit Date: 03/23/2021 08:10 am ---------------------------------------------------------------------- Performed By  Attending:        Ma Rings MD         Secondary Phy.:   Colleen A LEFTWICH-                                                             KIRBY CNM  Performed By:     Sandi Mealy        Address:          [address]                    [address]                                                             [address]                                                             [place]  Referred By:      Colleen Stone              [facility]                    Colleen Stone                                   [facility] ---------------------------------------------------------------------- Orders  #  Description                           Code        Ordered By  1  [place] MFM FETAL BPP WO NON               40981.19    Colleen Stone     STRESS  2  [place] MFM OB FOLLOW UP  56213.08    Rosana Hoes ----------------------------------------------------------------------  #  Order #                     Accession #                Episode #  1  [id number]                   [id number]                 [id number]  2  [id number]                   [id number]                 [id number] ----------------------------------------------------------------------  Indications  Obesity complicating pregnancy, third          O99.213  trimester  Pre-existing hypertension with preeclampsia,   O11.3  third trimester  Echogenic intracardiac focus of the heart      O35.8XX0  (EIF)  Seizure disorder                               O99.350 G40.909  Genetic carrier (alpha thal silent carrier)    Z14.8  [redacted] weeks gestation of pregnancy                Z3A.34  Encounter for other antenatal screening        Z36.2  follow-up  LR NIPS ---------------------------------------------------------------------- Fetal Evaluation  Num Of Fetuses:         1  Fetal Heart Rate(bpm):  155  Cardiac Activity:       Observed  Presentation:           Cephalic  Placenta:               Posterior  P. Cord Insertion:      Previously Visualized  Amniotic Fluid  AFI FV:      Within normal limits  AFI Sum(cm)     %Tile       Largest Pocket(cm)  17.59           65          6.56  RUQ(cm)       RLQ(cm)       LUQ(cm)        LLQ(cm)  6.56          3.69          3.33           4.01 ---------------------------------------------------------------------- Biophysical Evaluation  Amniotic F.V:   Within normal limits       F. Tone:        Observed  F. Movement:    Observed                   Score:          8/8  F. Breathing:   Observed ---------------------------------------------------------------------- Biometry  BPD:      84.4  mm     G. Age:  34w 0d         36  %    CI:        72.77   %    70 - 86  FL/HC:      20.8   %    19.4 - 21.8  HC:      314.6  mm     G. Age:  35w 2d         36  %    HC/AC:      1.05        0.96 - 1.11  AC:      298.6  mm     G. Age:  33w 6d         37  %    FL/BPD:     77.4   %    71 - 87  FL:       65.3  mm     G. Age:  33w 5d         22  %    FL/AC:      21.9   %    20 - 24  Est. FW:    2314  gm      5 lb 2 oz     31  % ---------------------------------------------------------------------- OB History  Gravidity:    3         Term:   1  ---------------------------------------------------------------------- Gestational Age  LMP:           34w 3d        Date:  07/25/20                 EDD:   05/01/21  U/S Today:     34w 2d                                        EDD:   05/02/21  Best:          34w 3d     Det. By:  LMP  (07/25/20)          EDD:   05/01/21 ---------------------------------------------------------------------- Anatomy  Cranium:               Appears normal         LVOT:                   Previously seen  Cavum:                 Previously seen        Aortic Arch:            Previously seen  Ventricles:            Appears normal         Ductal Arch:            Previously seen  Choroid Plexus:        Previously seen        Diaphragm:              Appears normal  Cerebellum:            Previously seen        Stomach:                Appears normal, left  sided  Posterior Fossa:       Previously seen        Abdomen:                Previously seen  Nuchal Fold:           Previously seen        Abdominal Wall:         Previously seen  Face:                  Orbits and profile     Cord Vessels:           Previously seen                         previously seen  Lips:                  Previously seen        Kidneys:                Appear normal  Palate:                Not well visualized    Bladder:                Appears normal  Thoracic:              Previously seen        Spine:                  Previously seen  Heart:                 Appears normal;        Upper Extremities:      Previously seen                         prev seen EIF  RVOT:                  Previously seen        Lower Extremities:      Previously seen  Other:  Female gender previously seen.Nasal bone, lenses, 3VV previously          visualized. Technically difficult due to maternal habitus and fetal          position. Hands and Heels previously visualized.  ---------------------------------------------------------------------- Comments  This patient was seen for a biophysical profile and growth  scan due to chronic hypertension treated with labetalol and  obesity with a BMI of 49.  She denies any problems since her  last exam.  She was informed that the fetal growth and amniotic fluid  level appeared appropriate for her gestational age.  A biophysical profile performed today was 8 out of 8.  She will return in 1 week for another BPP.  She already has an induction of labor scheduled on April 17, 2021. ----------------------------------------------------------------------                   Colleen Rings, MD Electronically Signed Final Report   03/23/2021 09:02 am ----------------------------------------------------------------------  [place] MFM FETAL BPP WO NON STRESS  Result Date: 03/09/2021 ----------------------------------------------------------------------  OBSTETRICS REPORT                       (Signed Final 03/09/2021 08:37 am) ---------------------------------------------------------------------- Patient Info  ID #:       [id number]  D.O.B.:  02/11/1995 (26 yrs)  Name:       TIMMIA VIDAL               Visit Date: 03/09/2021 08:05 am ---------------------------------------------------------------------- Performed By  Attending:        Ma Rings MD         Secondary Phy.:   Colleen A LEFTWICH-                                                             KIRBY CNM  Performed By:     Emeline Darling BS,      Address:          [address]                    [address]                                                             [address]                                                             [place]  Referred By:      Colleen Stone              [facility]                    Colleen Stone                                   [facility] ---------------------------------------------------------------------- Orders  #  Description                           Code        Ordered By  1  [place] MFM FETAL BPP WO NON               82956.21    Colleen Stone     STRESS ----------------------------------------------------------------------  #  Order #  Accession #                Episode #  1  [id number]                   [id number]                 [id number] ---------------------------------------------------------------------- Indications  Obesity complicating pregnancy, third          O99.213  trimester  Pre-existing hypertension with preeclampsia,   O11.3  third trimester  Echogenic intracardiac focus of the heart      O35.8XX0  (EIF)  Seizure disorder                               O99.350 G40.909  Genetic carrier (alpha thal silent carrier)    Z14.8  [redacted] weeks gestation of pregnancy                Z3A.32  LR NIPS ---------------------------------------------------------------------- Fetal Evaluation  Num Of Fetuses:         1  Fetal Heart Rate(bpm):  153  Cardiac Activity:       Observed  Presentation:           Cephalic  Placenta:               Posterior  P. Cord Insertion:      Previously Visualized  Amniotic Fluid  AFI FV:      Within normal limits  AFI Sum(cm)     %Tile       Largest Pocket(cm)  14.8            52          5.1  RUQ(cm)       RLQ(cm)       LUQ(cm)        LLQ(cm)  5.1           3.1           2.9            3.7 ---------------------------------------------------------------------- Biophysical Evaluation  Amniotic F.V:   Pocket => 2 cm             F. Tone:        Observed  F. Movement:    Observed                   Score:          8/8  F. Breathing:   Observed ---------------------------------------------------------------------- OB History  Gravidity:    3         Term:   1 ---------------------------------------------------------------------- Gestational Age  LMP:           32w 3d         Date:  07/25/20                 EDD:   05/01/21  Best:          Armida Sans 3d     Det. By:  LMP  (07/25/20)          EDD:   05/01/21 ---------------------------------------------------------------------- Anatomy  Diaphragm:             Appears normal         Bladder:                Appears normal  Stomach:  Appears normal, left                         sided ---------------------------------------------------------------------- Comments  This patient was seen for a biophysical profile due to chronic  hypertension treated with labetalol and obesity with a BMI of  49.  She denies any problems since her last exam.  A biophysical profile performed today was 8 out of 8.  There was normal amniotic fluid noted on today's ultrasound  exam.  She will return in 1 week for another BPP. ----------------------------------------------------------------------                   Colleen Rings, MD Electronically Signed Final Report   03/09/2021 08:37 am ----------------------------------------------------------------------  [place] MFM OB FOLLOW UP  Result Date: 03/23/2021 ----------------------------------------------------------------------  OBSTETRICS REPORT                       (Signed Final 03/23/2021 09:02 am) ---------------------------------------------------------------------- Patient Info  ID #:       [id number]                          D.O.B.:  June 16, 1995 (26 yrs)  Name:       ARLICIA Reim               Visit Date: 03/23/2021 08:10 am ---------------------------------------------------------------------- Performed By  Attending:        Ma Rings MD         Secondary Phy.:   Colleen A LEFTWICH-                                                             KIRBY CNM  Performed By:     Sandi Mealy        Address:          [address]                    [address]                                                             [address]                                                             [place]  Referred By:       Colleen Stone              [facility]                    Colleen Stone                                   [facility] ---------------------------------------------------------------------- Orders  #  Description                           Code        Ordered By  1  [place] MFM FETAL BPP WO NON               [id number]    Colleen Stone     STRESS  2  [place] MFM OB FOLLOW UP                   [id number]    Colleen Stone ----------------------------------------------------------------------  #  Order #                     Accession #                Episode #  1  [id number]                   [id number]                 [id number]  2  [id number]                   [id number]                 [id number] ---------------------------------------------------------------------- Indications  Obesity complicating pregnancy, third          O99.213  trimester  Pre-existing hypertension with preeclampsia,   O11.3  third trimester  Echogenic intracardiac focus of the heart      O35.8XX0  (EIF)  Seizure disorder                               O99.350 G40.909  Genetic carrier (alpha thal silent carrier)    Z14.8  [redacted] weeks gestation of pregnancy                Z3A.34  Encounter for other antenatal screening        Z36.2  follow-up  LR NIPS ---------------------------------------------------------------------- Fetal Evaluation  Num Of Fetuses:         1  Fetal Heart Rate(bpm):  155  Cardiac Activity:       Observed  Presentation:           Cephalic  Placenta:               Posterior  P. Cord Insertion:      Previously Visualized  Amniotic Fluid  AFI FV:      Within normal limits  AFI Sum(cm)     %Tile       Largest Pocket(cm)  17.59           65          6.56  RUQ(cm)       RLQ(cm)       LUQ(cm)        LLQ(cm)  6.56          3.69          3.33  4.01  ---------------------------------------------------------------------- Biophysical Evaluation  Amniotic F.V:   Within normal limits       F. Tone:        Observed  F. Movement:    Observed                   Score:          8/8  F. Breathing:   Observed ---------------------------------------------------------------------- Biometry  BPD:      84.4  mm     G. Age:  34w 0d         36  %    CI:        72.77   %    70 - 86                                                          FL/HC:      20.8   %    19.4 - 21.8  HC:      314.6  mm     G. Age:  35w 2d         36  %    HC/AC:      1.05        0.96 - 1.11  AC:      298.6  mm     G. Age:  33w 6d         37  %    FL/BPD:     77.4   %    71 - 87  FL:       65.3  mm     G. Age:  33w 5d         22  %    FL/AC:      21.9   %    20 - 24  Est. FW:    2314  gm      5 lb 2 oz     31  % ---------------------------------------------------------------------- OB History  Gravidity:    3         Term:   1 ---------------------------------------------------------------------- Gestational Age  LMP:           34w 3d        Date:  07/25/20                 EDD:   05/01/21  U/S Today:     34w 2d                                        EDD:   05/02/21  Best:          34w 3d     Det. By:  LMP  (07/25/20)          EDD:   05/01/21 ---------------------------------------------------------------------- Anatomy  Cranium:               Appears normal         LVOT:                   Previously seen  Cavum:                 Previously seen  Aortic Arch:            Previously seen  Ventricles:            Appears normal         Ductal Arch:            Previously seen  Choroid Plexus:        Previously seen        Diaphragm:              Appears normal  Cerebellum:            Previously seen        Stomach:                Appears normal, left                                                                        sided  Posterior Fossa:       Previously seen        Abdomen:                Previously  seen  Nuchal Fold:           Previously seen        Abdominal Wall:         Previously seen  Face:                  Orbits and profile     Cord Vessels:           Previously seen                         previously seen  Lips:                  Previously seen        Kidneys:                Appear normal  Palate:                Not well visualized    Bladder:                Appears normal  Thoracic:              Previously seen        Spine:                  Previously seen  Heart:                 Appears normal;        Upper Extremities:      Previously seen                         prev seen EIF  RVOT:                  Previously seen        Lower Extremities:      Previously seen  Other:  Female gender previously seen.Nasal bone, lenses, 3VV previously          visualized. Technically difficult due to maternal habitus and fetal  position. Hands and Heels previously visualized. ---------------------------------------------------------------------- Comments  This patient was seen for a biophysical profile and growth  scan due to chronic hypertension treated with labetalol and  obesity with a BMI of 49.  She denies any problems since her  last exam.  She was informed that the fetal growth and amniotic fluid  level appeared appropriate for her gestational age.  A biophysical profile performed today was 8 out of 8.  She will return in 1 week for another BPP.  She already has an induction of labor scheduled on April 17, 2021. ----------------------------------------------------------------------                   Colleen Rings, MD Electronically Signed Final Report   03/23/2021 09:02 am ----------------------------------------------------------------------   Assessment and Plan:  Pregnancy: G3P1011 at [redacted]w[redacted]d 1. Supervision of high risk pregnancy, antepartum Patient is doing well without complaints  2. Chronic hypertension complicating or reason for care during pregnancy, second trimester BP stable  Continue  labetalol  which has been increased to 200 BID due to elevated BP reading this morning and over the weekend 160/90 without preeclampsia symptoms Continue ASA Follow up BPP on 3/24 Patient scheduled for IOL at 38 weeks  3. Group B streptococcal bacteriuria Prophylaxis in labor  4. BMI 45.0-49.9, adult (HCC) Continue ASA  Preterm labor symptoms and general obstetric precautions including but not limited to vaginal bleeding, contractions, leaking of fluid and fetal movement were reviewed in detail with the patient. I discussed the assessment and treatment plan with the patient. The patient was provided an opportunity to ask questions and all were answered. The patient agreed with the plan and demonstrated an understanding of the instructions. The patient was advised to call back or seek an in-person office evaluation/go to MAU at [facility] for any urgent or concerning symptoms. Please refer to After Visit Summary for other counseling recommendations.   I provided 15 minutes of face-to-face time during this encounter.  Return in about 1 week (around 04/09/2021) for in person, Virtual, ROB, High risk.  Future Appointments  Date Time Provider Department Center  04/06/2021  7:30 AM [facility] NURSE [facility]  04/06/2021  7:45 AM [facility] US5 [facility]  04/13/2021  7:30 AM [facility] NURSE [facility]  04/13/2021  7:45 AM [facility] US4 [facility]  04/17/2021  6:30 AM MC-LD SCHED ROOM MC-INDC None    Catalina Antigua, MD [facility] for [employer], [facility]

## 2021-04-02 NOTE — Progress Notes (Signed)
ROB 35.[redacted] wks GA, MyChart visit ?Reports irregular UC's and some "spotting" ?Reports swelling in feet and ankles that does not go away after resting ?Will check BP prior to connecting with MD ?

## 2021-04-03 ENCOUNTER — Telehealth (HOSPITAL_COMMUNITY): Payer: Self-pay | Admitting: *Deleted

## 2021-04-03 NOTE — Telephone Encounter (Signed)
Preadmission screen  

## 2021-04-04 ENCOUNTER — Telehealth (HOSPITAL_COMMUNITY): Payer: Self-pay | Admitting: *Deleted

## 2021-04-04 ENCOUNTER — Encounter (HOSPITAL_COMMUNITY): Payer: Self-pay

## 2021-04-04 NOTE — Telephone Encounter (Signed)
Preadmission screen  

## 2021-04-05 ENCOUNTER — Inpatient Hospital Stay (HOSPITAL_COMMUNITY)
Admission: AD | Admit: 2021-04-05 | Discharge: 2021-04-05 | Disposition: A | Discharge status: Home / Self Care | Payer: Medicaid Other | Attending: Family Medicine | Admitting: Family Medicine

## 2021-04-05 ENCOUNTER — Other Ambulatory Visit: Payer: Self-pay

## 2021-04-05 ENCOUNTER — Telehealth: Payer: Self-pay

## 2021-04-05 ENCOUNTER — Encounter (HOSPITAL_COMMUNITY): Payer: Self-pay | Admitting: Family Medicine

## 2021-04-05 DIAGNOSIS — O10919 Unspecified pre-existing hypertension complicating pregnancy, unspecified trimester: Secondary | ICD-10-CM

## 2021-04-05 DIAGNOSIS — Z3A36 36 weeks gestation of pregnancy: Secondary | ICD-10-CM

## 2021-04-05 DIAGNOSIS — Z0371 Encounter for suspected problem with amniotic cavity and membrane ruled out: Secondary | ICD-10-CM | POA: Insufficient documentation

## 2021-04-05 DIAGNOSIS — O10913 Unspecified pre-existing hypertension complicating pregnancy, third trimester: Secondary | ICD-10-CM | POA: Insufficient documentation

## 2021-04-05 LAB — URINALYSIS, ROUTINE W REFLEX MICROSCOPIC
Bacteria, UA: NONE SEEN
Bilirubin Urine: NEGATIVE
Glucose, UA: NEGATIVE mg/dL
Hgb urine dipstick: NEGATIVE
Ketones, ur: 20 mg/dL — AB
Nitrite: NEGATIVE
Protein, ur: NEGATIVE mg/dL
Specific Gravity, Urine: 1.014 (ref 1.005–1.030)
pH: 6 (ref 5.0–8.0)

## 2021-04-05 LAB — COMPREHENSIVE METABOLIC PANEL
ALT: 17 U/L (ref 0–44)
AST: 20 U/L (ref 15–41)
Albumin: 2.7 g/dL — ABNORMAL LOW (ref 3.5–5.0)
Alkaline Phosphatase: 178 U/L — ABNORMAL HIGH (ref 38–126)
Anion gap: 11 (ref 5–15)
BUN: <5 mg/dL — ABNORMAL LOW (ref 6–20)
CO2: 20 mmol/L — ABNORMAL LOW (ref 22–32)
Calcium: 9.1 mg/dL (ref 8.9–10.3)
Chloride: 107 mmol/L (ref 98–111)
Creatinine, Ser: 0.67 mg/dL (ref 0.44–1.00)
GFR, Estimated: >60 mL/min (ref >60)
Glucose, Bld: 68 mg/dL — ABNORMAL LOW (ref 70–99)
Potassium: 3.6 mmol/L (ref 3.5–5.1)
Sodium: 138 mmol/L (ref 135–145)
Total Bilirubin: 0.5 mg/dL (ref 0.3–1.2)
Total Protein: 6.5 g/dL (ref 6.5–8.1)

## 2021-04-05 LAB — PROTEIN / CREATININE RATIO, URINE
Creatinine, Urine: 216.91 mg/dL
Protein Creatinine Ratio: 0.08 mg/mg{Cre} (ref 0.00–0.15)
Total Protein, Urine: 17 mg/dL

## 2021-04-05 LAB — CBC
HCT: 32.4 % — ABNORMAL LOW (ref 36.0–46.0)
Hemoglobin: 10.5 g/dL — ABNORMAL LOW (ref 12.0–15.0)
MCH: 23.8 pg — ABNORMAL LOW (ref 26.0–34.0)
MCHC: 32.4 g/dL (ref 30.0–36.0)
MCV: 73.5 fL — ABNORMAL LOW (ref 80.0–100.0)
Platelets: 310 10*3/uL (ref 150–400)
RBC: 4.41 MIL/uL (ref 3.87–5.11)
RDW: 16.4 % — ABNORMAL HIGH (ref 11.5–15.5)
WBC: 8.8 10*3/uL (ref 4.0–10.5)
nRBC: 0 % (ref 0.0–0.2)

## 2021-04-05 LAB — AMNISURE RUPTURE OF MEMBRANE (ROM) NOT AT ARMC: Amnisure ROM: NEGATIVE

## 2021-04-05 LAB — WET PREP, GENITAL
Clue Cells Wet Prep HPF POC: NONE SEEN
Sperm: NONE SEEN
Trich, Wet Prep: NONE SEEN
WBC, Wet Prep HPF POC: <10 (ref <10)
Yeast Wet Prep HPF POC: NONE SEEN

## 2021-04-05 NOTE — MAU Provider Note (Signed)
?History  ?  ? ?[name]: [id number] ? ?Arrival date and time: 04/05/21 2003 ? ? Event Date/Time  ? First Provider Initiated Contact with Patient 04/05/21 2130   ?  ? ?Chief Complaint  ?Patient presents with  ? Hypertension  ? ?HPI ?Colleen Stone is a 26 y.o. G3P1011 at [redacted]w[redacted]d who presents stating her blood pressure at home was elevated to 141/100. She reports a headache before arrival but none now. She reports she ate upon arrival and that fixed her headache. She denies any visual changes or epigastric pain. She reports pain when the baby moves. She also reports an increase in discharge last night but none today. She is requesting to make sure her water isn't broke.  ? ?OB History   ? ? Gravida  ?3  ? Para  ?1  ? Term  ?1  ? Preterm  ?   ? AB  ?1  ? Living  ?1  ?  ? ? SAB  ?1  ? IAB  ?   ? Ectopic  ?   ? Multiple  ?0  ? Live Births  ?1  ?   ?  ?  ? ? ?Past Medical History:  ?Diagnosis Date  ? Anxiety   ? Asthma   ? Chlamydia   ? Gonorrhea   ? Hypertension   ? takes labetalol  ? MRSA infection   ? 8th grade  ? Other acne 06/30/2008  ? Overview:  Acne  ? PCOS (polycystic ovarian syndrome)   ? Seizures (HCC)   ? Last seizure in 2017  ? Trichomonas infection   ? ? ?Past Surgical History:  ?Procedure Laterality Date  ? ADENOIDECTOMY    ? NASAL SEPTOPLASTY W/ TURBINOPLASTY    ? ROOT CANAL    ? TONSILLECTOMY    ? WISDOM TOOTH EXTRACTION    ? ? ?Family History  ?Problem Relation Age of Onset  ? Asthma Mother   ? Heart disease Mother   ?     murmur  ? Hypertension Mother   ? Heart disease Father   ? ? ?Social History  ? ?Tobacco Use  ? Smoking status: Former  ?  Types: Cigarettes  ?  Quit date: 10/03/2017  ?  Years since quitting: 3.5  ? Smokeless tobacco: Never  ?Vaping Use  ? Vaping Use: Former  ? Substances: Nicotine, Flavoring  ?Substance Use Topics  ? Alcohol use: Not Currently  ?  Comment: not since pregnancy  ? Drug use: Not Currently  ?  Types: Marijuana  ?  Comment: Last marijuana 09/08/20  ? ? ?Allergies:  ?Allergies   ?Allergen Reactions  ? Red Dye Nausea And Vomiting and Other (See Comments)  ?  Her body rejects it  ? Latex Hives and Swelling  ? ? ?Medications Prior to Admission  ?Medication Sig Dispense Refill Last Dose  ? aspirin EC 81 MG tablet Take 1 tablet (81 mg total) by mouth daily. Take after 12 weeks for prevention of preeclampsia later in pregnancy 300 tablet 2 04/04/2021  ? butalbital-acetaminophen-caffeine (FIORICET) 50-325-40 MG tablet Take 1-2 tablets by mouth every 6 (six) hours as needed for headache. 20 tablet 0 04/05/2021 at 0200  ? labetalol (NORMODYNE) 200 MG tablet Take 1 tablet (200 mg total) by mouth 2 (two) times daily. 60 tablet 3 04/05/2021 at 0600  ? pantoprazole (PROTONIX) 40 MG tablet Take 1 tablet (40 mg total) by mouth daily. 30 tablet 5 04/05/2021  ? Prenatal Vit-Fe Phos-FA-Omega (VITAFOL GUMMIES) 3.33-0.333-34.8 MG CHEW Chew  3 tablets by mouth daily. 90 tablet 11 04/05/2021  ? albuterol (PROVENTIL) (2.5 MG/3ML) 0.083% nebulizer solution Take 3 mLs (2.5 mg total) by nebulization every 6 (six) hours as needed for wheezing or shortness of breath. (Patient not taking: Reported on 02/12/2021) 150 mL 1   ? albuterol (VENTOLIN HFA) 108 (90 Base) MCG/ACT inhaler Inhale 2 puffs into the lungs every 6 (six) hours as needed for wheezing or shortness of breath. (Patient not taking: Reported on 02/12/2021) 18 g 4   ? clotrimazole (LOTRIMIN) 1 % cream Apply to affected area 2 times daily (Patient not taking: Reported on 02/12/2021) 45 g 0   ? cyclobenzaprine (FLEXERIL) 5 MG tablet Take 1-2 tablets (5-10 mg total) by mouth 3 (three) times daily as needed for muscle spasms. 30 tablet 0   ? famotidine (PEPCID) 20 MG tablet Take 1 tablet (20 mg total) by mouth 2 (two) times daily as needed for heartburn or indigestion. (Patient not taking: Reported on 02/16/2021) 30 tablet 2   ? labetalol (NORMODYNE) 100 MG tablet TAKE 1 TABLET(100 MG) BY MOUTH TWICE DAILY 60 tablet 0   ? Misc. Devices (GOJJI WEIGHT SCALE) MISC 1 Device  by Does not apply route once a week. (Patient not taking: Reported on 02/12/2021) 1 each 0   ? mupirocin ointment (BACTROBAN) 2 % Apply 1 application topically 2 (two) times daily. (Patient not taking: Reported on 02/12/2021) 22 g 0   ? nystatin (MYCOSTATIN/NYSTOP) powder Apply 1 application topically 3 (three) times daily. 15 g 0   ? ondansetron (ZOFRAN ODT) 8 MG disintegrating tablet Take 1 tablet (8 mg total) by mouth every 8 (eight) hours as needed for nausea or vomiting. (Patient not taking: Reported on 02/12/2021) 60 tablet 1   ? promethazine (PHENERGAN) 12.5 MG tablet Take 1-2 tablets (12.5-25 mg total) by mouth every 6 (six) hours as needed for nausea or vomiting. (Patient not taking: Reported on 02/27/2021) 30 tablet 3   ? terconazole (TERAZOL 7) 0.4 % vaginal cream Place 1 applicator vaginally at bedtime. 45 g 0   ? traMADol (ULTRAM) 50 MG tablet Take 1-2 tablets (50-100 mg total) by mouth every 6 (six) hours as needed. (Patient not taking: Reported on 02/12/2021) 15 tablet 0   ? ? ?Review of Systems  ?Constitutional: Negative.  Negative for fatigue and fever.  ?HENT: Negative.    ?Respiratory: Negative.  Negative for shortness of breath.   ?Cardiovascular: Negative.  Negative for chest pain.  ?Gastrointestinal: Negative.  Negative for abdominal pain, constipation, diarrhea, nausea and vomiting.  ?Genitourinary: Negative.  Negative for dysuria, vaginal bleeding and vaginal discharge.  ?Neurological: Negative.  Negative for dizziness and headaches.  ?Physical Exam  ? ?Blood pressure (!) 127/93, pulse (!) 108, temperature 98 ?F (36.7 ?C), resp. rate 18, height 5' 4.5" (1.638 m), weight 130.6 kg, last menstrual period 07/25/2020, SpO2 100 %. ? ?Patient Vitals for the past 24 hrs: ? BP Temp Pulse Resp SpO2 Height Weight  ?04/05/21 2215 (!) 141/86 -- (!) 108 -- 100 % -- --  ?04/05/21 2150 (!) 122/91 -- (!) 118 -- 100 % -- --  ?04/05/21 2130 (!) 134/96 -- (!) 115 -- 100 % -- --  ?04/05/21 2115 (!) 127/93 -- (!) 108  -- 100 % -- --  ?04/05/21 2100 121/81 -- 96 -- 100 % -- --  ?04/05/21 2040 135/88 -- (!) 126 -- -- -- --  ?04/05/21 2020 121/90 -- -- -- -- -- --  ?04/05/21 2015 -- 98 ?F (36.7 ?  C) (!) 109 18 100 % 5' 4.5" (1.638 m) 130.6 kg  ? ?Physical Exam ?Vitals and nursing note reviewed.  ?Constitutional:   ?   General: She is not in acute distress. ?   Appearance: She is well-developed.  ?HENT:  ?   Head: Normocephalic.  ?Eyes:  ?   Pupils: Pupils are equal, round, and reactive to light.  ?Cardiovascular:  ?   Rate and Rhythm: Normal rate and regular rhythm.  ?   Heart sounds: Normal heart sounds.  ?Pulmonary:  ?   Effort: Pulmonary effort is normal. No respiratory distress.  ?   Breath sounds: Normal breath sounds.  ?Abdominal:  ?   General: Bowel sounds are normal. There is no distension.  ?   Palpations: Abdomen is soft.  ?   Tenderness: There is no abdominal tenderness.  ?Skin: ?   General: Skin is warm and dry.  ?Neurological:  ?   Mental Status: She is alert and oriented to person, place, and time.  ?   Motor: No abnormal muscle tone.  ?   Coordination: Coordination normal.  ?   Deep Tendon Reflexes: Reflexes are normal and symmetric. Reflexes normal.  ?Psychiatric:     ?   Behavior: Behavior normal.     ?   Thought Content: Thought content normal.     ?   Judgment: Judgment normal.  ? ?Fetal Tracing: ? ?Baseline: 150 ?Variability: moderate ?Accels: 15x15 ?Decels: none ? ?Toco: UI ? ?MAU Course  ?Procedures ?Results for orders placed or performed during the hospital encounter of 04/05/21 (from the past 24 hour(s))  ?Urinalysis, Routine w reflex microscopic     Status: Abnormal  ? Collection Time: 04/05/21  8:46 PM  ?Result Value Ref Range  ? Color, Urine YELLOW YELLOW  ? APPearance CLEAR CLEAR  ? Specific Gravity, Urine 1.014 1.005 - 1.030  ? pH 6.0 5.0 - 8.0  ? Glucose, UA NEGATIVE NEGATIVE mg/dL  ? Hgb urine dipstick NEGATIVE NEGATIVE  ? Bilirubin Urine NEGATIVE NEGATIVE  ? Ketones, ur 20 (A) NEGATIVE mg/dL  ?  Protein, ur NEGATIVE NEGATIVE mg/dL  ? Nitrite NEGATIVE NEGATIVE  ? Leukocytes,Ua TRACE (A) NEGATIVE  ? RBC / HPF 0-5 0 - 5 RBC/hpf  ? WBC, UA 0-5 0 - 5 WBC/hpf  ? Bacteria, UA NONE SEEN NONE SEEN  ? Squamous Ep

## 2021-04-05 NOTE — Telephone Encounter (Signed)
Pt had called Nurse Line asking if she should still go to MAU regarding her elevated BP, she stated that she was advised to go earlier but because she didn't have child care & she was afraid that they might keep her. I advised Pt to go to MAU, she wanted to know if it was going to be a long waite, so I gave her their direct phone number. ?

## 2021-04-05 NOTE — Discharge Instructions (Signed)

## 2021-04-05 NOTE — MAU Note (Addendum)
My b/p was 141/101 at 0130. I have had a h/a for 2 days. Have tried Fioricet but did not help. I am for IOL 04/17/21. Good FM. Denies LOF. Some pink spotting. Sensitive to light and some nausea ?

## 2021-04-06 ENCOUNTER — Ambulatory Visit: Payer: Medicaid Other | Attending: Obstetrics

## 2021-04-06 ENCOUNTER — Ambulatory Visit: Payer: Medicaid Other | Admitting: *Deleted

## 2021-04-06 ENCOUNTER — Encounter: Payer: Self-pay | Admitting: *Deleted

## 2021-04-06 VITALS — BP 120/79 | HR 105

## 2021-04-06 DIAGNOSIS — R638 Other symptoms and signs concerning food and fluid intake: Secondary | ICD-10-CM | POA: Diagnosis present

## 2021-04-06 DIAGNOSIS — O0990 Supervision of high risk pregnancy, unspecified, unspecified trimester: Secondary | ICD-10-CM

## 2021-04-06 DIAGNOSIS — E669 Obesity, unspecified: Secondary | ICD-10-CM | POA: Diagnosis not present

## 2021-04-06 DIAGNOSIS — G40909 Epilepsy, unspecified, not intractable, without status epilepticus: Secondary | ICD-10-CM | POA: Diagnosis present

## 2021-04-06 DIAGNOSIS — O10913 Unspecified pre-existing hypertension complicating pregnancy, third trimester: Secondary | ICD-10-CM | POA: Diagnosis not present

## 2021-04-06 DIAGNOSIS — O99213 Obesity complicating pregnancy, third trimester: Secondary | ICD-10-CM | POA: Diagnosis not present

## 2021-04-06 DIAGNOSIS — Z3A36 36 weeks gestation of pregnancy: Secondary | ICD-10-CM

## 2021-04-06 DIAGNOSIS — O99353 Diseases of the nervous system complicating pregnancy, third trimester: Secondary | ICD-10-CM | POA: Insufficient documentation

## 2021-04-09 ENCOUNTER — Encounter (HOSPITAL_COMMUNITY): Payer: Self-pay | Admitting: *Deleted

## 2021-04-10 ENCOUNTER — Telehealth (INDEPENDENT_AMBULATORY_CARE_PROVIDER_SITE_OTHER): Payer: Medicaid Other | Admitting: Obstetrics & Gynecology

## 2021-04-10 ENCOUNTER — Other Ambulatory Visit: Payer: Self-pay

## 2021-04-10 VITALS — BP 125/93 | HR 118 | Wt 280.0 lb

## 2021-04-10 DIAGNOSIS — O10913 Unspecified pre-existing hypertension complicating pregnancy, third trimester: Secondary | ICD-10-CM

## 2021-04-10 DIAGNOSIS — O0993 Supervision of high risk pregnancy, unspecified, third trimester: Secondary | ICD-10-CM | POA: Diagnosis not present

## 2021-04-10 DIAGNOSIS — O99213 Obesity complicating pregnancy, third trimester: Secondary | ICD-10-CM

## 2021-04-10 DIAGNOSIS — O10912 Unspecified pre-existing hypertension complicating pregnancy, second trimester: Secondary | ICD-10-CM

## 2021-04-10 DIAGNOSIS — Z3A37 37 weeks gestation of pregnancy: Secondary | ICD-10-CM

## 2021-04-10 DIAGNOSIS — O99820 Streptococcus B carrier state complicating pregnancy: Secondary | ICD-10-CM

## 2021-04-10 DIAGNOSIS — R8271 Bacteriuria: Secondary | ICD-10-CM | POA: Diagnosis not present

## 2021-04-10 DIAGNOSIS — O0990 Supervision of high risk pregnancy, unspecified, unspecified trimester: Secondary | ICD-10-CM

## 2021-04-10 DIAGNOSIS — Z6841 Body Mass Index (BMI) 40.0 and over, adult: Secondary | ICD-10-CM

## 2021-04-10 NOTE — Progress Notes (Signed)
? ? ?  TELEHEALTH OBSTETRICS VISIT ENCOUNTER NOTE ? ?Provider location: [facility] [employer] at [place]  ? ?Patient location: Home ? ?I connected with Colleen Stone on 04/10/21 at  3:50 PM EDT by telephone at home and verified that I am speaking with the correct person using two identifiers. Of note, unable to do video encounter due to technical difficulties.  ?  ?I discussed the limitations, risks, security and privacy concerns of performing an evaluation and management service by telephone and the availability of in person appointments. I also discussed with the patient that there may be a patient responsible charge related to this service. The patient expressed understanding and agreed to proceed. ? ?Subjective:  ?Colleen Stone is a 26 y.o. G3P1011 at [redacted]w[redacted]d being followed for ongoing prenatal care.  She is currently monitored for the following issues for this high-risk pregnancy and has Bipolar 1 disorder (HCC); Family history of breast cancer; Family history of schizophrenia; Smoker; Chronic hypertension complicating or reason for care during pregnancy, second trimester; History of seizure disorder; Hypertension; Atypical squamous cells of undetermined significance (ASCUS) on Papanicolaou smear of cervix; Gonorrhea affecting pregnancy in first trimester; Supervision of high risk pregnancy, antepartum; Group B streptococcal bacteriuria; Alpha thalassemia silent carrier; and BMI 45.0-49.9, adult (HCC) on their problem list. ? ?Patient reports occasional contractions. Reports fetal movement. Denies any contractions, bleeding or leaking of fluid.  ? ?The following portions of the patient's history were reviewed and updated as appropriate: allergies, current medications, past family history, past medical history, past social history, past surgical history and problem list.  ? ?Objective:  ?Blood pressure (!) 125/93, pulse (!) 118, weight 280 lb (127 kg), last menstrual period 07/25/2020. ?General:   Alert, oriented and cooperative.   ?Mental Status: Normal mood and affect perceived. Normal judgment and thought content.  ?Rest of physical exam deferred due to type of encounter ? ?Assessment and Plan:  ?Pregnancy: G3P1011 at 94w0dno  ?1. Supervision of high risk pregnancy, antepartum ?Has weekly BPP  ? ?2. BMI 45.0-49.9, adult (HCC) ?Body mass index is 47.32 kg/m?. ? ? ?3. Chronic hypertension complicating or reason for care during pregnancy, second trimester ?No severe range BP ? ?4. Group B streptococcal bacteriuria ?Abx in labor ? ?Term labor symptoms and general obstetric precautions including but not limited to vaginal bleeding, contractions, leaking of fluid and fetal movement were reviewed in detail with the patient.  ?I discussed the assessment and treatment plan with the patient. The patient was provided an opportunity to ask questions and all were answered. The patient agreed with the plan and demonstrated an understanding of the instructions. The patient was advised to call back or seek an in-person office evaluation/go to MAU at [facility] for any urgent or concerning symptoms. ?Please refer to After Visit Summary for other counseling recommendations.  ? ?I provided 10 minutes of non-face-to-face time during this encounter. ? ?Return if symptoms worsen or fail to improve. ? ?Future Appointments  ?Date Time Provider Department Center  ?04/13/2021  7:30 AM [facility] NURSE [facility]  ?04/13/2021  7:45 AM [facility] US4 [facility]  ?04/17/2021  6:30 AM MC-LD SCHED ROOM MC-INDC None  ? ? ?Scheryl Darter, MD ?Center for [facility], [facility] ? ? ? ?

## 2021-04-10 NOTE — Progress Notes (Signed)
I connected with  Colleen Stone on 04/10/21 by a video enabled telemedicine application and verified that I am speaking with the correct person using two identifiers. ?  ?I discussed the limitations of evaluation and management by telemedicine. The patient expressed understanding and agreed to proceed.  ? ?[facility] OB, reports no problems today. ?

## 2021-04-11 ENCOUNTER — Other Ambulatory Visit: Payer: Self-pay | Admitting: Advanced Practice Midwife

## 2021-04-13 ENCOUNTER — Ambulatory Visit: Payer: Medicaid Other | Attending: Obstetrics

## 2021-04-13 ENCOUNTER — Ambulatory Visit: Payer: Medicaid Other

## 2021-04-17 ENCOUNTER — Encounter (HOSPITAL_COMMUNITY): Payer: Self-pay | Admitting: Family Medicine

## 2021-04-17 ENCOUNTER — Other Ambulatory Visit: Payer: Self-pay

## 2021-04-17 ENCOUNTER — Inpatient Hospital Stay (HOSPITAL_COMMUNITY)
Admission: AD | Admit: 2021-04-17 | Discharge: 2021-04-19 | DRG: 806 | Disposition: A | Discharge status: Home / Self Care | Payer: Medicaid Other | Attending: Obstetrics & Gynecology | Admitting: Obstetrics & Gynecology

## 2021-04-17 ENCOUNTER — Inpatient Hospital Stay (HOSPITAL_COMMUNITY): Payer: Medicaid Other

## 2021-04-17 DIAGNOSIS — Z3A38 38 weeks gestation of pregnancy: Secondary | ICD-10-CM

## 2021-04-17 DIAGNOSIS — Z8614 Personal history of Methicillin resistant Staphylococcus aureus infection: Secondary | ICD-10-CM | POA: Diagnosis not present

## 2021-04-17 DIAGNOSIS — O99354 Diseases of the nervous system complicating childbirth: Secondary | ICD-10-CM | POA: Diagnosis present

## 2021-04-17 DIAGNOSIS — G43109 Migraine with aura, not intractable, without status migrainosus: Secondary | ICD-10-CM | POA: Diagnosis present

## 2021-04-17 DIAGNOSIS — O99820 Streptococcus B carrier state complicating pregnancy: Secondary | ICD-10-CM | POA: Diagnosis not present

## 2021-04-17 DIAGNOSIS — O26893 Other specified pregnancy related conditions, third trimester: Secondary | ICD-10-CM | POA: Diagnosis present

## 2021-04-17 DIAGNOSIS — O10919 Unspecified pre-existing hypertension complicating pregnancy, unspecified trimester: Secondary | ICD-10-CM | POA: Diagnosis present

## 2021-04-17 DIAGNOSIS — O99824 Streptococcus B carrier state complicating childbirth: Secondary | ICD-10-CM | POA: Diagnosis present

## 2021-04-17 DIAGNOSIS — O0990 Supervision of high risk pregnancy, unspecified, unspecified trimester: Secondary | ICD-10-CM

## 2021-04-17 DIAGNOSIS — D563 Thalassemia minor: Secondary | ICD-10-CM | POA: Diagnosis present

## 2021-04-17 DIAGNOSIS — O10912 Unspecified pre-existing hypertension complicating pregnancy, second trimester: Secondary | ICD-10-CM | POA: Diagnosis present

## 2021-04-17 DIAGNOSIS — O1002 Pre-existing essential hypertension complicating childbirth: Principal | ICD-10-CM | POA: Diagnosis present

## 2021-04-17 DIAGNOSIS — Z8669 Personal history of other diseases of the nervous system and sense organs: Secondary | ICD-10-CM

## 2021-04-17 DIAGNOSIS — F319 Bipolar disorder, unspecified: Secondary | ICD-10-CM | POA: Diagnosis present

## 2021-04-17 DIAGNOSIS — Z87891 Personal history of nicotine dependence: Secondary | ICD-10-CM

## 2021-04-17 DIAGNOSIS — O99350 Diseases of the nervous system complicating pregnancy, unspecified trimester: Secondary | ICD-10-CM | POA: Diagnosis not present

## 2021-04-17 DIAGNOSIS — R8271 Bacteriuria: Secondary | ICD-10-CM | POA: Diagnosis present

## 2021-04-17 LAB — CBC
HCT: 30 % — ABNORMAL LOW (ref 36.0–46.0)
Hemoglobin: 9.3 g/dL — ABNORMAL LOW (ref 12.0–15.0)
MCH: 22.6 pg — ABNORMAL LOW (ref 26.0–34.0)
MCHC: 31 g/dL (ref 30.0–36.0)
MCV: 72.8 fL — ABNORMAL LOW (ref 80.0–100.0)
Platelets: 341 10*3/uL (ref 150–400)
RBC: 4.12 MIL/uL (ref 3.87–5.11)
RDW: 16.8 % — ABNORMAL HIGH (ref 11.5–15.5)
WBC: 9.9 10*3/uL (ref 4.0–10.5)
nRBC: 0 % (ref 0.0–0.2)

## 2021-04-17 LAB — COMPREHENSIVE METABOLIC PANEL
ALT: 12 U/L (ref 0–44)
AST: 18 U/L (ref 15–41)
Albumin: 2.6 g/dL — ABNORMAL LOW (ref 3.5–5.0)
Alkaline Phosphatase: 171 U/L — ABNORMAL HIGH (ref 38–126)
Anion gap: 10 (ref 5–15)
BUN: <5 mg/dL — ABNORMAL LOW (ref 6–20)
CO2: 20 mmol/L — ABNORMAL LOW (ref 22–32)
Calcium: 9.1 mg/dL (ref 8.9–10.3)
Chloride: 106 mmol/L (ref 98–111)
Creatinine, Ser: 0.7 mg/dL (ref 0.44–1.00)
GFR, Estimated: >60 mL/min (ref >60)
Glucose, Bld: 105 mg/dL — ABNORMAL HIGH (ref 70–99)
Potassium: 3.6 mmol/L (ref 3.5–5.1)
Sodium: 136 mmol/L (ref 135–145)
Total Bilirubin: 0.5 mg/dL (ref 0.3–1.2)
Total Protein: 6.3 g/dL — ABNORMAL LOW (ref 6.5–8.1)

## 2021-04-17 LAB — TYPE AND SCREEN
ABO/RH(D): A POS
Antibody Screen: NEGATIVE

## 2021-04-17 LAB — PROTEIN / CREATININE RATIO, URINE
Creatinine, Urine: 213.52 mg/dL
Protein Creatinine Ratio: 0.06 mg/mg{Cre} (ref 0.00–0.15)
Total Protein, Urine: 12 mg/dL

## 2021-04-17 MED ORDER — ACETAMINOPHEN 325 MG PO TABS: 650.0000 mg | ORAL_TABLET | ORAL | Status: DC | PRN

## 2021-04-17 MED ORDER — MEASLES, MUMPS & RUBELLA VAC IJ SOLR
0.5000 mL | Freq: Once | INTRAMUSCULAR | Status: DC
Start: 2021-04-18 — End: 2021-04-19

## 2021-04-17 MED ORDER — POLYETHYLENE GLYCOL 3350 17 G PO PACK
17.0000 g | PACK | Freq: Every day | ORAL | Status: DC
  Administered 2021-04-18 – 2021-04-19 (×2): 17 g via ORAL
  Filled 2021-04-17 (×2): qty 1

## 2021-04-17 MED ORDER — LACTATED RINGERS IV SOLN: INTRAVENOUS | Status: DC

## 2021-04-17 MED ORDER — FENTANYL CITRATE (PF) 100 MCG/2ML IJ SOLN
INTRAMUSCULAR | Status: AC
  Administered 2021-04-17: 100 ug via INTRAVENOUS
  Filled 2021-04-17: qty 2

## 2021-04-17 MED ORDER — MEDROXYPROGESTERONE ACETATE 150 MG/ML IM SUSP: 150.0000 mg | INTRAMUSCULAR | Status: DC | PRN

## 2021-04-17 MED ORDER — LACTATED RINGERS IV SOLN: 500.0000 mL | INTRAVENOUS | Status: DC | PRN

## 2021-04-17 MED ORDER — SODIUM CHLORIDE 0.9 % IV SOLN
5.0000 10*6.[IU] | Freq: Once | INTRAVENOUS | Status: AC
  Administered 2021-04-17: 5 10*6.[IU] via INTRAVENOUS
  Filled 2021-04-17: qty 5

## 2021-04-17 MED ORDER — OXYTOCIN-SODIUM CHLORIDE 30-0.9 UT/500ML-% IV SOLN
1.0000 m[IU]/min | INTRAVENOUS | Status: DC
  Administered 2021-04-17: 2 m[IU]/min via INTRAVENOUS

## 2021-04-17 MED ORDER — DIBUCAINE (PERIANAL) 1 % EX OINT: 1.0000 "application " | TOPICAL_OINTMENT | CUTANEOUS | Status: DC | PRN

## 2021-04-17 MED ORDER — TERBUTALINE SULFATE 1 MG/ML IJ SOLN: 0.2500 mg | Freq: Once | INTRAMUSCULAR | Status: DC | PRN

## 2021-04-17 MED ORDER — NIFEDIPINE ER OSMOTIC RELEASE 30 MG PO TB24: 30.0000 mg | ORAL_TABLET | Freq: Every day | ORAL | Status: DC

## 2021-04-17 MED ORDER — BENZOCAINE-MENTHOL 20-0.5 % EX AERO
1.0000 "application " | INHALATION_SPRAY | CUTANEOUS | Status: DC | PRN
  Administered 2021-04-17: 1 via TOPICAL
  Filled 2021-04-17: qty 56

## 2021-04-17 MED ORDER — OXYCODONE-ACETAMINOPHEN 5-325 MG PO TABS: 2.0000 | ORAL_TABLET | ORAL | Status: DC | PRN

## 2021-04-17 MED ORDER — COCONUT OIL OIL
1.0000 "application " | TOPICAL_OIL | Status: DC | PRN
  Administered 2021-04-17: 1 via TOPICAL

## 2021-04-17 MED ORDER — ONDANSETRON HCL 4 MG/2ML IJ SOLN: 4.0000 mg | INTRAMUSCULAR | Status: DC | PRN

## 2021-04-17 MED ORDER — SOD CITRATE-CITRIC ACID 500-334 MG/5ML PO SOLN: 30.0000 mL | ORAL | Status: DC | PRN

## 2021-04-17 MED ORDER — SIMETHICONE 80 MG PO CHEW
80.0000 mg | CHEWABLE_TABLET | ORAL | Status: DC | PRN
  Administered 2021-04-19: 80 mg via ORAL
  Filled 2021-04-17 (×2): qty 1

## 2021-04-17 MED ORDER — LABETALOL HCL 200 MG PO TABS
200.0000 mg | ORAL_TABLET | Freq: Two times a day (BID) | ORAL | Status: DC
  Administered 2021-04-17: 200 mg via ORAL
  Filled 2021-04-17: qty 1

## 2021-04-17 MED ORDER — MISOPROSTOL 50MCG HALF TABLET: 50.0000 ug | ORAL_TABLET | ORAL | Status: DC

## 2021-04-17 MED ORDER — ONDANSETRON HCL 4 MG PO TABS: 4.0000 mg | ORAL_TABLET | ORAL | Status: DC | PRN

## 2021-04-17 MED ORDER — OXYTOCIN BOLUS FROM INFUSION
333.0000 mL | Freq: Once | INTRAVENOUS | Status: AC
  Administered 2021-04-17: 333 mL via INTRAVENOUS

## 2021-04-17 MED ORDER — PENICILLIN G POT IN DEXTROSE 60000 UNIT/ML IV SOLN
3.0000 10*6.[IU] | INTRAVENOUS | Status: DC
  Administered 2021-04-17: 3 10*6.[IU] via INTRAVENOUS
  Filled 2021-04-17: qty 50

## 2021-04-17 MED ORDER — WITCH HAZEL-GLYCERIN EX PADS: 1.0000 "application " | MEDICATED_PAD | CUTANEOUS | Status: DC | PRN

## 2021-04-17 MED ORDER — TETANUS-DIPHTH-ACELL PERTUSSIS 5-2.5-18.5 LF-MCG/0.5 IM SUSY: 0.5000 mL | PREFILLED_SYRINGE | Freq: Once | INTRAMUSCULAR | Status: DC

## 2021-04-17 MED ORDER — ONDANSETRON HCL 4 MG/2ML IJ SOLN: 4.0000 mg | Freq: Four times a day (QID) | INTRAMUSCULAR | Status: DC | PRN

## 2021-04-17 MED ORDER — OXYTOCIN-SODIUM CHLORIDE 30-0.9 UT/500ML-% IV SOLN
2.5000 [IU]/h | INTRAVENOUS | Status: DC
  Administered 2021-04-17: 2.5 [IU]/h via INTRAVENOUS
  Filled 2021-04-17: qty 500

## 2021-04-17 MED ORDER — IBUPROFEN 600 MG PO TABS
600.0000 mg | ORAL_TABLET | Freq: Four times a day (QID) | ORAL | Status: DC
  Administered 2021-04-17 – 2021-04-19 (×7): 600 mg via ORAL
  Filled 2021-04-17 (×7): qty 1

## 2021-04-17 MED ORDER — ACETAMINOPHEN 325 MG PO TABS
650.0000 mg | ORAL_TABLET | ORAL | Status: DC | PRN
  Administered 2021-04-17: 650 mg via ORAL
  Filled 2021-04-17: qty 2

## 2021-04-17 MED ORDER — LIDOCAINE HCL (PF) 1 % IJ SOLN: 30.0000 mL | INTRAMUSCULAR | Status: DC | PRN

## 2021-04-17 MED ORDER — FENTANYL CITRATE (PF) 100 MCG/2ML IJ SOLN: 100.0000 ug | INTRAMUSCULAR | Status: DC | PRN

## 2021-04-17 MED ORDER — OXYCODONE-ACETAMINOPHEN 5-325 MG PO TABS: 1.0000 | ORAL_TABLET | ORAL | Status: DC | PRN

## 2021-04-17 NOTE — Discharge Summary (Signed)
? ?  Postpartum Discharge Summary ? ?Date of Service updated ? ?   ?Patient Name: Colleen Stone ?DOB: 1995-10-19 ?MRN: [id number] ? ?Date of admission: 04/17/2021 ?Delivery date:04/17/2021  ?Delivering provider: Renard Matter  ?Date of discharge: 04/19/2021 ? ?Admitting diagnosis: Chronic hypertension affecting pregnancy [O10.919] ?Intrauterine pregnancy: [redacted]w[redacted]d    ?Secondary diagnosis:  Principal Problem: ?  Vaginal delivery ?Active Problems: ?  Bipolar 1 disorder ([place] ?  Chronic hypertension complicating or reason for care during pregnancy, second trimester ?  History of seizure disorder ?  Supervision of high risk pregnancy, antepartum ?  Group B streptococcal bacteriuria ?  Alpha thalassemia silent carrier ?  Chronic hypertension affecting pregnancy ? ?Additional problems: None    ?Discharge diagnosis: Term Pregnancy Delivered and CHTN                                              ?Post partum procedures: None ?Augmentation: AROM and Pitocin ?Complications: None ? ?Hospital course: Induction of Labor With Vaginal Delivery   ?26y.o. yo G3P1011 at 365w0das admitted to the hospital 04/17/2021 for induction of labor.  Indication for induction:  cHTN .  Patient had an uncomplicated labor course as follows: ?Membrane Rupture Time/Date: 6:39 PM ,04/17/2021   ?Delivery Method:Vaginal, Spontaneous  ?Episiotomy: None  ?Lacerations:  None  ?Details of delivery can be found in separate delivery note.  She was switched from Labetalol to Procardia post partum. She was also started on 5 day course of Lasix. She will monitor her BP at home and discussed s/sx of severe pre-eclampsia. She has a history of a seizure disorder, a referral to neurology was placed at discharge. Patient had a routine postpartum course. Patient is discharged home 04/19/21. ? ?Newborn Data: ?Birth date:04/17/2021  ?Birth time:7:48 PM  ?Gender:Female  ?Living status:Living  ?Apgars:8 ,9  ?Weight:3020 g  ? ?Magnesium Sulfate received: No ?BMZ received:  No ?Rhophylac:N/A ?MMR:N/A ?T-DaP: declined ?Flu: No ?Transfusion:No ? ?Physical exam  ?Vitals:  ? 04/18/21 1057 04/18/21 2130 04/19/21 0435 04/19/21 0500  ?BP: 119/61 137/89 (!) 134/99 118/85  ?Pulse: 95 78 94 83  ?Resp: _0 ?Temp: 98.4 ?F (36.9 ?C) 98.2 ?F (36.8 ?C) 98.3 ?F (36.8 ?C)   ?TempSrc: Oral Oral Oral   ?SpO2: 100% 100% 100%   ?Weight:      ?Height:      ? ?General: alert, cooperative, and no distress ?Lochia: appropriate ?Uterine Fundus: firm ?Incision: N/A ?DVT Evaluation: 1+ pitting edema to mid shin bilaterally  ?Labs: ?Lab Results  ?Component Value Date  ? WBC 9.9 04/17/2021  ? HGB 9.3 (L) 04/17/2021  ? HCT 30.0 (L) 04/17/2021  ? MCV 72.8 (L) 04/17/2021  ? PLT 341 04/17/2021  ? ? ?  Latest Ref Rng & Units 04/17/2021  ? 11:42 AM  ?CMP  ?Glucose 70 - 99 mg/dL 105    ?BUN 6 - 20 mg/dL <5    ?Creatinine 0.44 - 1.00 mg/dL 0.70    ?Sodium 135 - 145 mmol/L 136    ?Potassium 3.5 - 5.1 mmol/L 3.6    ?Chloride 98 - 111 mmol/L 106    ?CO2 22 - 32 mmol/L 20    ?Calcium 8.9 - 10.3 mg/dL 9.1    ?Total Protein 6.5 - 8.1 g/dL 6.3    ?Total Bilirubin 0.3 - 1.2 mg/dL 0.5    ?Alkaline Phos  38 - 126 U/L 171    ?AST 15 - 41 U/L 18    ?ALT 0 - 44 U/L 12    ? ?Edinburgh Score: ? ?  04/18/2021  ?  5:00 PM  ?Edinburgh Postnatal Depression Scale Screening Tool  ?I have been able to laugh and see the funny side of things. 1  ?I have looked forward with enjoyment to things. 1  ?I have blamed myself unnecessarily when things went wrong. 2  ?I have been anxious or worried for no good reason. 3  ?I have felt scared or panicky for no good reason. 2  ?Things have been getting on top of me. 2  ?I have been so unhappy that I have had difficulty sleeping. 3  ?I have felt sad or miserable. 2  ?I have been so unhappy that I have been crying. 2  ?The thought of harming myself has occurred to me. 0  ?Edinburgh Postnatal Depression Scale Total 18  ? ? ? ?After visit meds:  ?Allergies as of 04/19/2021   ? ?   Reactions  ? Red Dye Nausea And  Vomiting, Other (See Comments)  ? Her body rejects it  ? Latex Hives, Swelling  ? ?  ? ?  ?Medication List  ?  ? ?STOP taking these medications   ? ?albuterol (2.5 MG/3ML) 0.083% nebulizer solution ?Commonly known as: PROVENTIL ?  ?albuterol 108 (90 Base) MCG/ACT inhaler ?Commonly known as: VENTOLIN HFA ?  ?aspirin EC 81 MG tablet ?  ?butalbital-acetaminophen-caffeine 50-325-40 MG tablet ?Commonly known as: FIORICET ?  ?clotrimazole 1 % cream ?Commonly known as: LOTRIMIN ?  ?cyclobenzaprine 5 MG tablet ?Commonly known as: FLEXERIL ?  ?famotidine 20 MG tablet ?Commonly known as: Pepcid ?  ?labetalol 200 MG tablet ?Commonly known as: NORMODYNE ?  ?loratadine 10 MG tablet ?Commonly known as: CLARITIN ?  ?nystatin powder ?Commonly known as: MYCOSTATIN/NYSTOP ?  ?ondansetron 8 MG disintegrating tablet ?Commonly known as: Zofran ODT ?  ?pantoprazole 40 MG tablet ?Commonly known as: Protonix ?  ?promethazine 12.5 MG tablet ?Commonly known as: PHENERGAN ?  ?terconazole 0.4 % vaginal cream ?Commonly known as: TERAZOL 7 ?  ? ?  ? ?TAKE these medications   ? ?Acetaminophen Extra Strength 500 MG tablet ?Generic drug: acetaminophen ?Take 2 tablets (1,000 mg total) by mouth every 8 (eight) hours as needed (pain). ?  ?furosemide 20 MG tablet ?Commonly known as: LASIX ?Take 1 tablet (20 mg total) by mouth daily for 3 days. ?  ?ibuprofen 600 MG tablet ?Commonly known as: ADVIL ?Take 1 tablet (600 mg total) by mouth every 6 (six) hours as needed (pain). ?  ?NIFEdipine 30 MG 24 hr tablet ?Commonly known as: ADALAT CC ?Take 1 tablet (30 mg total) by mouth daily. ?Start taking on: April 20, 2021 ?  ?polyethylene glycol powder 17 GM/SCOOP powder ?Commonly known as: GLYCOLAX/MIRALAX ?Take 17 g by mouth daily. ?  ?Vitafol Gummies 3.33-0.333-34.8 MG Chew ?Chew 3 tablets by mouth daily. ?  ? ?  ? ? ? ?Discharge home in stable condition ?Infant Feeding: Breast ?Infant Disposition:home with mother ?Discharge instruction: per After Visit Summary  and Postpartum booklet. ?Activity: Advance as tolerated. Pelvic rest for 6 weeks.  ?Diet: routine diet ?Future Appointments: ?Future Appointments  ?Date Time Provider [place]  ?05/16/2021  3:50 PM Luvenia Redden, PA-C [facility] None  ? ?Follow up Visit: ? Follow-up Information   ? ? [facility] Follow up.   ?Why: Please look for your appt  in 1 week and then 4-6 weeks ?Contact information: ?[address] ?[place] [address] ?[phone] ? ?  ?  ? ? [facility] Follow up.   ?Why: Referral placed to their office. You can call if you haven't heard anything in the next few weeks. ?Contact information: ?[address], [address] ?[place] ?[phone] ? ?  ?  ? ?  ?  ? ?  ? ?Message sent to [facility] by Dr. Cy Blamer on 4/4 ? ? ?Please schedule this patient for a In person postpartum visit in 4 weeks with the following provider: Any provider. ?Additional Postpartum F/U: None   ?High risk pregnancy complicated by:  cHTN ?Delivery mode:  Vaginal, Spontaneous  ?Anticipated Birth Control:   unsure, considering Depo ? ? ?04/19/2021 ?Patriciaann Clan, DO ? ? ? ?

## 2021-04-17 NOTE — H&P (Addendum)
OBSTETRIC ADMISSION HISTORY AND PHYSICAL ? ?Colleen Stone is a 26 y.o. female G3P1011 with IUP at [redacted]w[redacted]d by early [place] ([redacted]w[redacted]d) presenting for IOL. She reports +FMs, No LOF, no VB, no blurry vision, headaches or peripheral edema, and RUQ pain.  She plans on breast feeding. She is still undecided on [facility] but considering interval tubal ligation. States that she would not like to have anything in her body. She has tried Insurance risk surveyor and Mirena in the past.  ? ?She received her prenatal care at [facility]  ? ?Dating: By [place] ([redacted]w[redacted]d) --->  Estimated Date of Delivery: 05/01/21 ? ?Sono:   ? ?@[redacted]w[redacted]d , CWD, normal anatomy, cephalic presentation,  ?@ [redacted]w[redacted]d, posterior placental lie, 2314g, 31% EFW ? ? 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 ?Past Medical History: ?Past Medical History:  ?Diagnosis Date  ? Anxiety   ? Asthma   ? Chlamydia   ? Gonorrhea   ? Hypertension   ? takes labetalol  ? MRSA infection   ? 8th grade  ? Other acne 06/30/2008  ? Overview:  Acne  ? PCOS (polycystic ovarian syndrome)   ? Seizures (HCC)   ? Last seizure in this pregnancy not  sure when  ? Trichomonas infection   ? ? ?Past Surgical History: ?Past Surgical History:  ?Procedure Laterality Date  ? ADENOIDECTOMY    ? NASAL SEPTOPLASTY W/ TURBINOPLASTY    ? ROOT CANAL    ? TONSILLECTOMY    ? WISDOM TOOTH EXTRACTION    ? ? ?Obstetrical History: ?OB History   ? ? Gravida  ?3  ? Para  ?1  ? Term  ?1  ? Preterm  ?   ? AB  ?1  ? Living  ?1  ?  ? ? SAB  ?1  ? IAB  ?   ? Ectopic  ?   ? Multiple  ?0  ? Live Births  ?1  ?   ?  ?  ? ? ?Social History ?Social History  ? ?Socioeconomic History  ? Marital status: Single  ?  Spouse name: Not on file  ? Number of children: Not on file  ? Years of education: Not on file  ? Highest education level: Not on file  ?Occupational History  ? Not on file  ?Tobacco Use  ? Smoking status: Former  ?  Types: Cigarettes  ?  Quit  date: 10/03/2017  ?  Years since quitting: 3.5  ? Smokeless tobacco: Never  ?Vaping Use  ? Vaping Use: Former  ? Substances: Nicotine, Flavoring  ?Substance and Sexual Activity  ? Alcohol use: Not Currently  ?  Comment: not since pregnancy  ? Drug use: Not Currently  ?  Types: Marijuana  ?  Comment: Last marijuana 09/08/20  ? Sexual activity: Yes  ?  Partners: Male  ?  Birth control/protection: None  ?Other Topics Concern  ? Not on file  ?Social History Narrative  ? Not on file  ? ?Social Determinants of Health  ? ?Financial Resource Strain: Not on file  ?Food Insecurity: Not on file  ?Transportation Needs: Not on file  ?Physical Activity: Not on file  ?Stress: Not on file  ?Social Connections: Not on file  ? ? ?Family History: ?Family History  ?Problem Relation Age of Onset  ? Asthma Mother   ? Heart disease Mother   ?     murmur  ? Hypertension Mother   ?  Heart disease Father   ? ? ?Allergies: ?Allergies  ?Allergen Reactions  ? Red Dye Nausea And Vomiting and Other (See Comments)  ?  Her body rejects it  ? Latex Hives and Swelling  ? ? ?Medications Prior to Admission  ?Medication Sig Dispense Refill Last Dose  ? aspirin EC 81 MG tablet Take 1 tablet (81 mg total) by mouth daily. Take after 12 weeks for prevention of preeclampsia later in pregnancy 300 tablet 2 Past Week  ? butalbital-acetaminophen-caffeine (FIORICET) 50-325-40 MG tablet Take 1-2 tablets by mouth every 6 (six) hours as needed for headache. 20 tablet 0 Past Month  ? cyclobenzaprine (FLEXERIL) 5 MG tablet Take 1-2 tablets (5-10 mg total) by mouth 3 (three) times daily as needed for muscle spasms. 30 tablet 0 Past Week  ? labetalol (NORMODYNE) 200 MG tablet Take 1 tablet (200 mg total) by mouth 2 (two) times daily. 60 tablet 3 04/17/2021  ? pantoprazole (PROTONIX) 40 MG tablet Take 1 tablet (40 mg total) by mouth daily. 30 tablet 5 04/17/2021  ? Prenatal Vit-Fe Phos-FA-Omega (VITAFOL GUMMIES) 3.33-0.333-34.8 MG CHEW Chew 3 tablets by mouth daily. 90 tablet  11 04/16/2021  ? albuterol (PROVENTIL) (2.5 MG/3ML) 0.083% nebulizer solution Take 3 mLs (2.5 mg total) by nebulization every 6 (six) hours as needed for wheezing or shortness of breath. (Patient not taking: Reported on 02/12/2021) 150 mL 1   ? albuterol (VENTOLIN HFA) 108 (90 Base) MCG/ACT inhaler Inhale 2 puffs into the lungs every 6 (six) hours as needed for wheezing or shortness of breath. (Patient not taking: Reported on 02/12/2021) 18 g 4   ? clotrimazole (LOTRIMIN) 1 % cream Apply to affected area 2 times daily (Patient not taking: Reported on 02/12/2021) 45 g 0   ? famotidine (PEPCID) 20 MG tablet Take 1 tablet (20 mg total) by mouth 2 (two) times daily as needed for heartburn or indigestion. (Patient not taking: Reported on 02/16/2021) 30 tablet 2   ? Misc. Devices (GOJJI WEIGHT SCALE) MISC 1 Device by Does not apply route once a week. (Patient not taking: Reported on 02/12/2021) 1 each 0   ? nystatin (MYCOSTATIN/NYSTOP) powder Apply 1 application topically 3 (three) times daily. (Patient not taking: Reported on 04/06/2021) 15 g 0   ? ondansetron (ZOFRAN ODT) 8 MG disintegrating tablet Take 1 tablet (8 mg total) by mouth every 8 (eight) hours as needed for nausea or vomiting. (Patient not taking: Reported on 02/12/2021) 60 tablet 1   ? promethazine (PHENERGAN) 12.5 MG tablet Take 1-2 tablets (12.5-25 mg total) by mouth every 6 (six) hours as needed for nausea or vomiting. (Patient not taking: Reported on 02/27/2021) 30 tablet 3   ? terconazole (TERAZOL 7) 0.4 % vaginal cream Place 1 applicator vaginally at bedtime. (Patient not taking: Reported on 04/06/2021) 45 g 0   ? ? ? ?Review of Systems  ? ?All systems reviewed and negative except as stated in HPI ? ?Blood pressure (!) 131/95, pulse 92, temperature 98.1 ?F (36.7 ?C), temperature source Oral, resp. rate 16, height 5' 4.5" (1.638 m), weight 131.3 kg, last menstrual period 07/25/2020, SpO2 100 %. ?General appearance: alert, cooperative, and appears stated  age ?Lungs: clear to auscultation bilaterally ?Heart: regular rate and rhythm ?Abdomen: soft, non-tender; bowel sounds normal ?Extremities: Homans sign is negative, no sign of DVT ?Presentation: cephalic ?Fetal monitoringBaseline: 130 bpm, Variability: Good {> 6 bpm), Accelerations: Reactive, and Decelerations: Early ?Uterine activity: every 3-4 minutes ?Dilation: 3 ?Effacement (%): 70 ?Station: -2 ?Exam by::  H.Price, RN ? ? ?Prenatal labs: ?ABO, Rh: --/--/A POS (04/04 1142) ?Antibody: NEG (04/04 1142) ?Rubella: 2.60 (09/29 1530) ?RPR: Non Reactive (01/25 0940)  ?HBsAg: Negative (09/29 1530)  ?HIV: Non Reactive (01/25 0940)  ?GBS: positive    ?1 hr Glucola normal ?Genetic screening: silent carrier for alpha-thalassemia (aa/a-); no chromosomal abnormalities  ?Anatomy US normal ? ?Prenatal Transfer Tool  ?Maternal Diabetes: No ?Genetic Screening: Normal ?Maternal Ultrasounds/Referrals: Normal ?Fetal Ultrasounds or other Referrals:  None ?Maternal Substance Abuse:  No ?Significant Maternal Medications:  Protonix, Fioricet, Flexeril, Labetalol  ?Significant Maternal Lab Results: Group B Strep positive ? ?Results for orders placed or performed during the hospital encounter of 04/17/21 (from the past 24 hour(s))  ?CBC  ? Collection Time: 04/17/21 11:42 AM  ?Result Value Ref Range  ? WBC 9.9 4.0 - 10.5 K/uL  ? RBC 4.12 3.87 - 5.11 MIL/uL  ? Hemoglobin 9.3 (L) 12.0 - 15.0 g/dL  ? HCT 30.0 (L) 36.0 - 46.0 %  ? MCV 72.8 (L) 80.0 - 100.0 fL  ? MCH 22.6 (L) 26.0 - 34.0 pg  ? MCHC 31.0 30.0 - 36.0 g/dL  ? RDW 16.8 (H) 11.5 - 15.5 %  ? Platelets 341 150 - 400 K/uL  ? nRBC 0.0 0.0 - 0.2 %  ?Comprehensive metabolic panel  ? Collection Time: 04/17/21 11:42 AM  ?Result Value Ref Range  ? Sodium 136 135 - 145 mmol/L  ? Potassium 3.6 3.5 - 5.1 mmol/L  ? Chloride 106 98 - 111 mmol/L  ? CO2 20 (L) 22 - 32 mmol/L  ? Glucose, Bld 105 (H) 70 - 99 mg/dL  ? BUN <5 (L) 6 - 20 mg/dL  ? Creatinine, Ser 0.70 0.44 - 1.00 mg/dL  ? Calcium 9.1 8.9 -  10.3 mg/dL  ? Total Protein 6.3 (L) 6.5 - 8.1 g/dL  ? Albumin 2.6 (L) 3.5 - 5.0 g/dL  ? AST 18 15 - 41 U/L  ? ALT 12 0 - 44 U/L  ? Alkaline Phosphatase 171 (H) 38 - 126 U/L  ? Total Bilirubin 0.5 0.3 - 1.2 mg/d

## 2021-04-17 NOTE — Lactation Note (Signed)
This note was copied from a baby's chart. ?Lactation Consultation Note ? ?Patient Name: Colleen Stone ?Today's Date: 04/17/2021 ?Reason for consult: L&D Initial assessment ?Age:27 hours ?Infant  did not latch in L&D, only held nipple in mouth at this time. ?Mom is experienced at BF see maternal data.  ?LC encouraged mom to do lots of skin to skin with infant. ?Mom will attempt latch infant again if she starts cuing to feed. ?Mom knows she can call RN/LC for latch assistance on MBU. ?Mom will BF infant according primal cues. ?LC congratulated parents on the birth of their daughter.  ?Maternal Data ?Does the patient have breastfeeding experience prior to this delivery?: Yes ?How long did the patient breastfeed?: Per mom, she BF 1st child for 2 years, her daughter is past 2 1/2 years currently. ? ?Feeding ?Mother's Current Feeding Choice: Breast Milk ? ?LATCH Score ?Latch: Too sleepy or reluctant, no latch achieved, no sucking elicited. ? ?Audible Swallowing: None ? ?Type of Nipple: Everted at rest and after stimulation ? ?Comfort (Breast/Nipple): Soft / non-tender ? ?Hold (Positioning): Assistance needed to correctly position infant at breast and maintain latch. ? ?LATCH Score: 5 ? ? ?Lactation Tools Discussed/Used ?  ? ?Interventions ?Interventions: Assisted with latch;Skin to skin;Breast compression;Position options;Education ? ?Discharge ?  ? ?Consult Status ?Consult Status: Follow-up from L&D ?Date: 04/18/21 ?Follow-up type: In-patient ? ? ? ?Danelle Earthly ?04/17/2021, 8:29 PM ? ? ? ?

## 2021-04-18 LAB — GC/CHLAMYDIA PROBE AMP (~~LOC~~) NOT AT ARMC
Chlamydia: NEGATIVE
Comment: NEGATIVE
Comment: NORMAL
Neisseria Gonorrhea: NEGATIVE

## 2021-04-18 LAB — RPR: RPR Ser Ql: NONREACTIVE

## 2021-04-18 MED ORDER — FUROSEMIDE 20 MG PO TABS
20.0000 mg | ORAL_TABLET | Freq: Every day | ORAL | Status: DC
Start: 2021-04-18 — End: 2021-04-19
  Administered 2021-04-18 – 2021-04-19 (×2): 20 mg via ORAL
  Filled 2021-04-18 (×2): qty 1

## 2021-04-18 NOTE — Clinical Social Work Maternal (Signed)
?CLINICAL SOCIAL WORK MATERNAL/CHILD NOTE ? ?Patient Details  ?Name: Colleen Stone ?MRN: [id number] ?Date of Birth: 02/25/1995 ? ?Date:  04/18/2021 ? ?Clinical Social Worker Initiating Note:  Product manager Date/Time: Initiated:  04/18/21/1432    ? ?Child's Name:  Colleen Stone  ? ?Biological Parents:  Mother, Father Colleen Stone 07/14/1996)  ? ?Need for Interpreter:  None  ? ?Reason for Referral:  Behavioral Health Concerns, Current Substance Use/Substance Use During Pregnancy    ? ?Address:  [address] ?[place] [address]  ?  ?Phone number:  [phone] (home)    ? ?Additional phone number:  ? ?Household Members/Support Persons (HM/SP):   Household Member/Support Person 1 ? ? ?HM/SP Name Relationship DOB or Age  ?HM/SP -1 Colleen Stone daughter 07/05/18  ?HM/SP -2        ?HM/SP -3        ?HM/SP -4        ?HM/SP -5        ?HM/SP -6        ?HM/SP -7        ?HM/SP -8        ? ? ?Natural Supports (not living in the home):  Immediate Family, Extended Family, Spouse/significant other, Parent (MOB reported that FOB and FOB's family will also provide supports.)  ? ?Professional Supports: Therapist (MOB reported she is an established patient with [facility].)  ? ?Employment: Full-time  ? 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 ?Education:  Some College  ? ?Homebound arranged:   ? ?Financial Resources:  Medicaid  ? ?Other Resources:  Sales executive  , WIC  ? ?Cultural/Religious Considerations Which May Impact Care:  None reported ? ?Strengths:  Ability to meet basic needs  , Pediatrician chosen, Home prepared for child    ? ?Psychotropic Medications:        ? ?Pediatrician:    [place] area ? ?Pediatrician List:  ? ?[place] Glass blower/designer)  ?[place]    ?[facility]    ?[facility]    ?[facility]    ?[facility]    ? ? ?Pediatrician Fax Number:   ? ?Risk Factors/Current Problems:  Substance Use  , Mental Health Concerns    ? ?Cognitive State:  Able to Concentrate  , Linear Thinking  , Alert   , Insightful    ? ?Mood/Affect:  Comfortable  , Interested  , Happy  , Relaxed    ? ?CSW Assessment: CSW met with MOB in room 511 to complete an assessment for SA hx and MH hx.  When CSW arrived, MOB was bonding with infant as evidence by breastfeeding infant and engaging in skin to skin.  CSW offered to return at a later time and MOB declined.  MOB was polite, forthcoming and receptive to meeting with CSW.  ? ?CSW asked about MOB's SA hx and MOB acknowledged the use of marijuana.  Per MOB she stopped using marijuana after her pregnancy confirmation. MOB denied the use of all other illicit substances and she declined resources for substance abuse.  CSW reviewed hospital SA policy and MOB was understanding.  MOB is aware that infant's UDS is negative and CSW will continue to monitor infant's CDS and will make a report to [facility] CPS if warranted.  ? ?MOB asked about MOB's MH hx and MOB acknowledged a hx of anxiety and bipolar disorder.  Per MOB she was dx around age 8. MOB communicated she is not currently taking any medications and she is  an established patient with [facility]. MOB shard feeling comfortable seeking help is additional help is needed. 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  CSW provided education regarding the baby blues period vs. perinatal mood disorders, discussed treatment and gave resources for mental health follow up if concerns arise.  CSW recommends self-evaluation during the postpartum time period using the New Mom Checklist from Postpartum Progress and encouraged MOB to contact a medical professional if symptoms are noted at any time.  MOB presented with insight and awareness and shared she has a good support team that will assist her if needed. CSW assessed for SI, HI, and DV; MOB denied them all.   ? ?CSW provided review of Sudden Infant Death Syndrome (SIDS) precautions.   ? ?CSW identifies  no further need for intervention and no barriers to discharge at this time.  ? ?CSW Plan/Description:  Psychosocial Support and Ongoing Assessment of Needs, Sudden Infant Death Syndrome (SIDS) Education, Perinatal Mood and Anxiety Disorder (PMADs) Education, Other Patient/Family Education, Hospital Drug Screen Policy Information, Other Information/Referral to [facility], CSW Will Continue to Monitor Umbilical Cord Tissue Drug Screen Results and Make Report if Warranted  ? ?Colleen Stone, MSW, LCSW ?Clinical Social Work ?[phone] ? ?Colleen Willenbring D BOYD-GILYARD, LCSW ?04/18/2021, 2:39 PM ? ?

## 2021-04-18 NOTE — Progress Notes (Signed)
Post Partum Day 1 ?Subjective: ?Doing well. No acute events overnight. Pain is controlled and bleeding is appropriate. She is eating, drinking, voiding, and ambulating without issue. She is breast feeding which is going well. She has no other concerns at this time. ? ?Objective: ?Blood pressure 105/62, pulse 85, temperature 98.3 ?F (36.8 ?C), temperature source Oral, resp. rate 18, height 5' 4.5" (1.638 m), weight 131.3 kg, last menstrual period 07/25/2020, SpO2 100 %, unknown if currently breastfeeding. ? ?Physical Exam:  ?General: alert, cooperative, and no distress ?Lochia: appropriate ?Uterine Fundus: firm and below umbilicus  ?DVT Evaluation: 1+ edema in lower extremities bilaterally, no calf tenderness to palpation  ? ?Recent Labs  ?  04/17/21 ?1142  ?HGB 9.3*  ?HCT 30.0*  ? ? ?Assessment/Plan: ?Colleen Stone is a 26 y.o. [id number] on PPD# 1 s/p SVD. ? ?Progressing well. Meeting postpartum milestones. VSS. Continue routine postpartum care. ? ?#cHTN: ?- BP low normal post delivery ?- Antihypertensives not indicated at this time ?- Will start Lasix 20 mg daily to complete a 5 day course ?- Will continue to monitor while inpatient and adjust regimen as needed  ? ?#Hx of seizures: ?- Will assist with outpatient Neurology follow up prior to discharge  ? ?Feeding: Breast  ?Contraception: Unsure, counseled  ? ?Dispo: Plan for discharge on PPD#2.  ? ? LOS: 1 day  ? ?Worthy Rancher, MD  ?04/18/2021, 9:39 AM  ? ? ?

## 2021-04-19 ENCOUNTER — Other Ambulatory Visit (HOSPITAL_COMMUNITY): Payer: Self-pay

## 2021-04-19 ENCOUNTER — Telehealth: Payer: Self-pay | Admitting: Emergency Medicine

## 2021-04-19 MED ORDER — POLYETHYLENE GLYCOL 3350 17 GM/SCOOP PO POWD
17.0000 g | Freq: Every day | ORAL | 0 refills | Status: DC
Start: 2021-04-19 — End: 2021-09-19
  Filled 2021-04-19: qty 476, 28d supply, fill #0

## 2021-04-19 MED ORDER — NIFEDIPINE ER 30 MG PO TB24
30.0000 mg | ORAL_TABLET | Freq: Every day | ORAL | 1 refills | Status: DC
  Filled 2021-04-19: qty 30, 30d supply, fill #0

## 2021-04-19 MED ORDER — FAMOTIDINE 40 MG/5ML PO SUSR
20.0000 mg | Freq: Every day | ORAL | Status: DC
  Administered 2021-04-19: 20 mg via ORAL
  Filled 2021-04-19: qty 2.5

## 2021-04-19 MED ORDER — ACETAMINOPHEN 500 MG PO TABS
1000.0000 mg | ORAL_TABLET | Freq: Three times a day (TID) | ORAL | 0 refills | Status: AC | PRN
  Filled 2021-04-19: qty 60, 10d supply, fill #0

## 2021-04-19 MED ORDER — FUROSEMIDE 20 MG PO TABS
20.0000 mg | ORAL_TABLET | Freq: Every day | ORAL | 0 refills | Status: DC
  Filled 2021-04-19: qty 3, 3d supply, fill #0

## 2021-04-19 MED ORDER — NIFEDIPINE ER OSMOTIC RELEASE 30 MG PO TB24
30.0000 mg | ORAL_TABLET | Freq: Every day | ORAL | Status: DC
  Administered 2021-04-19: 30 mg via ORAL
  Filled 2021-04-19: qty 1

## 2021-04-19 MED ORDER — IBUPROFEN 600 MG PO TABS
600.0000 mg | ORAL_TABLET | Freq: Four times a day (QID) | ORAL | 0 refills | Status: DC | PRN
  Filled 2021-04-19: qty 40, 10d supply, fill #0

## 2021-04-19 NOTE — Social Work (Signed)
CSW received consult due to score 18 on Edinburgh Depression Screen.   ? ?On 4/5, CSW [employer] met with MOB and provided education regarding Baby Blues vs PMADs and provided MOB with resources for mental health follow up.  CSW encouraged MOB to evaluate her mental health throughout the postpartum period with the use of the New Mom Checklist developed by Postpartum Progress as well as the [place] Postnatal Depression Scale and notify a medical professional if symptoms arise.    ? ?Colleen Stone, MSW, LCSW ?[facility] [place]  ?Clinical Social Worker  ?[phone] ?04/19/2021  1:45 PM  ?

## 2021-04-19 NOTE — Telephone Encounter (Signed)
Received call to office from nurse via baby scripts. Baby Scripts alert BP that was entered on pt of 137/91. ? ?TC to patient to f/u on BP alert from babyscripts. Pt states that she is still admitted in L&D and receiving postpartum care. Her BP medication is being administered and she has no concerns at this time.  ?

## 2021-04-23 ENCOUNTER — Encounter: Payer: Self-pay | Admitting: Neurology

## 2021-04-26 ENCOUNTER — Telehealth: Payer: Self-pay | Admitting: *Deleted
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
## 2021-05-09 ENCOUNTER — Encounter: Payer: Self-pay | Admitting: Obstetrics and Gynecology

## 2021-05-16 ENCOUNTER — Encounter: Payer: Self-pay | Admitting: Medical

## 2021-05-16 ENCOUNTER — Encounter: Payer: Medicaid Other | Admitting: Licensed Clinical Social Worker

## 2021-05-16 ENCOUNTER — Ambulatory Visit (INDEPENDENT_AMBULATORY_CARE_PROVIDER_SITE_OTHER): Payer: Medicaid Other | Admitting: Medical

## 2021-05-16 VITALS — BP 124/75 | HR 98 | Ht 64.0 in | Wt 270.0 lb

## 2021-05-16 DIAGNOSIS — Z1331 Encounter for screening for depression: Secondary | ICD-10-CM | POA: Diagnosis not present

## 2021-05-16 DIAGNOSIS — Z3009 Encounter for other general counseling and advice on contraception: Secondary | ICD-10-CM

## 2021-05-16 DIAGNOSIS — I10 Essential (primary) hypertension: Secondary | ICD-10-CM

## 2021-05-16 LAB — POCT URINE PREGNANCY: Preg Test, Ur: NEGATIVE

## 2021-05-16 MED ORDER — SLYND 4 MG PO TABS: 1.0000 | ORAL_TABLET | Freq: Every day | ORAL | 12 refills | Status: DC

## 2021-05-16 NOTE — Progress Notes (Signed)
amb ? ? ?Post Partum Visit Note ? ?Colleen Stone is a 26 y.o. G19P2012 female who presents for a postpartum visit. She is 4 weeks postpartum following a normal spontaneous vaginal delivery.  I have fully reviewed the prenatal and intrapartum course. The delivery was at 38.0 gestational weeks.  Anesthesia: IV sedation. Postpartum course has been unremarkable. Baby is doing well. Baby is feeding by breast. Bleeding no bleeding. Bowel function is normal. Bladder function is normal. Patient is not sexually active. Contraception method is none. Postpartum depression screening: positive=20 ? ? ?The pregnancy intention screening data noted above was reviewed. Potential methods of contraception were discussed. The patient elected to proceed with No data recorded. ? ? Edinburgh Postnatal Depression Scale - 05/16/21 1632   ? ?  ? Edinburgh Postnatal Depression Scale:  In the Past 7 Days  ? I have been able to laugh and see the funny side of things. 1   ? I have looked forward with enjoyment to things. 1   ? I have blamed myself unnecessarily when things went wrong. 3   ? I have been anxious or worried for no good reason. 3   ? I have felt scared or panicky for no good reason. 3   ? Things have been getting on top of me. 2   ? I have been so unhappy that I have had difficulty sleeping. 3   ? I have felt sad or miserable. 2   ? I have been so unhappy that I have been crying. 2   ? The thought of harming myself has occurred to me. 0   ? Edinburgh Postnatal Depression Scale Total 20   ? ?  ?  ? ?  ? ? ?Health Maintenance Due  ?Topic Date Due  ? COVID-19 Vaccine (1) Never done  ? ? ?The following portions of the patient's history were reviewed and updated as appropriate: allergies, current medications, past family history, past medical history, past social history, past surgical history, and problem list. ? ?Review of Systems ?Pertinent items are noted in HPI. ? ?Objective:  ?BP 124/75   Pulse 98   Ht 5\' 4"  (1.626 m)   Wt  270 lb (122.5 kg)   LMP 07/25/2020   Breastfeeding Yes   BMI 46.35 kg/m?   ? ?General:  alert and cooperative  ? Breasts:  not indicated  ?Lungs: Normal effort  ?Heart:  Normal rate  ?Abdomen: Soft, non-tender    ?Wound N/A  ?GU exam:  normal  ?     ?Assessment:  ? ?Normal postpartum exam.  ?CHTN, no meds  ?Depression  ? ?Plan:  ? ?Essential components of care per ACOG recommendations: ? ?1.  Mood and well being: Patient with positive depression screening today. Reviewed local resources for support. Referred to [facility].  ?- Patient tobacco use? No.   ?- hx of drug use? No.   ? ?2. Infant care and feeding:  ?-Patient currently breastmilk feeding? Yes. Reviewed importance of draining breast regularly to support lactation.  ?- Discussed at length as patient recently formula fed baby and missed opportunities to pump ?-Social determinants of health (SDOH) reviewed in EPIC. No concerns ? ?3. Sexuality, contraception and birth spacing ?- Patient does not want a pregnancy in the next year.  Desired family size is 3 children.  ?- Reviewed reproductive life planning. Reviewed contraceptive methods based on pt preferences and effectiveness.  Patient desired Oral Contraceptive today.   ?- Discussed birth spacing of 18 months ? ?4.  Sleep and fatigue ?-Encouraged family/partner/community support of 4 hrs of uninterrupted sleep to help with mood and fatigue ? ?5. Physical Recovery  ?- Discussed patients delivery and complications. She describes her labor as good. ?- Patient had a Vaginal, no problems at delivery. Patient had  no  laceration. Perineal healing reviewed. Patient expressed understanding ?- Patient has urinary incontinence? No. ?- Patient is safe to resume physical and sexual activity ? ?6.  Health Maintenance ?- HM due items addressed Yes ?- Last pap smear  ?Diagnosis  ?Date Value Ref Range Status  ?10/12/2020     ? - Negative for intraepithelial lesion or malignancy (NILM)  ? Pap smear not done at today's visit.   ?-Breast Cancer screening indicated? No.  ? ?7. Chronic Disease/Pregnancy Condition follow up: Hypertension ?- PCP follow up - referral sent  ? ?Kerry Hough, PA-C ?[facility]  ?

## 2021-05-17 ENCOUNTER — Telehealth: Payer: Self-pay | Admitting: Licensed Clinical Social Worker

## 2021-05-17 ENCOUNTER — Encounter: Payer: Medicaid Other | Admitting: Licensed Clinical Social Worker

## 2021-05-17 NOTE — Telephone Encounter (Signed)
Called pt regarding scheduled mychart visit. Left message requesting callback  

## 2021-05-18 ENCOUNTER — Ambulatory Visit (INDEPENDENT_AMBULATORY_CARE_PROVIDER_SITE_OTHER): Payer: Medicaid Other | Admitting: Licensed Clinical Social Worker

## 2021-05-18 DIAGNOSIS — F53 Postpartum depression: Secondary | ICD-10-CM

## 2021-05-18 NOTE — BH Specialist Note (Signed)
Integrated Behavioral Health via Telemedicine Visit ? ?05/18/2021 ?Colleen Stone ?[id number] ? ?Number of Integrated Behavioral Health Clinician visits: 1 ?Session Start time: 1015am    ?Session End time: 1040am ?Total time in minutes: 25 min via mychart video  ? ?Referring Provider: Harlon Stone  ?Patient/Family location: Home  ?[facility] Provider location: [facility]  ?All persons participating in visit: Colleen Stone and Colleen Stone  ?Types of Service: Individual psychotherapy and Video visit ? ?I connected with Colleen Stone and/or Colleen Stone's n/a via  Telephone or Video Enabled Telemedicine Application  (Video is Caregility application) and verified that I am speaking with the correct person using two identifiers. Discussed confidentiality: Yes  ? ?I discussed the limitations of telemedicine and the availability of in person appointments.  Discussed there is a possibility of technology failure and discussed alternative modes of communication if that failure occurs. ? ?I discussed that engaging in this telemedicine visit, they consent to the provision of behavioral healthcare and the services will be billed under their insurance. ? ?Patient and/or legal guardian expressed understanding and consented to Telemedicine visit: Yes  ? ?Presenting Concerns: ?Patient and/or family reports the following symptoms/concerns: postpartum depression, mood swings, difficulty sleeping, anxious. Colleen Stone reports history of bipolar depression and anxiety ?Duration of problem: Mood swings one month ago, bipolar and anxiety over one year ; Severity of problem: mild ? ?Patient and/or Family's Strengths/Protective Factors: ?Concrete supports in place (healthy food, safe environments, etc.) ? ?Goals Addressed: ?Patient will: ? Reduce symptoms of: depression  ? Increase knowledge and/or ability of: coping skills  ? Demonstrate ability to: Increase healthy adjustment to current life circumstances ? ?Progress towards  Goals: ?Ongoing ? ?Interventions: ?Interventions utilized:  Motivational Interviewing and Supportive Counseling ?Standardized Assessments completed: Edinburgh Postnatal Depression ? ?Patient and/or Family Response: Colleen Stone reports feeling anxious and experiencing mood swings since delivery. Colleen Stone reports difficulty sleeping and limited support with parenting responsibilities.  ? ?Assessment: ?Patient currently experiencing postpartum depression .  ? ?Patient may benefit from outpatient behavioral health. ? ?Plan: ?Follow up with behavioral health clinician on : contact [facility] ?Behavioral recommendations: Prioritize rest, delegate task, self care and contact [facility] [address] for medication mgmt  ?Referral(s): Psychiatrist ? ?I discussed the assessment and treatment plan with the patient and/or parent/guardian. They were provided an opportunity to ask questions and all were answered. They agreed with the plan and demonstrated an understanding of the instructions. ?  ?They were advised to call back or seek an in-person evaluation if the symptoms worsen or if the condition fails to improve as anticipated. ? ?Colleen Saxon, Colleen ?

## 2021-06-13 ENCOUNTER — Ambulatory Visit: Payer: Medicaid Other | Admitting: Neurology

## 2021-07-21 ENCOUNTER — Emergency Department (HOSPITAL_COMMUNITY): Payer: Medicaid Other

## 2021-07-21 ENCOUNTER — Encounter: Payer: Self-pay | Admitting: Emergency Medicine

## 2021-07-21 ENCOUNTER — Ambulatory Visit (INDEPENDENT_AMBULATORY_CARE_PROVIDER_SITE_OTHER): Payer: Medicaid Other

## 2021-07-21 ENCOUNTER — Other Ambulatory Visit: Payer: Self-pay

## 2021-07-21 ENCOUNTER — Emergency Department (HOSPITAL_COMMUNITY)
Admission: EM | Admit: 2021-07-21 | Discharge: 2021-07-21 | Discharge status: Against Medical Advice | Payer: Medicaid Other | Attending: Emergency Medicine | Admitting: Emergency Medicine

## 2021-07-21 ENCOUNTER — Encounter (HOSPITAL_COMMUNITY): Payer: Self-pay | Admitting: Emergency Medicine

## 2021-07-21 ENCOUNTER — Ambulatory Visit
Admission: EM | Admit: 2021-07-21 | Discharge: 2021-07-21 | Disposition: A | Discharge status: Home / Self Care | Payer: Medicaid Other | Attending: Emergency Medicine | Admitting: Emergency Medicine

## 2021-07-21 DIAGNOSIS — Z5321 Procedure and treatment not carried out due to patient leaving prior to being seen by health care provider: Secondary | ICD-10-CM | POA: Insufficient documentation

## 2021-07-21 DIAGNOSIS — M25571 Pain in right ankle and joints of right foot: Secondary | ICD-10-CM

## 2021-07-21 DIAGNOSIS — T7411XA Adult physical abuse, confirmed, initial encounter: Secondary | ICD-10-CM

## 2021-07-21 DIAGNOSIS — M25561 Pain in right knee: Secondary | ICD-10-CM | POA: Diagnosis not present
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
## 2021-07-21 NOTE — ED Notes (Signed)
Patient states she is going to urgent care
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
## 2021-07-21 NOTE — Discharge Instructions (Addendum)
Take OTC Tylenol as needed for pain Follow-up with PCP/orthopedic Follow RICE instruction that is attached Return or go to ED if you develop any new or worsening of your symptoms

## 2021-09-13 ENCOUNTER — Other Ambulatory Visit (HOSPITAL_COMMUNITY)
Admission: RE | Admit: 2021-09-13 | Discharge: 2021-09-13 | Disposition: A | Discharge status: Home / Self Care | Payer: Medicaid Other | Source: Ambulatory Visit | Attending: Obstetrics and Gynecology | Admitting: Obstetrics and Gynecology

## 2021-09-13 ENCOUNTER — Ambulatory Visit (INDEPENDENT_AMBULATORY_CARE_PROVIDER_SITE_OTHER): Payer: Medicaid Other | Admitting: Emergency Medicine

## 2021-09-13 VITALS — BP 126/81 | HR 81 | Ht 64.0 in | Wt 266.0 lb

## 2021-09-13 DIAGNOSIS — N912 Amenorrhea, unspecified: Secondary | ICD-10-CM

## 2021-09-13 DIAGNOSIS — N898 Other specified noninflammatory disorders of vagina: Secondary | ICD-10-CM | POA: Insufficient documentation

## 2021-09-13 LAB — POCT URINE PREGNANCY: Preg Test, Ur: NEGATIVE

## 2021-09-13 NOTE — Progress Notes (Signed)
SUBJECTIVE:  26 y.o. female complains of white vaginal discharge for 3 day(s). Denies abnormal vaginal bleeding or significant pelvic pain or fever. No UTI symptoms. Denies history of known exposure to STD. Requests UPT for amenorrhea- Negative in office today. Desires to switch from OCP to patch. Instructed to schedule provider visit to discuss.  LMP 05/17/2021  OBJECTIVE:  She appears well, afebrile. Urine dipstick: not done.  ASSESSMENT:  Vaginal Discharge  Vaginal Odor   PLAN:  GC, chlamydia, trichomonas, BVAG, CVAG probe sent to lab. Treatment: To be determined once lab results are received ROV prn if symptoms persist or worsen.

## 2021-09-14 LAB — CERVICOVAGINAL ANCILLARY ONLY
Bacterial Vaginitis (gardnerella): NEGATIVE
Candida Glabrata: NEGATIVE
Candida Vaginitis: NEGATIVE
Chlamydia: NEGATIVE
Comment: NEGATIVE
Comment: NEGATIVE
Comment: NEGATIVE
Comment: NEGATIVE
Comment: NEGATIVE
Comment: NORMAL
Neisseria Gonorrhea: NEGATIVE
Trichomonas: NEGATIVE

## 2021-09-19 ENCOUNTER — Telehealth (INDEPENDENT_AMBULATORY_CARE_PROVIDER_SITE_OTHER): Payer: Medicaid Other | Admitting: Obstetrics and Gynecology

## 2021-09-19 ENCOUNTER — Encounter: Payer: Self-pay | Admitting: Obstetrics and Gynecology

## 2021-09-19 DIAGNOSIS — B379 Candidiasis, unspecified: Secondary | ICD-10-CM | POA: Diagnosis not present

## 2021-09-19 DIAGNOSIS — Z302 Encounter for sterilization: Secondary | ICD-10-CM | POA: Insufficient documentation

## 2021-09-19 DIAGNOSIS — Z30016 Encounter for initial prescription of transdermal patch hormonal contraceptive device: Secondary | ICD-10-CM

## 2021-09-19 DIAGNOSIS — Z309 Encounter for contraceptive management, unspecified: Secondary | ICD-10-CM | POA: Insufficient documentation

## 2021-09-19 MED ORDER — TERCONAZOLE 0.4 % VA CREA: 1.0000 | TOPICAL_CREAM | Freq: Every day | VAGINAL | 0 refills | Status: DC

## 2021-09-19 MED ORDER — NORELGESTROMIN-ETH ESTRADIOL 150-35 MCG/24HR TD PTWK: 1.0000 | MEDICATED_PATCH | TRANSDERMAL | 12 refills | Status: AC

## 2021-09-19 NOTE — Progress Notes (Signed)
Pt presents for birth control consult. Pt interested in birth control patch. Pt also interested in tubal ligation. Pt tested negative for yeast infection 09/13/21 but states she is having symptoms of a yeast infection including vaginal itch and discharge. No other concerns at this time.

## 2021-09-21 NOTE — Progress Notes (Signed)
GYNECOLOGY VIRTUAL VISIT ENCOUNTER NOTE  Provider location: [facility] for [facility]   Patient location: Home  I connected with Colleen Stone on 09/21/21 at  3:50 PM EDT by MyChart Video Encounter and verified that I am speaking with the correct person using two identifiers.   I discussed the limitations, risks, security and privacy concerns of performing an evaluation and management service virtually and the availability of in person appointments. I also discussed with the patient that there may be a patient responsible charge related to this service. The patient expressed understanding and agreed to proceed.   History:  Colleen Stone is a 26 y.o. [id number] female being evaluated today for contraception management. She denies any abnormal vaginal discharge, bleeding, pelvic pain or other concerns.       Past Medical History:  Diagnosis Date   Anxiety    Asthma    Chlamydia    Gonorrhea    Hypertension    takes labetalol   MRSA infection    8th grade   Other acne 06/30/2008   Overview:  Acne   PCOS (polycystic ovarian syndrome)    Seizures (HCC)    Last seizure in this pregnancy not  sure when   Trichomonas infection    Past Surgical History:  Procedure Laterality Date   ADENOIDECTOMY     NASAL SEPTOPLASTY W/ TURBINOPLASTY     ROOT CANAL     TONSILLECTOMY     WISDOM TOOTH EXTRACTION     The following portions of the patient's history were reviewed and updated as appropriate: allergies, current medications, past family history, past medical history, past social history, past surgical history and problem list.   Review of Systems:  Pertinent items noted in HPI and remainder of comprehensive ROS otherwise negative.  Physical Exam:   General:  Alert, oriented and cooperative. Patient appears to be in no acute distress.  Mental Status: Normal mood and affect. Normal behavior. Normal judgment and thought content.   Respiratory: Normal respiratory  effort, no problems with respiration noted  Rest of physical exam deferred due to type of encounter  Labs and Imaging Results for orders placed or performed in visit on 09/13/21 (from the past 336 hour(s))  Cervicovaginal ancillary only( [place])   Collection Time: 09/13/21 11:37 AM  Result Value Ref Range   Neisseria Gonorrhea Negative    Chlamydia Negative    Trichomonas Negative    Bacterial Vaginitis (gardnerella) Negative    Candida Vaginitis Negative    Candida Glabrata Negative    Comment      Normal Reference Range Bacterial Vaginosis - Negative   Comment Normal Reference Range Candida Species - Negative    Comment Normal Reference Range Candida Galbrata - Negative    Comment Normal Reference Range Trichomonas - Negative    Comment Normal Reference Ranger Chlamydia - Negative    Comment      Normal Reference Range Neisseria Gonorrhea - Negative  POCT urine pregnancy   Collection Time: 09/13/21 11:40 AM  Result Value Ref Range   Preg Test, Ur Negative Negative   No results found.     Assessment and Plan:     1. Yeast infection  - terconazole (TERAZOL 7) 0.4 % vaginal cream; Place 1 applicator vaginally at bedtime. Use for seven days  Dispense: 45 g; Refill: 0  2. Encounter for initial prescription of transdermal patch hormonal contraceptive device  - norelgestromin-ethinyl estradiol [employer]) 150-35 MCG/24HR transdermal patch; Place 1 patch onto the  skin once a week.  Dispense: 3 patch; Refill: 12       I discussed the assessment and treatment plan with the patient. The patient was provided an opportunity to ask questions and all were answered. The patient agreed with the plan and demonstrated an understanding of the instructions.   The patient was advised to call back or seek an in-person evaluation/go to the ED if the symptoms worsen or if the condition fails to improve as anticipated.  I provided 8 minutes of face-to-face time during this  encounter.   Hermina Staggers, MD Center for [facility], [facility]

## 2021-10-04 NOTE — Patient Instructions (Addendum)
SURGICAL WAITING ROOM VISITATION Patients having surgery or a procedure may have no more than 2 support people in the waiting area - these visitors may rotate.   Children under the age of 5 must have an adult with them who is Stone the patient. If the patient needs to stay at the hospital during part of their recovery, the visitor guidelines for inpatient rooms apply. Pre-op nurse will coordinate an appropriate time for 1 support person to accompany patient in pre-op.  This support person may Stone rotate.    Please refer to the [facility] website for the visitor guidelines for Inpatients (after your surgery is over and you are in a regular room).    Your procedure is scheduled on: 10/26/21   Report to [facility] Main Entrance    Report to admitting at 8:00 AM   Call this number if you have problems the morning of surgery [phone]   Do Stone eat food :After Midnight.   After Midnight you may have the following liquids until 7:15 AM DAY OF SURGERY  Water Non-Citrus Juices (without pulp, NO RED) Carbonated Beverages Black Coffee (NO MILK/CREAM OR CREAMERS, sugar ok)  Clear Tea (NO MILK/CREAM OR CREAMERS, sugar ok) regular and decaf                             Plain Jell-O (NO RED)                                           Fruit ices (Stone with fruit pulp, NO RED)                                     Popsicles (NO RED)                                                               Sports drinks like Gatorade (NO RED)    The day of surgery:  Drink ONE (1) Pre-Surgery Clear Ensure at 7:15 AM the morning of surgery. Drink in one sitting. Do Stone sip.  This drink was given to you during your hospital  pre-op appointment visit. Nothing else to drink after completing the  Pre-Surgery Clear Ensure.          If you have questions, please contact your surgeon's office.   FOLLOW BOWEL PREP AND ANY ADDITIONAL PRE OP INSTRUCTIONS YOU RECEIVED FROM YOUR SURGEON'S OFFICE!!!     Oral  Hygiene is also important to reduce your risk of infection.                                    Remember - BRUSH YOUR TEETH THE MORNING OF SURGERY WITH YOUR REGULAR TOOTHPASTE   Take these medicines the morning of surgery with A SIP OF WATER: Tylenol, Labetalol                               You  may Stone have any metal on your body including hair pins, jewelry, and body piercing             Do Stone wear make-up, lotions, powders, perfumes, or deodorant  Do Stone wear nail polish including gel and S&S, artificial/acrylic nails, or any other type of covering on natural nails including finger and toenails. If you have artificial nails, gel coating, etc. that needs to be removed by a nail salon please have this removed prior to surgery or surgery may need to be canceled/ delayed if the surgeon/ anesthesia feels like they are unable to be safely monitored.   Do Stone shave  48 hours prior to surgery.    Do Stone bring valuables to the hospital. Colleen Stone             RESPONSIBLE   FOR VALUABLES.   Contacts, dentures or bridgework may Stone be worn into surgery.  DO Stone BRING YOUR HOME MEDICATIONS TO THE HOSPITAL. PHARMACY WILL DISPENSE MEDICATIONS LISTED ON YOUR MEDICATION LIST TO YOU DURING YOUR ADMISSION IN THE HOSPITAL!    Patients discharged on the day of surgery will Stone be allowed to drive home.  Someone NEEDS to stay with you for the first 24 hours after anesthesia.              Please read over the following fact sheets you were given: IF YOU HAVE QUESTIONS ABOUT YOUR PRE-OP INSTRUCTIONS PLEASE CALL [name]   If you received a COVID test during your pre-op visit  it is requested that you wear a mask when out in public, stay away from anyone that may Stone be feeling well and notify your surgeon if you develop symptoms. If you test positive for Covid or have been in contact with anyone that has tested positive in the last 10 days please notify you surgeon.     [place] -  Preparing for Surgery Before surgery, you can play an important role.  Because skin is Stone sterile, your skin needs to be as free of germs as possible.  You can reduce the number of germs on your skin by washing with CHG (chlorahexidine gluconate) soap before surgery.  CHG is an antiseptic cleaner which kills germs and bonds with the skin to continue killing germs even after washing. Please DO Stone use if you have an allergy to CHG or antibacterial soaps.  If your skin becomes reddened/irritated stop using the CHG and inform your nurse when you arrive at Short Stay. Do Stone shave (including legs and underarms) for at least 48 hours prior to the first CHG shower.  You may shave your face/neck.  Please follow these instructions carefully:  1.  Shower with CHG Soap the night before surgery and the  morning of surgery.  2.  If you choose to wash your hair, wash your hair first as usual with your normal  shampoo.  3.  After you shampoo, rinse your hair and body thoroughly to remove the shampoo.                             4.  Use CHG as you would any other liquid soap.  You can apply chg directly to the skin and wash.  Gently with a scrungie or clean washcloth.  5.  Apply the CHG Soap to your body ONLY FROM THE NECK DOWN.   Do   Stone use on face/ open  Wound or open sores. Avoid contact with eyes, ears mouth and   genitals (private parts).                       Wash face,  Genitals (private parts) with your normal soap.             6.  Wash thoroughly, paying special attention to the area where your    surgery  will be performed.  7.  Thoroughly rinse your body with warm water from the neck down.  8.  DO Stone shower/wash with your normal soap after using and rinsing off the CHG Soap.                9.  Pat yourself dry with a clean towel.            10.  Wear clean pajamas.            11.  Place clean sheets on your bed the night of your first shower and do Stone  sleep with  pets. Day of Surgery : Do Stone apply any lotions/deodorants the morning of surgery.  Please wear clean clothes to the hospital/surgery center.  FAILURE TO FOLLOW THESE INSTRUCTIONS MAY RESULT IN THE CANCELLATION OF YOUR SURGERY  PATIENT SIGNATURE_________________________________  NURSE SIGNATURE__________________________________  ________________________________________________________________________   Colleen Stone  An incentive spirometer is a tool that can help keep your lungs clear and active. This tool measures how well you are filling your lungs with each breath. Taking long deep breaths may help reverse or decrease the chance of developing breathing (pulmonary) problems (especially infection) following: A long period of time when you are unable to move or be active. BEFORE THE PROCEDURE  If the spirometer includes an indicator to show your best effort, your nurse or respiratory therapist will set it to a desired goal. If possible, sit up straight or lean slightly forward. Try Stone to slouch. Hold the incentive spirometer in an upright position. INSTRUCTIONS FOR USE  Sit on the edge of your bed if possible, or sit up as far as you can in bed or on a chair. Hold the incentive spirometer in an upright position. Breathe out normally. Place the mouthpiece in your mouth and seal your lips tightly around it. Breathe in slowly and as deeply as possible, raising the piston or the ball toward the top of the column. Hold your breath for 3-5 seconds or for as long as possible. Allow the piston or ball to fall to the bottom of the column. Remove the mouthpiece from your mouth and breathe out normally. Rest for a few seconds and repeat Steps 1 through 7 at least 10 times every 1-2 hours when you are awake. Take your time and take a few normal breaths between deep breaths. The spirometer may include an indicator to show your best effort. Use the indicator as a goal to work toward during  each repetition. After each set of 10 deep breaths, practice coughing to be sure your lungs are clear. If you have an incision (the cut made at the time of surgery), support your incision when coughing by placing a pillow or rolled up towels firmly against it. Once you are able to get out of bed, walk around indoors and cough well. You may stop using the incentive spirometer when instructed by your caregiver.  RISKS AND COMPLICATIONS Take your time so you do Stone get dizzy or light-headed. If you are in pain, you  may need to take or ask for pain medication before doing incentive spirometry. It is harder to take a deep breath if you are having pain. AFTER USE Rest and breathe slowly and easily. It can be helpful to keep track of a log of your progress. Your caregiver can provide you with a simple table to help with this. If you are using the spirometer at home, follow these instructions: [place] IF:  You are having difficultly using the spirometer. You have trouble using the spirometer as often as instructed. Your pain medication is Stone giving enough relief while using the spirometer. You develop fever of 100.5 F (38.1 C) or higher. SEEK IMMEDIATE MEDICAL CARE IF:  You cough up bloody sputum that had Stone been present before. You develop fever of 102 F (38.9 C) or greater. You develop worsening pain at or near the incision site. MAKE SURE YOU:  Understand these instructions. Will watch your condition. Will get help right away if you are Stone doing well or get worse. Document Released: 05/13/2006 Document Revised: 03/25/2011 Document Reviewed: 07/14/2006 [facility] Patient Information 2014 [place], [place].   ________________________________________________________________________

## 2021-10-15 ENCOUNTER — Encounter (HOSPITAL_COMMUNITY)
Admission: RE | Admit: 2021-10-15 | Discharge: 2021-10-15 | Disposition: A | Discharge status: Home / Self Care | Payer: Medicaid Other | Source: Ambulatory Visit | Attending: Orthopedic Surgery | Admitting: Orthopedic Surgery

## 2021-10-15 ENCOUNTER — Encounter (HOSPITAL_COMMUNITY): Payer: Self-pay

## 2021-10-15 VITALS — BP 135/85 | HR 75 | Temp 98.6°F | Resp 16 | Ht 64.5 in | Wt 266.0 lb

## 2021-10-15 DIAGNOSIS — I10 Essential (primary) hypertension: Secondary | ICD-10-CM | POA: Diagnosis not present

## 2021-10-15 DIAGNOSIS — Z01818 Encounter for other preprocedural examination: Secondary | ICD-10-CM | POA: Insufficient documentation

## 2021-10-15 HISTORY — DX: Gastro-esophageal reflux disease without esophagitis: K21.9

## 2021-10-15 HISTORY — DX: Headache, unspecified: R51.9

## 2021-10-15 LAB — CBC
HCT: 36.2 % (ref 36.0–46.0)
Hemoglobin: 10.9 g/dL — ABNORMAL LOW (ref 12.0–15.0)
MCH: 23.5 pg — ABNORMAL LOW (ref 26.0–34.0)
MCHC: 30.1 g/dL (ref 30.0–36.0)
MCV: 78 fL — ABNORMAL LOW (ref 80.0–100.0)
Platelets: 350 10*3/uL (ref 150–400)
RBC: 4.64 MIL/uL (ref 3.87–5.11)
RDW: 18.6 % — ABNORMAL HIGH (ref 11.5–15.5)
WBC: 8.4 10*3/uL (ref 4.0–10.5)
nRBC: 0 % (ref 0.0–0.2)

## 2021-10-15 LAB — BASIC METABOLIC PANEL
Anion gap: 4 — ABNORMAL LOW (ref 5–15)
BUN: 17 mg/dL (ref 6–20)
CO2: 25 mmol/L (ref 22–32)
Calcium: 8.6 mg/dL — ABNORMAL LOW (ref 8.9–10.3)
Chloride: 108 mmol/L (ref 98–111)
Creatinine, Ser: 0.6 mg/dL (ref 0.44–1.00)
GFR, Estimated: >60 mL/min (ref >60)
Glucose, Bld: 89 mg/dL (ref 70–99)
Potassium: 3.9 mmol/L (ref 3.5–5.1)
Sodium: 137 mmol/L (ref 135–145)

## 2021-10-15 NOTE — Progress Notes (Signed)
COVID Vaccine Completed: no  Date of COVID positive in last 90 days: no  PCP - n/a Cardiologist - n/a  Chest x-ray - n/a EKG - 10/15/21 Epic/chart Stress Test - n/a ECHO - n/a Cardiac Cath - n/a Pacemaker/ICD device last checked: n/a Spinal Cord Stimulator: n/a  Bowel Prep - no  Sleep Study - n/a CPAP -   Fasting Blood Sugar - n/a Checks Blood Sugar _____ times a day  Blood Thinner Instructions: n/a Aspirin Instructions: Last Dose:  Activity level: Can go up a flight of stairs and perform activities of daily living without stopping and without symptoms of chest pain or shortness of breath.    Anesthesia review:   Patient denies shortness of breath, fever, cough and chest pain at PAT appointment  Patient verbalized understanding of instructions that were given to them at the PAT appointment. Patient was also instructed that they will need to review over the PAT instructions again at home before surgery.

## 2021-10-16 LAB — SURGICAL PCR SCREEN
MRSA, PCR: NEGATIVE
Staphylococcus aureus: NEGATIVE

## 2021-10-26 ENCOUNTER — Encounter (HOSPITAL_COMMUNITY): Payer: Self-pay | Admitting: Orthopedic Surgery

## 2021-10-26 ENCOUNTER — Encounter (HOSPITAL_COMMUNITY)
Admission: RE | Disposition: A | Discharge status: Home / Self Care | Payer: Self-pay | Source: Home / Self Care | Attending: Orthopedic Surgery

## 2021-10-26 ENCOUNTER — Ambulatory Visit (HOSPITAL_BASED_OUTPATIENT_CLINIC_OR_DEPARTMENT_OTHER): Payer: Medicaid Other | Admitting: Certified Registered"

## 2021-10-26 ENCOUNTER — Ambulatory Visit (HOSPITAL_COMMUNITY)
Admission: RE | Admit: 2021-10-26 | Discharge: 2021-10-26 | Disposition: A | Discharge status: Home / Self Care | Payer: Medicaid Other | Attending: Orthopedic Surgery | Admitting: Orthopedic Surgery

## 2021-10-26 ENCOUNTER — Ambulatory Visit (HOSPITAL_COMMUNITY): Payer: Medicaid Other | Admitting: Certified Registered"

## 2021-10-26 ENCOUNTER — Other Ambulatory Visit: Payer: Self-pay

## 2021-10-26 DIAGNOSIS — Z6841 Body Mass Index (BMI) 40.0 and over, adult: Secondary | ICD-10-CM | POA: Diagnosis not present

## 2021-10-26 DIAGNOSIS — X58XXXA Exposure to other specified factors, initial encounter: Secondary | ICD-10-CM | POA: Diagnosis not present

## 2021-10-26 DIAGNOSIS — I10 Essential (primary) hypertension: Secondary | ICD-10-CM | POA: Insufficient documentation

## 2021-10-26 DIAGNOSIS — J45909 Unspecified asthma, uncomplicated: Secondary | ICD-10-CM | POA: Insufficient documentation

## 2021-10-26 DIAGNOSIS — Z87891 Personal history of nicotine dependence: Secondary | ICD-10-CM | POA: Diagnosis not present

## 2021-10-26 DIAGNOSIS — S83281A Other tear of lateral meniscus, current injury, right knee, initial encounter: Secondary | ICD-10-CM | POA: Diagnosis not present

## 2021-10-26 DIAGNOSIS — K219 Gastro-esophageal reflux disease without esophagitis: Secondary | ICD-10-CM | POA: Insufficient documentation

## 2021-10-26 DIAGNOSIS — S83241A Other tear of medial meniscus, current injury, right knee, initial encounter: Secondary | ICD-10-CM | POA: Insufficient documentation

## 2021-10-26 DIAGNOSIS — F419 Anxiety disorder, unspecified: Secondary | ICD-10-CM | POA: Insufficient documentation

## 2021-10-26 DIAGNOSIS — Z79899 Other long term (current) drug therapy: Secondary | ICD-10-CM | POA: Insufficient documentation

## 2021-10-26 DIAGNOSIS — S83511A Sprain of anterior cruciate ligament of right knee, initial encounter: Secondary | ICD-10-CM | POA: Insufficient documentation

## 2021-10-26 DIAGNOSIS — F319 Bipolar disorder, unspecified: Secondary | ICD-10-CM | POA: Insufficient documentation

## 2021-10-26 DIAGNOSIS — E282 Polycystic ovarian syndrome: Secondary | ICD-10-CM | POA: Insufficient documentation

## 2021-10-26 DIAGNOSIS — Z01818 Encounter for other preprocedural examination: Secondary | ICD-10-CM

## 2021-10-26 HISTORY — PX: MENISCUS REPAIR: SHX5179

## 2021-10-26 HISTORY — PX: KNEE ARTHROSCOPY WITH ANTERIOR CRUCIATE LIGAMENT (ACL) REPAIR WITH HAMSTRING GRAFT: SHX5645

## 2021-10-26 LAB — POCT PREGNANCY, URINE: Preg Test, Ur: NEGATIVE

## 2021-10-26 SURGERY — KNEE ARTHROSCOPY WITH ANTERIOR CRUCIATE LIGAMENT (ACL) REPAIR WITH HAMSTRING GRAFT
Anesthesia: Regional | Site: Knee | Laterality: Right

## 2021-10-26 MED ORDER — BUPIVACAINE HCL (PF) 0.25 % IJ SOLN
INTRAMUSCULAR | Status: AC
  Filled 2021-10-26: qty 30

## 2021-10-26 MED ORDER — MIDAZOLAM HCL 2 MG/2ML IJ SOLN
1.0000 mg | Freq: Once | INTRAMUSCULAR | Status: AC
  Administered 2021-10-26: 2 mg via INTRAVENOUS
  Filled 2021-10-26: qty 2

## 2021-10-26 MED ORDER — ROCURONIUM BROMIDE 10 MG/ML (PF) SYRINGE
PREFILLED_SYRINGE | INTRAVENOUS | Status: AC
  Filled 2021-10-26: qty 10

## 2021-10-26 MED ORDER — ONDANSETRON HCL 4 MG/2ML IJ SOLN
INTRAMUSCULAR | Status: AC
  Filled 2021-10-26: qty 2

## 2021-10-26 MED ORDER — PROPOFOL 10 MG/ML IV BOLUS
INTRAVENOUS | Status: DC | PRN
  Administered 2021-10-26: 200 mg via INTRAVENOUS
  Administered 2021-10-26: 30 mg via INTRAVENOUS

## 2021-10-26 MED ORDER — OXYCODONE-ACETAMINOPHEN 7.5-325 MG PO TABS: 1.0000 | ORAL_TABLET | Freq: Four times a day (QID) | ORAL | 0 refills | Status: DC | PRN

## 2021-10-26 MED ORDER — HYDROMORPHONE HCL 1 MG/ML IJ SOLN
0.2500 mg | INTRAMUSCULAR | Status: DC | PRN
  Administered 2021-10-26 (×3): 0.5 mg via INTRAVENOUS

## 2021-10-26 MED ORDER — DEXAMETHASONE SODIUM PHOSPHATE 10 MG/ML IJ SOLN
INTRAMUSCULAR | Status: AC
  Filled 2021-10-26: qty 1

## 2021-10-26 MED ORDER — LACTATED RINGERS IV SOLN: INTRAVENOUS | Status: DC

## 2021-10-26 MED ORDER — FENTANYL CITRATE (PF) 250 MCG/5ML IJ SOLN
INTRAMUSCULAR | Status: AC
  Filled 2021-10-26: qty 5

## 2021-10-26 MED ORDER — SUCCINYLCHOLINE CHLORIDE 200 MG/10ML IV SOSY
PREFILLED_SYRINGE | INTRAVENOUS | Status: AC
  Filled 2021-10-26: qty 10

## 2021-10-26 MED ORDER — OXYCODONE HCL 5 MG/5ML PO SOLN: 5.0000 mg | Freq: Once | ORAL | Status: AC | PRN

## 2021-10-26 MED ORDER — AMISULPRIDE (ANTIEMETIC) 5 MG/2ML IV SOLN: 10.0000 mg | Freq: Once | INTRAVENOUS | Status: DC | PRN

## 2021-10-26 MED ORDER — FENTANYL CITRATE PF 50 MCG/ML IJ SOSY
50.0000 ug | PREFILLED_SYRINGE | Freq: Once | INTRAMUSCULAR | Status: AC
  Administered 2021-10-26: 100 ug via INTRAVENOUS
  Filled 2021-10-26: qty 2

## 2021-10-26 MED ORDER — SODIUM CHLORIDE 0.9 % IR SOLN
Status: DC | PRN
  Administered 2021-10-26 (×3): 3000 mL
  Administered 2021-10-26: 1000 mL
  Administered 2021-10-26: 3000 mL

## 2021-10-26 MED ORDER — OXYCODONE HCL 5 MG PO TABS
5.0000 mg | ORAL_TABLET | Freq: Once | ORAL | Status: AC | PRN
  Administered 2021-10-26: 5 mg via ORAL

## 2021-10-26 MED ORDER — ONDANSETRON HCL 4 MG PO TABS: 4.0000 mg | ORAL_TABLET | Freq: Three times a day (TID) | ORAL | 1 refills | Status: DC | PRN

## 2021-10-26 MED ORDER — ROPIVACAINE HCL 5 MG/ML IJ SOLN
INTRAMUSCULAR | Status: DC | PRN
  Administered 2021-10-26: 30 mL via PERINEURAL

## 2021-10-26 MED ORDER — OXYCODONE HCL 5 MG PO TABS
ORAL_TABLET | ORAL | Status: AC
  Filled 2021-10-26: qty 1

## 2021-10-26 MED ORDER — MEPERIDINE HCL 50 MG/ML IJ SOLN: 6.2500 mg | INTRAMUSCULAR | Status: DC | PRN

## 2021-10-26 MED ORDER — HYDROMORPHONE HCL 1 MG/ML IJ SOLN
INTRAMUSCULAR | Status: AC
  Filled 2021-10-26: qty 2

## 2021-10-26 MED ORDER — DEXAMETHASONE SODIUM PHOSPHATE 10 MG/ML IJ SOLN
INTRAMUSCULAR | Status: DC | PRN
  Administered 2021-10-26: 10 mg via INTRAVENOUS

## 2021-10-26 MED ORDER — FENTANYL CITRATE (PF) 250 MCG/5ML IJ SOLN
INTRAMUSCULAR | Status: DC | PRN
  Administered 2021-10-26 (×5): 25 ug via INTRAVENOUS
  Administered 2021-10-26: 50 ug via INTRAVENOUS
  Administered 2021-10-26 (×3): 25 ug via INTRAVENOUS

## 2021-10-26 MED ORDER — PROMETHAZINE HCL 25 MG/ML IJ SOLN: 6.2500 mg | INTRAMUSCULAR | Status: DC | PRN

## 2021-10-26 MED ORDER — CEFAZOLIN IN SODIUM CHLORIDE 3-0.9 GM/100ML-% IV SOLN
3.0000 g | INTRAVENOUS | Status: AC
  Administered 2021-10-26: 3 g via INTRAVENOUS
  Filled 2021-10-26: qty 100

## 2021-10-26 MED ORDER — LIDOCAINE 2% (20 MG/ML) 5 ML SYRINGE
INTRAMUSCULAR | Status: DC | PRN
  Administered 2021-10-26: 60 mg via INTRAVENOUS

## 2021-10-26 MED ORDER — MIDAZOLAM HCL 2 MG/2ML IJ SOLN
INTRAMUSCULAR | Status: AC
  Filled 2021-10-26: qty 2

## 2021-10-26 MED ORDER — PROPOFOL 10 MG/ML IV BOLUS
INTRAVENOUS | Status: AC
  Filled 2021-10-26: qty 20

## 2021-10-26 MED ORDER — ACETAMINOPHEN 10 MG/ML IV SOLN
INTRAVENOUS | Status: DC | PRN
  Administered 2021-10-26: 1000 mg via INTRAVENOUS

## 2021-10-26 SURGICAL SUPPLY — 76 items
ANCH SUT 2-0 CVD FBRSTCH PLSTR (Anchor) ×5 IMPLANT
ANCHOR BUTTON TIGHTROPE 14 (Anchor) IMPLANT
BAG COUNTER SPONGE SURGICOUNT (BAG) ×1 IMPLANT
BAG SPNG CNTER NS LX DISP (BAG) ×1
BANDAGE ESMARK 6X9 LF (GAUZE/BANDAGES/DRESSINGS) IMPLANT
BLADE SURG 15 STRL LF DISP TIS (BLADE) ×1 IMPLANT
BLADE SURG 15 STRL SS (BLADE) ×1
BLADE SURG SZ10 CARB STEEL (BLADE) ×1 IMPLANT
BNDG CMPR 9X6 STRL LF SNTH (GAUZE/BANDAGES/DRESSINGS)
BNDG ELASTIC 6X5.8 VLCR STR LF (GAUZE/BANDAGES/DRESSINGS) ×1 IMPLANT
BNDG ESMARK 6X9 LF (GAUZE/BANDAGES/DRESSINGS)
BUR OVAL CARBIDE 4.0 (BURR) IMPLANT
BURR OVAL 8 FLU 4.0X13 (MISCELLANEOUS) IMPLANT
BURR OVAL 8 FLU 5.0X13 (MISCELLANEOUS) IMPLANT
COVER BACK TABLE 60X90IN (DRAPES) ×1 IMPLANT
CUFF TOURN SGL QUICK 34 (TOURNIQUET CUFF) ×1
CUFF TRNQT CYL 34X4.125X (TOURNIQUET CUFF) ×1 IMPLANT
CUTTER BONE 4.0MM X 13CM (MISCELLANEOUS) ×1 IMPLANT
CUTTER TENSIONER SUT 2-0 0 FBW (INSTRUMENTS) IMPLANT
DRAPE ARTHROSCOPY W/POUCH 114 (DRAPES) ×1 IMPLANT
DRAPE C-ARM 42X120 X-RAY (DRAPES) IMPLANT
DRAPE INCISE IOBAN 66X45 STRL (DRAPES) IMPLANT
DRAPE SHEET LG 3/4 BI-LAMINATE (DRAPES) IMPLANT
DRAPE U-SHAPE 47X51 STRL (DRAPES) ×1 IMPLANT
DURAPREP 26ML APPLICATOR (WOUND CARE) ×1 IMPLANT
ELECT REM PT RETURN 15FT ADLT (MISCELLANEOUS) ×1 IMPLANT
FIBERSTICK 2 (SUTURE) IMPLANT
Flip Cutter III Drill IMPLANT
GAUZE PAD ABD 8X10 STRL (GAUZE/BANDAGES/DRESSINGS) ×1 IMPLANT
GAUZE SPONGE 4X4 12PLY STRL (GAUZE/BANDAGES/DRESSINGS) ×1 IMPLANT
GAUZE XEROFORM 1X8 LF (GAUZE/BANDAGES/DRESSINGS) ×1 IMPLANT
GLOVE BIO SURGEON STRL SZ7.5 (GLOVE) ×2 IMPLANT
GLOVE INDICATOR 8.0 STRL GRN (GLOVE) ×2 IMPLANT
GRAFT TISS QUADRICEP TEND 9-11 (Tissue) IMPLANT
IMP SYS 2ND FIX PEEK 4.75X19.1 (Miscellaneous) ×1 IMPLANT
IMPL FIBERSTICH 2-0 CVD (Anchor) IMPLANT
IMPL SYS 2ND FX PEEK 4.75X19.1 (Miscellaneous) IMPLANT
IMPLANT FIBERSTICH 2-0 CVD (Anchor) ×5 IMPLANT
IV NS IRRIG 3000ML ARTHROMATIC (IV SOLUTION) ×4 IMPLANT
KIT BASIN OR (CUSTOM PROCEDURE TRAY) ×1 IMPLANT
KIT TURNOVER KIT A (KITS) IMPLANT
MANIFOLD NEPTUNE II (INSTRUMENTS) ×1 IMPLANT
NEEDLE HYPO 22GX1.5 SAFETY (NEEDLE) IMPLANT
PACK ARTHROSCOPY DSU (CUSTOM PROCEDURE TRAY) ×1 IMPLANT
PAD ARMBOARD 7.5X6 YLW CONV (MISCELLANEOUS) IMPLANT
PADDING CAST COTTON 6X4 STRL (CAST SUPPLIES) IMPLANT
PENCIL SMOKE EVACUATOR (MISCELLANEOUS) IMPLANT
PORT APPOLLO RF 90DEGREE MULTI (SURGICAL WAND) IMPLANT
PORTAL SKID DEVICE (INSTRUMENTS) IMPLANT
PROBE HOOK APOLLO (SURGICAL WAND) IMPLANT
SPONGE T-LAP 4X18 ~~LOC~~+RFID (SPONGE) ×1 IMPLANT
STRIP CLOSURE SKIN 1/2X4 (GAUZE/BANDAGES/DRESSINGS) ×1 IMPLANT
SUCTION FRAZIER HANDLE 10FR (MISCELLANEOUS) ×1
SUCTION TUBE FRAZIER 10FR DISP (MISCELLANEOUS) ×1 IMPLANT
SUT 2 FIBERLOOP 20 STRT BLUE (SUTURE)
SUT FIBERWIRE #2 38 REV NDL BL (SUTURE)
SUT FIBERWIRE #2 38 T-5 BLUE (SUTURE)
SUT MNCRL AB 3-0 PS2 18 (SUTURE) ×1 IMPLANT
SUT VIC AB 0 CT1 36 (SUTURE) IMPLANT
SUT VIC AB 0 CT2 27 (SUTURE) IMPLANT
SUT VIC AB 2-0 CT2 27 (SUTURE) ×1 IMPLANT
SUT VICRYL 0 UR6 27IN ABS (SUTURE) ×1 IMPLANT
SUTURE 2 FIBERLOOP 20 STRT BLU (SUTURE) IMPLANT
SUTURE FIBERWR #2 38 T-5 BLUE (SUTURE) IMPLANT
SUTURE FIBERWR#2 38 REV NDL BL (SUTURE) IMPLANT
SUTURE TIGERSTICK 2 TIGERWIR 2 (MISCELLANEOUS) IMPLANT
SYR CONTROL 10ML LL (SYRINGE) ×1 IMPLANT
SYS ALLOGRAFT GRAFTLINK CP2 (Anchor) ×1 IMPLANT
SYSTEM ALLOGRAFT GRAFTLINK CP2 (Anchor) IMPLANT
TIGERSTICK 2 TIGERWIRE 2 (MISCELLANEOUS)
TISSUE FLEXIGRAFT QUADLINK (Tissue) ×1 IMPLANT
TOWEL OR 17X26 10 PK STRL BLUE (TOWEL DISPOSABLE) ×1 IMPLANT
TUBING ARTHROSCOPY IRRIG 16FT (MISCELLANEOUS) ×1 IMPLANT
TUBING CONNECTING 10 (TUBING) ×1 IMPLANT
WAND APOLLORF SJ50 AR-9845 (SURGICAL WAND) ×1 IMPLANT
WATER STERILE IRR 500ML POUR (IV SOLUTION) ×1 IMPLANT

## 2021-10-26 NOTE — Anesthesia Postprocedure Evaluation (Signed)
Anesthesia Post Note  Patient: Secondary school teacher  Procedure(s) Performed: KNEE ARTHROSCOPY RECONSTRUCTION ANTERIOR CRUCIATE LIGAMENT (ACL) WITH QUADRICEPS ALLOGRAFT (Right: Knee) REPAIR OF  MEDIAL MENISCUS (Right: Knee)     Patient location during evaluation: PACU Anesthesia Type: Regional and General Level of consciousness: awake and alert Pain management: pain level controlled Vital Signs Assessment: post-procedure vital signs reviewed and stable Respiratory status: spontaneous breathing, nonlabored ventilation and respiratory function stable Cardiovascular status: blood pressure returned to baseline and stable Postop Assessment: no apparent nausea or vomiting Anesthetic complications: no   No notable events documented.  Last Vitals:  Vitals:   10/26/21 1300 10/26/21 1320  BP: (!) 146/108 (!) 129/90  Pulse: 88 70  Resp: 18   Temp: 36.7 C 36.4 C  SpO2: 95% 100%    Last Pain:  Vitals:   10/26/21 1320  TempSrc: Oral  PainSc: 4                  Lynda Rainwater

## 2021-10-26 NOTE — Discharge Instructions (Addendum)
DISCHARGE INSTRUCTIONS: ________________________________________________________________________________ ACL RECONSTRUCTION HOME EXERCISE PROGRAM (0-2 WEEKS)   Elevate the leg above your heart as often as possible. Weight bear as tolerated with the bledsoe knee brace.  Use crutches as needed, progress from 2 to 1 crutch as able using one crutch on opposite side of surgical knee.  Do not limp and do not walk too much!! Wear immobilizer all the time except when exercising.  Wear immobilizer at night!! Start normal showering according to your surgeon's instructions. Goals for first two weeks:  minimal swelling, motion 0-90, walking with immobilizer without crutches and positive attitude about PT. Exercise program to be performed as soon as as able 3-4 times per day followed by ice pack or ice bag for 20 minutes. Sitting over edge of bed or counter - Passive Range of Motion using opposite leg to bend and straighten knee as much as possible (5-10 minutes) Thigh tensing exercise - Push the knee into a towel roll and try to raise heel slightly off floor.  Hold for 8 seconds, rest 10 seconds. (15 times every hour) Straight leg raise lying on your back with opposite knee bent, keeping surgical knee as straight as possible.  You may do with knee immobilizer initially (3-5 sets of ten) Hamstring tensing - Dig heels into floor as if you were attempting to bend knee.  Do not allow knee to bend.  Hold for 8 seconds, rest 10 seconds Towel stretch - Towel around ball of foot stretching calf by pulling the ankle back while gradually leaning forward to stretch the back of the knee and thigh.  (Hold 20 seconds, 4 times each) Back of knee stretch - While lying on stomach, knee just over edge of bed (hold 2 minutes, 4 times) Use pain medication as needed.  To prevent constipation use Colace 100mg . twice a day while on pain medication.  If constipated, use Miralax 17 gm once a day and drink plenty of fluids.  These  medications can be obtained at the pharmacy without a prescription.   Follow up in the office in 14 days. Leave dressing in place for 3 days, and remove at that time and you may Shower at that point.  Do NOT submerge under water for 2 weeks. Take an 81 mg aspirin x 6 weeks for prevention of DVT.

## 2021-10-26 NOTE — Anesthesia Procedure Notes (Signed)
Anesthesia Regional Block: Adductor canal block   Pre-Anesthetic Checklist: , timeout performed,  Correct Patient, Correct Site, Correct Laterality,  Correct Procedure, Correct Position, site marked,  Risks and benefits discussed,  Surgical consent,  Pre-op evaluation,  At surgeon's request and post-op pain management  Laterality: Right  Prep: chloraprep       Needles:  Injection technique: Single-shot  Needle Type: Stimiplex     Needle Length: 9cm  Needle Gauge: 21     Additional Needles:   Procedures:,,,, ultrasound used (permanent image in chart),,    Narrative:  Start time: 10/26/2021 9:02 AM End time: 10/26/2021 9:07 AM Injection made incrementally with aspirations every 5 mL.  Performed by: Personally  Anesthesiologist: Lynda Rainwater, MD

## 2021-10-26 NOTE — H&P (Signed)
ORTHOPAEDIC H and P  REQUESTING PHYSICIAN: Nicholes Stairs, MD  PCP:  Patient, No Pcp Per  Chief Complaint: Right knee anterior crucial ligament tear  HPI: Colleen Stone is a 26 y.o. female who complains of knee pain and instability following an accident a few months ago.  She has had conservative treatment with outpatient therapy and activity modification.  However, continues with instability episodes.  She is here today for anterior crucial ligament reconstruction with possible medial meniscus repair.  We discussed again the indication for the procedure.  No new complaints at this time.  Past Medical History:  Diagnosis Date   Anxiety    Asthma    Chlamydia    GERD (gastroesophageal reflux disease)    Gonorrhea    Headache    migraines   Hypertension    takes labetalol   MRSA infection    8th grade   Other acne 06/30/2008   Overview:  Acne   PCOS (polycystic ovarian syndrome)    Seizures (HCC)    Last seizure in this pregnancy not  sure when   Trichomonas infection    Past Surgical History:  Procedure Laterality Date   ADENOIDECTOMY     NASAL SEPTOPLASTY W/ TURBINOPLASTY     ROOT CANAL     TONSILLECTOMY     WISDOM TOOTH EXTRACTION     Social History   Socioeconomic History   Marital status: Single    Spouse name: Not on file   Number of children: Not on file   Years of education: Not on file   Highest education level: Not on file  Occupational History   Not on file  Tobacco Use   Smoking status: Former    Types: Cigarettes    Quit date: 10/03/2017    Years since quitting: 4.0   Smokeless tobacco: Never  Vaping Use   Vaping Use: Former   Substances: Nicotine, Flavoring  Substance and Sexual Activity   Alcohol use: Not Currently    Comment: not since pregnancy   Drug use: Not Currently    Types: Marijuana    Comment: Last marijuana 09/08/20   Sexual activity: Yes    Partners: Male    Birth control/protection: None  Other Topics Concern    Not on file  Social History Narrative   Not on file   Social Determinants of Health   Financial Resource Strain: Low Risk  (06/22/2018)   Overall Financial Resource Strain (CARDIA)    Difficulty of Paying Living Expenses: Not hard at all  Food Insecurity: No Food Insecurity (12/01/2019)   Hunger Vital Sign    Worried About Running Out of Food in the Last Year: Never true    Ran Out of Food in the Last Year: Never true  Transportation Needs: No Transportation Needs (12/01/2019)   PRAPARE - Hydrologist (Medical): No    Lack of Transportation (Non-Medical): No  Physical Activity: Not on file  Stress: No Stress Concern Present (06/22/2018)   [place]    Feeling of Stress : Not at all  Social Connections: Not on file   Family History  Problem Relation Age of Onset   Asthma Mother    Heart disease Mother        murmur   Hypertension Mother    Heart disease Father    Allergies  Allergen Reactions   Red Dye Nausea And Vomiting and Other (See Comments)  Her body rejects it   Latex Hives and Swelling   Prior to Admission medications   Medication Sig Start Date End Date Taking? Authorizing Provider  aspirin-acetaminophen-caffeine (EXCEDRIN MIGRAINE) [phone] MG tablet Take 1-2 tablets by mouth every 6 (six) hours as needed for headache.   Yes [provider]  labetalol (NORMODYNE) 200 MG tablet Take 200 mg by mouth 2 (two) times daily as needed (elevated blood pressure.). 06/26/21  Yes [provider]  norelgestromin-ethinyl estradiol Marilu Favre) 150-35 MCG/24HR transdermal patch Place 1 patch onto the skin once a week. Patient taking differently: Place 1 patch onto the skin See admin instructions. Apply 1 patch weekly for 3 weeks, then off x 1 week--repeat 09/19/21  Yes Chancy Milroy, MD  acetaminophen (TYLENOL) 500 MG tablet Take 2 tablets (1,000 mg total) by mouth every 8  (eight) hours as needed (pain). Patient not taking: Reported on 10/09/2021 04/19/21   Patriciaann Clan, DO  terconazole (TERAZOL 7) 0.4 % vaginal cream Place 1 applicator vaginally at bedtime. Use for seven days Patient not taking: Reported on 10/09/2021 09/19/21   Chancy Milroy, MD  cetirizine (ZYRTEC ALLERGY) 10 MG tablet Take 1 tablet (10 mg total) by mouth 2 (two) times daily. 06/21/19 06/25/19  Muthersbaugh, Jarrett Soho, PA-C   No results found.  Positive ROS: All other systems have been reviewed and were otherwise negative with the exception of those mentioned in the HPI and as above.  Physical Exam: General: Alert, no acute distress Cardiovascular: No pedal edema Respiratory: No cyanosis, no use of accessory musculature GI: No organomegaly, abdomen is soft and non-tender Skin: No lesions in the area of chief complaint Neurologic: Sensation intact distally Psychiatric: Patient is competent for consent with normal mood and affect Lymphatic: No axillary or cervical lymphadenopathy  MUSCULOSKELETAL: Right lower extremity is warm and well-perfused with no open wounds or lesions.  Distally she is neurovascular intact.  No signs of DVT.  Assessment: Right knee anterior crucial ligament tear, complete and acute Right knee medial meniscus tear, acute   Plan: Plan to proceed today with arthroscopic intervention of the right knee.  We discussed arthroscopic ACL reconstruction with allograft given her low demand lifestyle.  We also discussed possible medial meniscus repair versus partial medial meniscectomy.  -We discussed risk and benefits of the procedure in detail including but not limited to bleeding, infection, damage to surrounding nerves and vessels, stiffness, failure of pain relief, persistent instability and need for revision surgery as well as the development of arthritis.  She has provided informed consent.  -Plan for discharge home postoperatively from PACU.    Nicholes Stairs,  MD Cell [phone]    10/26/2021 9:15 AM

## 2021-10-26 NOTE — Anesthesia Preprocedure Evaluation (Signed)
Anesthesia Evaluation  Patient identified by MRN, date of birth, ID band Patient awake    Reviewed: Allergy & Precautions, NPO status , Patient's Chart, lab work & pertinent test results, reviewed documented beta blocker date and time   History of Anesthesia Complications Negative for: history of anesthetic complications  Airway Mallampati: II  TM Distance: >3 FB Neck ROM: Full    Dental  (+) Dental Advisory Given   Pulmonary asthma , former smoker,    Pulmonary exam normal        Cardiovascular hypertension, Pt. on medications and Pt. on home beta blockers Normal cardiovascular exam     Neuro/Psych  Headaches, Seizures -,  PSYCHIATRIC DISORDERS Anxiety Bipolar Disorder    GI/Hepatic Neg liver ROS, GERD  ,  Endo/Other  Morbid obesity  Renal/GU negative Renal ROS     Musculoskeletal negative musculoskeletal ROS (+)   Abdominal (+) + obese,   Peds  Hematology  (+) Blood dyscrasia, anemia ,   Anesthesia Other Findings   Reproductive/Obstetrics  PCOS                              Anesthesia Physical  Anesthesia Plan  ASA: III  Anesthesia Plan: General and Regional   Post-op Pain Management: Regional block*, Dilaudid IV and Ofirmev IV (intra-op)*   Induction: Intravenous  PONV Risk Score and Plan: 3 and Treatment may vary due to age or medical condition, Ondansetron, Dexamethasone and Midazolam  Airway Management Planned: LMA  Additional Equipment: None  Intra-op Plan:   Post-operative Plan: Extubation in OR  Informed Consent: I have reviewed the patients History and Physical, chart, labs and discussed the procedure including the risks, benefits and alternatives for the proposed anesthesia with the patient or authorized representative who has indicated his/her understanding and acceptance.       Plan Discussed with: Anesthesiologist  Anesthesia Plan Comments:          Anesthesia Quick Evaluation

## 2021-10-26 NOTE — Op Note (Signed)
Surgery Date: 10/26/2021    Surgeon(s): Yolonda Kida, MD   ASSIST: Dion Saucier, PA-C  Assistant attestation:  PA Mcclung present for the entire procedure.   Implants:  Arthrex all inside cortical buttons on femur and tibia 4.75 PEEK swivel lock x 1.  Arthrex meniscal repair devices for medial and lateral meniscus repair x4   ANESTHESIA:  general, and adductor block   IV FLUIDS AND URINE: See anesthesia.   TOURNIQUET: 0 minutes   DRAINS: none   COMPLICATIONS: None.     ESTIMATED BLOOD LOSS: minimal   PREOPERATIVE DIAGNOSES:  1.  Right knee anterior crucial ligament disruption 2.  Right knee medial meniscus tear, initial encounter   POSTOPERATIVE DIAGNOSES:  1.  Right knee anterior crucial ligament disruption 2.  Right knee medial meniscus tear, initial encounter 3.  Right knee lateral meniscus tear, acute, initial encounter.   PROCEDURES PERFORMED:  1.  Right knee arthroscopy with anterior cruciate ligament reconstruction with quadriceps allograft 2.  Right knee medial and lateral meniscus repairs   DESCRIPTION OF PROCEDURE:  Colleen Stone is a very nice 26 year old female with right knee medial meniscus tear, Complete ACL rupture, partial MCL tear, possible lateral meniscus tear.  They sustained these injuries about 2 months prior to coming to the operating room today.  After a short period of prehabilitation to allow for return of ROM and quadriceps strenght, we discussed proceeding with arthroscopically assisted hamstring autograft ACL reconstruction and medial meniscectomy versus repair.  We reviewed the risks benefits and indications of this procedure including but not limited to bleeding, infection, damage to neurovascular structures, need for future surgery, developed an of arthrosis, rupture of graft, continued instability of the knee, and developement of blood clots and risk of anesthesia.  All questions answered.   The patient was identified  in the preoperative holding area and the operative extremity was marked. The patient was brought to the operating room and transferred to operating table in a supine position. Satisfactory general anesthesia was induced by anesthesiology.     Examination under anesthesia revealed a grade 2B Lachman, grade 2 pivot shift, and stable to varus and valgus stress.      At the back table, we next prepared the graft by removing muscle and tenosynovium.  The quadriceps allograft was then prepared for ACL reconstruction.  Arthrex ACL TightRope for the femoral side and ABS tightrope for the tibial side. The two free ends of the autograft were secured to each other at the tibial side with #2 interlocking FiberWire sutures.  The graft was pretensioned on the back table and wrapped in a saline-soaked gauze.  The graft measured 70 mm in total length, 10.0 mm on femoral tunnel diameter, and 9.5 mm diameter on the tibial tunnel.   Standard anterolateral, anteromedial arthroscopy portals were obtained. The anteromedial portal was obtained with a spinal needle for localization under direct visualization with subsequent diagnostic findings.    Anteromedial and anterolateral chambers: mild synovitis. The synovitis was debrided with a 4.5 mm full radius shaver through both the anteromedial and lateral portals.    Suprapatellar pouch and gutters: no synovitis or debris. Patella chondral surface: Grade 1 Trochlear chondral surface: Grade 0 Patellofemoral tracking: Midline, no tilt Medial meniscus: Vertical tear in the red-white junction posterior horn measuring about 2 cm. Medial femoral condyle flexion bearing surface: Grade 0 Medial femoral condyle extension bearing surface: Grade 0 Medial tibial plateau: Grade 0 Anterior cruciate ligament:Complete mid substance tear Posterior cruciate ligament:stable Lateral meniscus:  Red-white vertical tear at the mid body just at the level of the popliteal hiatus  lateral femoral  condyle flexion bearing surface: Grade 0 Lateral femoral condyle extension bearing surface: Grade 0 Lateral tibial plateau: Grade 1   Next, We turned our attention to the medial meniscus tear.  We identified the vertical tear in the posterior horn.  This was in the red-white junction.  We prepared this biologically with motorized shaver as well as trephination with spinal needle.  We then introduced the Arthrex all inside repair device.  We placed a oblique vertical mattress suture across this torn vertical tissue.  This created a secure healing bed for the meniscus.  This was stable when probed.  Next, the lateral meniscus was likewise repaired.  We identified a vertical tear in the red-white junction just across the popliteal hiatus.  We elected to fix this as well.  We prepared biologically with motorized shaver and trephinated with spinal needle.  We then placed 2 vertical mattress sutures into the tear zone.  This had excellent fixation.  Stable when probed.   Next, the ACL reconstruction was undertaken. The ACL stump was removed with thermal ablation and shaver and anatomic bony landmarks were marked for the placement of the femoral and tibial sockets.  Arthrex retroguides and Flipcutters were used to create the sockets and perform the procedure by an all-inside GraftLink technique.  The femoral socket was created at the inferior portion of the bifurcate ridge of the lateral femoral wall with a size 10 mm FlipCutter to a depth of  15 mm while the tibial socket was created at the center of the ACL footprint from front to back and toward the base of the medial tibial eminence from medial to lateral, to a depth of 23-25 mm with a 9.5 mm FlipCutter. Bony debris was removed and the edges of socket apertures were smoothed. Suture shuttles were used to deliver the graft into the femoral socket first and the tibial socket second. The graft was then secured within the sockets, cinching the self-locking sutures  overtop of the proximal and distal cortical buttons with the knee in a reduced position maintained at 20 degrees flexion while a moderate force posterior drawer was applied.  After this preliminary tensioning, the knee was placed through several flexion-extension cycles to eliminate any graft settling or excursion and the graft was re-tensioned in the same manner and the sutures were tied over top of the buttons proximally and distally, and the four tibial sided suture arms were secondarily secured at the proximal tibia with 1 SwiveLock anchor. Of note we did also pass a free labral tape through the ACL fixation as an separate internal brace backup fixation.   Final images of the ACL graft were obtained, revealing no lateral wall or roof impingement of the graft at the notch through range of motion.  Stability of the ACL graft was assessed and found to be normalized at grade 0 lachman and grade 0 pivot shift.    The wounds were all closed in layers per usual.  Dressings were applied and a brace placed And locked in 0 of flexion..  There were no apparent complications.  The patient was awakened and taken to recovery room in satisfactory condition.   POSTOPERATIVE PLAN:  Colleen Stone will be touch down weight bearing on crutches until cleared by The therapist.  She may then progress to full weightbearing as tolerated once the quadriceps is active.  They will likewise be in The knee  brace with it locked until quad function is normalized. They will be on 81 mg asa daily for 1 month for DVT PPX.  they will return to the clinic to see the surgeon in 2 weeks.   Colleen Stone

## 2021-10-26 NOTE — Transfer of Care (Signed)
Immediate Anesthesia Transfer of Care Note  Patient: Colleen Stone  Procedure(s) Performed: KNEE ARTHROSCOPY RECONSTRUCTION ANTERIOR CRUCIATE LIGAMENT (ACL) WITH QUADRICEPS ALLOGRAFT (Right: Knee) REPAIR OF  MEDIAL MENISCUS (Right: Knee)  Patient Location: PACU  Anesthesia Type:Regional and GA combined with regional for post-op pain  Level of Consciousness: awake, alert , oriented and patient cooperative  Airway & Oxygen Therapy: Patient Spontanous Breathing and Patient connected to face mask oxygen  Post-op Assessment: Report given to RN and Post -op Vital signs reviewed and stable  Post vital signs: Reviewed and stable  Last Vitals:  Vitals Value Taken Time  BP 102/64 10/26/21 1203  Temp    Pulse 95 10/26/21 1205  Resp 25 10/26/21 1205  SpO2 100 % 10/26/21 1205  Vitals shown include unvalidated device data.  Last Pain:  Vitals:   10/26/21 0958  TempSrc:   PainSc: 0-No pain         Complications: No notable events documented.

## 2021-10-26 NOTE — Progress Notes (Signed)
Orthopedic Tech Progress Note Patient Details:  Colleen Stone 07/09/1995 [id number]  Ortho Devices Type of Ortho Device: Crutches Ortho Device/Splint Interventions: Ordered, Adjustment      [name] E [name] 10/26/2021, 2:03 PM

## 2021-10-26 NOTE — Brief Op Note (Addendum)
10/26/2021  11:56 AM  PATIENT:  Colleen Stone  25 y.o. female  PRE-OPERATIVE DIAGNOSIS:  Right knee anterior cruciate ligament tear, medial meniscus tear  POST-OPERATIVE DIAGNOSIS:  Right knee anterior cruciate ligament tear, medial meniscus tear, lateral meniscus tear, acute.  PROCEDURE:  Procedure(s) with comments: KNEE ARTHROSCOPY RECONSTRUCTION ANTERIOR CRUCIATE LIGAMENT (ACL) WITH QUADRICEPS ALLOGRAFT (Right) - 120 REPAIR OF  MEDIAL MENISCUS (Right) - 120, lateral meniscus repair.  SURGEON:  Surgeon(s) and Role:    * Kylie Gros, Elly Modena, MD - Primary  PHYSICIAN ASSISTANT: Jonelle Sidle, PA-C  ANESTHESIA:   regional and general  EBL: 75 cc  BLOOD ADMINISTERED:none  DRAINS: none   LOCAL MEDICATIONS USED:  NONE  SPECIMEN:  No Specimen  DISPOSITION OF SPECIMEN:  N/A  COUNTS:  YES  TOURNIQUET:  * No tourniquets in log *  DICTATION: .Note written in EPIC  PLAN OF CARE: Discharge to home after PACU  PATIENT DISPOSITION:  PACU - hemodynamically stable.   Delay start of Pharmacological VTE agent (>24hrs) due to surgical blood loss or risk of bleeding: not applicable

## 2021-10-26 NOTE — Anesthesia Procedure Notes (Signed)
Procedure Name: LMA Insertion Date/Time: 10/26/2021 10:19 AM  Performed by: Cleda Daub, CRNAPre-anesthesia Checklist: Patient identified, Emergency Drugs available, Suction available and Patient being monitored Patient Re-evaluated:Patient Re-evaluated prior to induction Oxygen Delivery Method: Circle system utilized Preoxygenation: Pre-oxygenation with 100% oxygen Induction Type: IV induction LMA: LMA inserted LMA Size: 4.0 Number of attempts: 1 Placement Confirmation: positive ETCO2 and breath sounds checked- equal and bilateral Tube secured with: Tape Dental Injury: Teeth and Oropharynx as per pre-operative assessment

## 2021-10-31 ENCOUNTER — Encounter (HOSPITAL_COMMUNITY): Payer: Self-pay | Admitting: Orthopedic Surgery

## 2021-11-23 ENCOUNTER — Encounter: Payer: Self-pay | Admitting: Obstetrics and Gynecology

## 2021-11-23 ENCOUNTER — Ambulatory Visit (INDEPENDENT_AMBULATORY_CARE_PROVIDER_SITE_OTHER): Payer: Medicaid Other | Admitting: Obstetrics and Gynecology

## 2021-11-23 VITALS — BP 125/84 | HR 91 | Ht 64.5 in | Wt 255.0 lb

## 2021-11-23 DIAGNOSIS — Z3009 Encounter for other general counseling and advice on contraception: Secondary | ICD-10-CM | POA: Diagnosis not present

## 2021-11-23 DIAGNOSIS — Z6841 Body Mass Index (BMI) 40.0 and over, adult: Secondary | ICD-10-CM

## 2021-11-23 NOTE — Progress Notes (Signed)
  CC: tubal consultation Subjective:    Patient ID: Colleen Stone, female    DOB: 12/05/1995, 26 y.o.   MRN: [id number]  HPI 65 to G3P2 seen for consultation regarding tubal ligation.  Pt currently using contraceptive patch, but desires permanent sterilization.  Tubal form signed today.  She has used nexplanon x 2 and has had an IUD in the past which was misplaced or perforated and does not want to try it again.   Patient desires permanent sterilization.  Other reversible forms of contraception (over the counter/barrier methods; hormonal contraceptives including pill, patch, ring, Depo-Provera injection, Nexplanon implant; hormonal IUDs Skyla and Mirena; nonhormonal copper IUD Paragard) were discussed with patient; she declined all these modalities. Also discussed the option of vasectomy for her female partner; she also declined this option. She was given the choice between laparoscopic bilateral tubal sterilization using Filshie clips or laparoscopic bilateral salpingectomy. For the Filshie clip sterilization, she was told she will have one incision in her umbilicus.  Failure risk of 1-2 % with increased risk of ectopic gestation if pregnancy occurs was also discussed with patient. For the bilateral salpingectomy, she was told that both tubes will be resected via three small incisions; the failure risk of less than 1%.  Any future pregnancies will have to be attempted via IVF or other fertility procedures.  Reiterated permanence and irreversibility of both procedures; in the case of Filshie clip application, attempts to reverse tubal sterilization are often not successful.  Also emphasized risk of regret which is noted more in patients less than the age of 63.  All questions were answered. She desires laparoscopic bilateral salpingectomy.  Other risks of the procedure were discussed with patient including but not limited to: bleeding, infection, injury to surrounding organs and need for additional  procedures.  Also discussed possibility of post-tubal pain syndrome. Patient verbalized understanding of these risks and wants to proceed with this procedure.  She was told that she will be contacted by our surgical scheduler regarding the time and date of her surgery; routine preoperative instructions of having nothing to eat or drink after midnight on the day prior to surgery and also coming to the hospital 1.5 hours prior to her time of surgery were also emphasized.  She was told she may be called for a preoperative appointment about a week prior to surgery and will be given further preoperative instructions at that visit. Printed patient education handouts about the procedure were given to the patient to review at home. Medicaid papers were signed today.     Review of Systems     Objective:   Physical Exam Vitals:   11/23/21 0957  BP: 125/84  Pulse: 91         Assessment & Plan:   1. BMI 45.0-49.9, adult (HCC) Noted due to obesity and potential complexity of the procedure  2. Encounter for other general counseling and advice on contraception Pt desires permanent sterilization.  Alternatives for contraception as well as risks and benefits of surgery have been explained.  Will send information to scheduler.  I spent 20 minutes dedicated to the care of this patient including previsit review of records, face to face time with the patient discussing treatment options and post visit testing.   Warden Fillers, MD Faculty Attending, [facility] for [facility]

## 2021-12-26 ENCOUNTER — Other Ambulatory Visit (HOSPITAL_COMMUNITY)
Admission: RE | Admit: 2021-12-26 | Discharge: 2021-12-26 | Disposition: A | Discharge status: Home / Self Care | Payer: Medicaid Other | Source: Ambulatory Visit | Attending: Obstetrics and Gynecology | Admitting: Obstetrics and Gynecology

## 2021-12-26 ENCOUNTER — Ambulatory Visit: Payer: Medicaid Other

## 2021-12-26 ENCOUNTER — Ambulatory Visit (INDEPENDENT_AMBULATORY_CARE_PROVIDER_SITE_OTHER): Payer: Medicaid Other

## 2021-12-26 VITALS — BP 136/88 | HR 112 | Ht 64.0 in | Wt 266.0 lb

## 2021-12-26 DIAGNOSIS — N898 Other specified noninflammatory disorders of vagina: Secondary | ICD-10-CM

## 2021-12-26 NOTE — Progress Notes (Addendum)
SUBJECTIVE:  26 y.o. female complains of creamy and malodorous vaginal discharge and itching for 7 day(s). Denies abnormal vaginal bleeding or significant pelvic pain or fever. No UTI symptoms. Denies history of known exposure to STD.  Patient's last menstrual period was 11/29/2021 (approximate).  OBJECTIVE:  She appears well, afebrile. Urine dipstick: not done.  ASSESSMENT:  Vaginal Discharge  Vaginal Odor Vaginal Itching   PLAN:  GC, chlamydia, trichomonas, BVAG, CVAG probe sent to lab. Treatment: To be determined once lab results are received ROV prn if symptoms persist or worsen.

## 2021-12-27 ENCOUNTER — Encounter (HOSPITAL_BASED_OUTPATIENT_CLINIC_OR_DEPARTMENT_OTHER): Payer: Self-pay | Admitting: Obstetrics and Gynecology

## 2021-12-27 LAB — CERVICOVAGINAL ANCILLARY ONLY
Bacterial Vaginitis (gardnerella): NEGATIVE
Candida Glabrata: POSITIVE — AB
Candida Vaginitis: NEGATIVE
Chlamydia: NEGATIVE
Comment: NEGATIVE
Comment: NEGATIVE
Comment: NEGATIVE
Comment: NEGATIVE
Comment: NEGATIVE
Comment: NORMAL
Neisseria Gonorrhea: NEGATIVE
Trichomonas: POSITIVE — AB

## 2021-12-27 NOTE — Progress Notes (Addendum)
Spoke w/ via phone for pre-op interview--- [facility] needs dos---- UPT  & EKG per anesthesia.Surgeon orders pending.            Lab results------ COVID test -----patient states asymptomatic no test needed Arrive at -------1100 NPO after MN NO Solid Food.  Clear liquids from MN until---1000 Med rec completed Medications to take morning of surgery -----NONE Diabetic medication ----- Patient instructed no nail polish to be worn day of surgery Patient instructed to bring photo id and insurance card day of surgery Patient aware to have Driver (ride ) / caregiver Wynelle Fanny   for 24 hours after surgery  Patient Special Instructions ----- Pre-Op special Istructions ----- Patient verbalized understanding of instructions that were given at this phone interview. Patient denies shortness of breath, chest pain, fever, cough at this phone interview.

## 2021-12-28 ENCOUNTER — Other Ambulatory Visit: Payer: Self-pay | Admitting: Emergency Medicine

## 2021-12-28 ENCOUNTER — Other Ambulatory Visit: Payer: Self-pay | Admitting: Obstetrics and Gynecology

## 2021-12-28 DIAGNOSIS — B3731 Acute candidiasis of vulva and vagina: Secondary | ICD-10-CM

## 2021-12-28 DIAGNOSIS — B9689 Other specified bacterial agents as the cause of diseases classified elsewhere: Secondary | ICD-10-CM

## 2021-12-28 MED ORDER — METRONIDAZOLE 500 MG PO TABS: 500.0000 mg | ORAL_TABLET | Freq: Two times a day (BID) | ORAL | 0 refills | Status: DC

## 2021-12-28 MED ORDER — TERCONAZOLE 0.8 % VA CREA: 1.0000 | TOPICAL_CREAM | Freq: Every day | VAGINAL | 0 refills | Status: DC

## 2021-12-28 NOTE — Progress Notes (Signed)
Pt informed of results.  Metronidazole for Trich.  Unable to seen Diflucan d/t allergy to red dye.  Terazol cream sent for yeast.

## 2021-12-28 NOTE — Progress Notes (Signed)
Resend of flagyl and terazol 3 due to insurance issues

## 2021-12-31 ENCOUNTER — Ambulatory Visit (INDEPENDENT_AMBULATORY_CARE_PROVIDER_SITE_OTHER): Payer: Medicaid Other | Admitting: Obstetrics and Gynecology

## 2021-12-31 VITALS — BP 133/95 | HR 94 | Wt 261.0 lb

## 2021-12-31 DIAGNOSIS — Z01818 Encounter for other preprocedural examination: Secondary | ICD-10-CM | POA: Diagnosis not present

## 2021-12-31 NOTE — Progress Notes (Signed)
Pt is in office for pre-op tubal visit .

## 2021-12-31 NOTE — Progress Notes (Signed)
Received a voicemail from Harless Litten, RN phone messages. Patient called in and asked to be called back. I reviewed patient's chart and noted that she had been prescribed meds for trich and yeast (flagyl and terazol) on Friday, 12/28/21. I added these medications to her list. I called the patient back, but only got her voicemail. I asked her to please give me a call today. Also, I instructed her that if her surgeon is not aware of the recent diagnosis and prescriptions to call them today and let her know since it could be an issue with her upcoming surgery. Awaiting a return phone call from patient.

## 2021-12-31 NOTE — Progress Notes (Signed)
OB/GYN Pre-Op History and Physical  Colleen Stone is a 26 y.o. [id number] presenting for preoperative visit. Pt desires permanent sterilization for her contraception. Patient desires bilateral tubal sterilization.  Other reversible forms of contraception were discussed with patient; she declines all other modalities. Husband declined vasectomy. Discussed bilateral tubal sterilization in detail; discussed laparoscopic bilateral tubal sterilization using Filshie clips.  Risks and benefits discussed in detail including but not limited to: risk of regret, permanence of method, bleeding, infection, injury to surrounding organs and need for additional procedures.  Failure risk of 1% for Filshie clips with increased risk of ectopic gestation if pregnancy occurs was also discussed with patient.   Patient verbalized understanding of these risks and benefits and wants to proceed with sterilization with laparoscopic bilateral sterilization.   S.  Routine postoperative instructions will be reviewed with the patient and her family in detail after surgery. Printed patient education handouts about the procedure was given to the patient to review at home.       Past Medical History:  Diagnosis Date   Anxiety    Asthma    Chlamydia    GERD (gastroesophageal reflux disease)    Gonorrhea    Headache    migraines   Hypertension    takes labetalol   MRSA infection    8th grade   Other acne 06/30/2008   Overview:  Acne   PCOS (polycystic ovarian syndrome)    Seizures (HCC)    Last seizure in this pregnancy not  sure when   Trichomonas infection     Past Surgical History:  Procedure Laterality Date   ADENOIDECTOMY     KNEE ARTHROSCOPY WITH ANTERIOR CRUCIATE LIGAMENT (ACL) REPAIR WITH HAMSTRING GRAFT Right 10/26/2021   Procedure: KNEE ARTHROSCOPY RECONSTRUCTION ANTERIOR CRUCIATE LIGAMENT (ACL) WITH QUADRICEPS ALLOGRAFT;  Surgeon: Yolonda Kida, MD;  Location: [facility];  Service: Orthopedics;   Laterality: Right;  120   MENISCUS REPAIR Right 10/26/2021   Procedure: REPAIR OF  MEDIAL MENISCUS;  Surgeon: Yolonda Kida, MD;  Location: [facility];  Service: Orthopedics;  Laterality: Right;  120   NASAL SEPTOPLASTY W/ TURBINOPLASTY     ROOT CANAL     TONSILLECTOMY     WISDOM TOOTH EXTRACTION      OB History  Gravida Para Term Preterm AB Living  3 2 2   1 2   SAB IAB Ectopic Multiple Live Births  1     0 2    # Outcome Date GA Lbr Len/2nd Weight Sex Delivery Anes PTL Lv  3 Term 04/17/21 [redacted]w[redacted]d / 00:03 6 lb 10.5 oz (3.02 kg) F Vag-Spont None  LIV  2 Term 07/05/18 [redacted]w[redacted]d / 00:08 7 lb 6.7 oz (3.365 kg) F Vag-Spont EPI  LIV  1 SAB 2019            Social History   Socioeconomic History   Marital status: Single    Spouse name: Not on file   Number of children: Not on file   Years of education: Not on file   Highest education level: Not on file  Occupational History   Not on file  Tobacco Use   Smoking status: Former    Types: Cigarettes    Quit date: 10/03/2017    Years since quitting: 4.2   Smokeless tobacco: Never  Vaping Use   Vaping Use: Former   Substances: Nicotine, Flavoring  Substance and Sexual Activity   Alcohol use: Yes   Drug use: Not Currently  Types: Marijuana    Comment: Last marijuana 09/08/20   Sexual activity: Yes    Partners: Male    Birth control/protection: Patch  Other Topics Concern   Not on file  Social History Narrative   Not on file   Social Determinants of Health   Financial Resource Strain: Low Risk  (06/22/2018)   Overall Financial Resource Strain (CARDIA)    Difficulty of Paying Living Expenses: Not hard at all  Food Insecurity: No Food Insecurity (12/01/2019)   Hunger Vital Sign    Worried About Running Out of Food in the Last Year: Never true    Ran Out of Food in the Last Year: Never true  Transportation Needs: No Transportation Needs (12/01/2019)   PRAPARE - Administrator, Civil Service (Medical): No    Lack  of Transportation (Non-Medical): No  Physical Activity: Not on file  Stress: No Stress Concern Present (06/22/2018)   [employer] of Occupational Health - Occupational Stress Questionnaire    Feeling of Stress : Not at all  Social Connections: Not on file    Family History  Problem Relation Age of Onset   Asthma Mother    Heart disease Mother        murmur   Hypertension Mother    Heart disease Father     (Not in a hospital admission)   Allergies  Allergen Reactions   Red Dye Nausea And Vomiting and Other (See Comments)    Her body rejects it   Latex Hives and Swelling    Review of Systems: Negative except for what is mentioned in HPI.     Physical Exam: BP (!) 133/95   Pulse 94   Wt 261 lb (118.4 kg)   LMP 12/13/2021 (Approximate)   BMI 44.80 kg/m  CONSTITUTIONAL: Well-developed, obese, well-nourished female in no acute distress.  HENT:  Normocephalic, atraumatic, External right and left ear normal. Oropharynx is clear and moist EYES: Conjunctivae and EOM are normal. Pupils are equal, round, and reactive to light. No scleral icterus.  NECK: Normal range of motion, supple, no masses SKIN: Skin is warm and dry. No rash noted. Not diaphoretic. No erythema. No pallor. NEUROLGIC: Alert and oriented to person, place, and time. Normal reflexes, muscle tone coordination. No cranial nerve deficit noted. PSYCHIATRIC: Normal mood and affect. Normal behavior. Normal judgment and thought content. CARDIOVASCULAR: Normal heart rate noted, regular rhythm RESPIRATORY: Effort and breath sounds normal, no problems with respiration noted ABDOMEN: Soft, nontender, nondistended PELVIC: Deferred MUSCULOSKELETAL: Normal range of motion. No edema and no tenderness. 2+ distal pulses.   Pertinent Labs/Studies:   No results found for this or any previous visit (from the past 72 hour(s)).     Assessment and Plan :Colleen Stone is a 26 y.o. [id number] here for preop history and  physical prior to the procedure.   Plan for laparoscopic bilateral salpingectomy NPO Admission labs ordered VS Q4 Discussed with patient risks, benefits of procedure along with expected recovery.  Also discussed with patient how increased abdominal girth may make the procedure more difficult.  Also reiterated that the procedure is considered permanent.   Mariel Aloe, M.D. Attending Obstetrician & Gynecologist, [facility] for [employer], [facility]

## 2021-12-31 NOTE — H&P (View-Only) (Signed)
OB/GYN Pre-Op History and Physical  Colleen Stone is a 26 y.o. [id number] presenting for preoperative visit. Pt desires permanent sterilization for her contraception. Patient desires bilateral tubal sterilization.  Other reversible forms of contraception were discussed with patient; she declines all other modalities. Husband declined vasectomy. Discussed bilateral tubal sterilization in detail; discussed laparoscopic bilateral tubal sterilization using Filshie clips.  Risks and benefits discussed in detail including but not limited to: risk of regret, permanence of method, bleeding, infection, injury to surrounding organs and need for additional procedures.  Failure risk of 1% for Filshie clips with increased risk of ectopic gestation if pregnancy occurs was also discussed with patient.   Patient verbalized understanding of these risks and benefits and wants to proceed with sterilization with laparoscopic bilateral sterilization.   S.  Routine postoperative instructions will be reviewed with the patient and her family in detail after surgery. Printed patient education handouts about the procedure was given to the patient to review at home.       Past Medical History:  Diagnosis Date   Anxiety    Asthma    Chlamydia    GERD (gastroesophageal reflux disease)    Gonorrhea    Headache    migraines   Hypertension    takes labetalol   MRSA infection    8th grade   Other acne 06/30/2008   Overview:  Acne   PCOS (polycystic ovarian syndrome)    Seizures (HCC)    Last seizure in this pregnancy not  sure when   Trichomonas infection     Past Surgical History:  Procedure Laterality Date   ADENOIDECTOMY     KNEE ARTHROSCOPY WITH ANTERIOR CRUCIATE LIGAMENT (ACL) REPAIR WITH HAMSTRING GRAFT Right 10/26/2021   Procedure: KNEE ARTHROSCOPY RECONSTRUCTION ANTERIOR CRUCIATE LIGAMENT (ACL) WITH QUADRICEPS ALLOGRAFT;  Surgeon: Yolonda Kida, MD;  Location: [facility];  Service: Orthopedics;   Laterality: Right;  120   MENISCUS REPAIR Right 10/26/2021   Procedure: REPAIR OF  MEDIAL MENISCUS;  Surgeon: Yolonda Kida, MD;  Location: [facility];  Service: Orthopedics;  Laterality: Right;  120   NASAL SEPTOPLASTY W/ TURBINOPLASTY     ROOT CANAL     TONSILLECTOMY     WISDOM TOOTH EXTRACTION      OB History  Gravida Para Term Preterm AB Living  3 2 2   1 2   SAB IAB Ectopic Multiple Live Births  1     0 2    # Outcome Date GA Lbr Len/2nd Weight Sex Delivery Anes PTL Lv  3 Term 04/17/21 [redacted]w[redacted]d / 00:03 6 lb 10.5 oz (3.02 kg) F Vag-Spont None  LIV  2 Term 07/05/18 [redacted]w[redacted]d / 00:08 7 lb 6.7 oz (3.365 kg) F Vag-Spont EPI  LIV  1 SAB 2019            Social History   Socioeconomic History   Marital status: Single    Spouse name: Not on file   Number of children: Not on file   Years of education: Not on file   Highest education level: Not on file  Occupational History   Not on file  Tobacco Use   Smoking status: Former    Types: Cigarettes    Quit date: 10/03/2017    Years since quitting: 4.2   Smokeless tobacco: Never  Vaping Use   Vaping Use: Former   Substances: Nicotine, Flavoring  Substance and Sexual Activity   Alcohol use: Yes   Drug use: Not Currently  Types: Marijuana    Comment: Last marijuana 09/08/20   Sexual activity: Yes    Partners: Male    Birth control/protection: Patch  Other Topics Concern   Not on file  Social History Narrative   Not on file   Social Determinants of Health   Financial Resource Strain: Low Risk  (06/22/2018)   Overall Financial Resource Strain (CARDIA)    Difficulty of Paying Living Expenses: Not hard at all  Food Insecurity: No Food Insecurity (12/01/2019)   Hunger Vital Sign    Worried About Running Out of Food in the Last Year: Never true    Ran Out of Food in the Last Year: Never true  Transportation Needs: No Transportation Needs (12/01/2019)   PRAPARE - Administrator, Civil Service (Medical): No    Lack  of Transportation (Non-Medical): No  Physical Activity: Not on file  Stress: No Stress Concern Present (06/22/2018)   [employer] of Occupational Health - Occupational Stress Questionnaire    Feeling of Stress : Not at all  Social Connections: Not on file    Family History  Problem Relation Age of Onset   Asthma Mother    Heart disease Mother        murmur   Hypertension Mother    Heart disease Father     (Not in a hospital admission)   Allergies  Allergen Reactions   Red Dye Nausea And Vomiting and Other (See Comments)    Her body rejects it   Latex Hives and Swelling    Review of Systems: Negative except for what is mentioned in HPI.     Physical Exam: BP (!) 133/95   Pulse 94   Wt 261 lb (118.4 kg)   LMP 12/13/2021 (Approximate)   BMI 44.80 kg/m  CONSTITUTIONAL: Well-developed, obese, well-nourished female in no acute distress.  HENT:  Normocephalic, atraumatic, External right and left ear normal. Oropharynx is clear and moist EYES: Conjunctivae and EOM are normal. Pupils are equal, round, and reactive to light. No scleral icterus.  NECK: Normal range of motion, supple, no masses SKIN: Skin is warm and dry. No rash noted. Not diaphoretic. No erythema. No pallor. NEUROLGIC: Alert and oriented to person, place, and time. Normal reflexes, muscle tone coordination. No cranial nerve deficit noted. PSYCHIATRIC: Normal mood and affect. Normal behavior. Normal judgment and thought content. CARDIOVASCULAR: Normal heart rate noted, regular rhythm RESPIRATORY: Effort and breath sounds normal, no problems with respiration noted ABDOMEN: Soft, nontender, nondistended PELVIC: Deferred MUSCULOSKELETAL: Normal range of motion. No edema and no tenderness. 2+ distal pulses.   Pertinent Labs/Studies:   No results found for this or any previous visit (from the past 72 hour(s)).     Assessment and Plan :Colleen Stone is a 26 y.o. [id number] here for preop history and  physical prior to the procedure.   Plan for laparoscopic bilateral salpingectomy NPO Admission labs ordered VS Q4 Discussed with patient risks, benefits of procedure along with expected recovery.  Also discussed with patient how increased abdominal girth may make the procedure more difficult.  Also reiterated that the procedure is considered permanent.   Mariel Aloe, M.D. Attending Obstetrician & Gynecologist, [facility] for [employer], [facility]

## 2022-01-02 ENCOUNTER — Other Ambulatory Visit: Payer: Self-pay

## 2022-01-02 ENCOUNTER — Encounter (HOSPITAL_BASED_OUTPATIENT_CLINIC_OR_DEPARTMENT_OTHER)
Admission: RE | Disposition: A | Discharge status: Home / Self Care | Payer: Self-pay | Source: Home / Self Care | Attending: Obstetrics and Gynecology

## 2022-01-02 ENCOUNTER — Ambulatory Visit (HOSPITAL_BASED_OUTPATIENT_CLINIC_OR_DEPARTMENT_OTHER)
Admission: RE | Admit: 2022-01-02 | Discharge: 2022-01-02 | Disposition: A | Discharge status: Home / Self Care | Payer: Medicaid Other | Attending: Obstetrics and Gynecology | Admitting: Obstetrics and Gynecology

## 2022-01-02 ENCOUNTER — Encounter (HOSPITAL_BASED_OUTPATIENT_CLINIC_OR_DEPARTMENT_OTHER): Payer: Self-pay | Admitting: Obstetrics and Gynecology

## 2022-01-02 ENCOUNTER — Ambulatory Visit (HOSPITAL_BASED_OUTPATIENT_CLINIC_OR_DEPARTMENT_OTHER): Payer: Medicaid Other | Admitting: Anesthesiology

## 2022-01-02 DIAGNOSIS — I10 Essential (primary) hypertension: Secondary | ICD-10-CM | POA: Insufficient documentation

## 2022-01-02 DIAGNOSIS — K219 Gastro-esophageal reflux disease without esophagitis: Secondary | ICD-10-CM | POA: Diagnosis not present

## 2022-01-02 DIAGNOSIS — Z6841 Body Mass Index (BMI) 40.0 and over, adult: Secondary | ICD-10-CM

## 2022-01-02 DIAGNOSIS — Z302 Encounter for sterilization: Secondary | ICD-10-CM | POA: Diagnosis present

## 2022-01-02 DIAGNOSIS — Z01818 Encounter for other preprocedural examination: Secondary | ICD-10-CM

## 2022-01-02 DIAGNOSIS — Z87891 Personal history of nicotine dependence: Secondary | ICD-10-CM | POA: Diagnosis not present

## 2022-01-02 DIAGNOSIS — J45909 Unspecified asthma, uncomplicated: Secondary | ICD-10-CM

## 2022-01-02 HISTORY — PX: LAPAROSCOPIC BILATERAL SALPINGECTOMY: SHX5889

## 2022-01-02 LAB — CBC
HCT: 40.9 % (ref 36.0–46.0)
Hemoglobin: 12.1 g/dL (ref 12.0–15.0)
MCH: 23.6 pg — ABNORMAL LOW (ref 26.0–34.0)
MCHC: 29.6 g/dL — ABNORMAL LOW (ref 30.0–36.0)
MCV: 79.7 fL — ABNORMAL LOW (ref 80.0–100.0)
Platelets: 312 10*3/uL (ref 150–400)
RBC: 5.13 MIL/uL — ABNORMAL HIGH (ref 3.87–5.11)
RDW: 17.9 % — ABNORMAL HIGH (ref 11.5–15.5)
WBC: 6.4 10*3/uL (ref 4.0–10.5)
nRBC: 0 % (ref 0.0–0.2)

## 2022-01-02 LAB — POCT PREGNANCY, URINE: Preg Test, Ur: NEGATIVE

## 2022-01-02 LAB — TYPE AND SCREEN
ABO/RH(D): A POS
Antibody Screen: NEGATIVE

## 2022-01-02 SURGERY — SALPINGECTOMY, BILATERAL, LAPAROSCOPIC
Anesthesia: General | Site: Abdomen | Laterality: Bilateral

## 2022-01-02 MED ORDER — HYDROMORPHONE HCL 1 MG/ML IJ SOLN
0.2500 mg | INTRAMUSCULAR | Status: DC | PRN
  Administered 2022-01-02 (×2): 0.25 mg via INTRAVENOUS

## 2022-01-02 MED ORDER — ROCURONIUM BROMIDE 10 MG/ML (PF) SYRINGE
PREFILLED_SYRINGE | INTRAVENOUS | Status: DC | PRN
  Administered 2022-01-02: 50 mg via INTRAVENOUS

## 2022-01-02 MED ORDER — PROPOFOL 10 MG/ML IV BOLUS
INTRAVENOUS | Status: AC
  Filled 2022-01-02: qty 20

## 2022-01-02 MED ORDER — BUPIVACAINE HCL 0.25 % IJ SOLN
INTRAMUSCULAR | Status: DC | PRN
  Administered 2022-01-02: 19 mL

## 2022-01-02 MED ORDER — OXYCODONE HCL 5 MG PO TABS: 5.0000 mg | ORAL_TABLET | Freq: Four times a day (QID) | ORAL | 0 refills | Status: AC | PRN

## 2022-01-02 MED ORDER — FENTANYL CITRATE (PF) 250 MCG/5ML IJ SOLN
INTRAMUSCULAR | Status: DC | PRN
  Administered 2022-01-02: 50 ug via INTRAVENOUS
  Administered 2022-01-02: 25 ug via INTRAVENOUS
  Administered 2022-01-02 (×2): 50 ug via INTRAVENOUS
  Administered 2022-01-02: 25 ug via INTRAVENOUS

## 2022-01-02 MED ORDER — POVIDONE-IODINE 10 % EX SWAB: 2.0000 | Freq: Once | CUTANEOUS | Status: DC

## 2022-01-02 MED ORDER — FENTANYL CITRATE (PF) 100 MCG/2ML IJ SOLN
INTRAMUSCULAR | Status: AC
  Filled 2022-01-02: qty 4

## 2022-01-02 MED ORDER — SUGAMMADEX SODIUM 500 MG/5ML IV SOLN
INTRAVENOUS | Status: AC
  Filled 2022-01-02: qty 5

## 2022-01-02 MED ORDER — CEFAZOLIN SODIUM-DEXTROSE 2-4 GM/100ML-% IV SOLN
INTRAVENOUS | Status: AC
  Filled 2022-01-02: qty 100

## 2022-01-02 MED ORDER — CEFAZOLIN IN SODIUM CHLORIDE 3-0.9 GM/100ML-% IV SOLN
3.0000 g | INTRAVENOUS | Status: AC
  Administered 2022-01-02: 3 g via INTRAVENOUS

## 2022-01-02 MED ORDER — ACETAMINOPHEN 500 MG PO TABS
1000.0000 mg | ORAL_TABLET | Freq: Once | ORAL | Status: AC
  Administered 2022-01-02: 1000 mg via ORAL

## 2022-01-02 MED ORDER — SOD CITRATE-CITRIC ACID 500-334 MG/5ML PO SOLN: 30.0000 mL | ORAL | Status: DC

## 2022-01-02 MED ORDER — HYDROMORPHONE HCL 1 MG/ML IJ SOLN
INTRAMUSCULAR | Status: AC
  Filled 2022-01-02: qty 1

## 2022-01-02 MED ORDER — LIDOCAINE 2% (20 MG/ML) 5 ML SYRINGE
INTRAMUSCULAR | Status: DC | PRN
  Administered 2022-01-02: 60 mg via INTRAVENOUS

## 2022-01-02 MED ORDER — ONDANSETRON HCL 4 MG/2ML IJ SOLN
INTRAMUSCULAR | Status: DC | PRN
  Administered 2022-01-02: 4 mg via INTRAVENOUS

## 2022-01-02 MED ORDER — IBUPROFEN 600 MG PO TABS: 600.0000 mg | ORAL_TABLET | Freq: Four times a day (QID) | ORAL | 1 refills | Status: DC | PRN

## 2022-01-02 MED ORDER — MIDAZOLAM HCL 2 MG/2ML IJ SOLN
INTRAMUSCULAR | Status: DC | PRN
  Administered 2022-01-02: 2 mg via INTRAVENOUS

## 2022-01-02 MED ORDER — SUGAMMADEX SODIUM 200 MG/2ML IV SOLN
INTRAVENOUS | Status: DC | PRN
  Administered 2022-01-02: 480 mg via INTRAVENOUS

## 2022-01-02 MED ORDER — DEXAMETHASONE SODIUM PHOSPHATE 10 MG/ML IJ SOLN
INTRAMUSCULAR | Status: DC | PRN
  Administered 2022-01-02: 10 mg via INTRAVENOUS

## 2022-01-02 MED ORDER — KETOROLAC TROMETHAMINE 30 MG/ML IJ SOLN
INTRAMUSCULAR | Status: DC | PRN
  Administered 2022-01-02: 30 mg via INTRAVENOUS

## 2022-01-02 MED ORDER — ACETAMINOPHEN 500 MG PO TABS
ORAL_TABLET | ORAL | Status: AC
  Filled 2022-01-02: qty 2

## 2022-01-02 MED ORDER — DIPHENHYDRAMINE HCL 50 MG/ML IJ SOLN
INTRAMUSCULAR | Status: DC | PRN
  Administered 2022-01-02: 12.5 mg via INTRAVENOUS

## 2022-01-02 MED ORDER — LACTATED RINGERS IV SOLN: INTRAVENOUS | Status: DC

## 2022-01-02 MED ORDER — 0.9 % SODIUM CHLORIDE (POUR BTL) OPTIME
TOPICAL | Status: DC | PRN
  Administered 2022-01-02: 500 mL

## 2022-01-02 MED ORDER — PROPOFOL 10 MG/ML IV BOLUS
INTRAVENOUS | Status: DC | PRN
  Administered 2022-01-02: 200 mg via INTRAVENOUS
  Administered 2022-01-02: 20 mg via INTRAVENOUS

## 2022-01-02 MED ORDER — DEXMEDETOMIDINE HCL IN NACL 80 MCG/20ML IV SOLN
INTRAVENOUS | Status: DC | PRN
  Administered 2022-01-02: 12 ug via BUCCAL

## 2022-01-02 MED ORDER — LIDOCAINE HCL (PF) 2 % IJ SOLN
INTRAMUSCULAR | Status: AC
  Filled 2022-01-02: qty 5

## 2022-01-02 MED ORDER — CEFAZOLIN IN SODIUM CHLORIDE 3-0.9 GM/100ML-% IV SOLN
INTRAVENOUS | Status: AC
  Filled 2022-01-02: qty 100

## 2022-01-02 MED ORDER — MIDAZOLAM HCL 2 MG/2ML IJ SOLN
INTRAMUSCULAR | Status: AC
  Filled 2022-01-02: qty 2

## 2022-01-02 MED ORDER — DEXMEDETOMIDINE HCL IN NACL 80 MCG/20ML IV SOLN
INTRAVENOUS | Status: AC
  Filled 2022-01-02: qty 20

## 2022-01-02 SURGICAL SUPPLY — 30 items
ADH SKN CLS APL DERMABOND .7 (GAUZE/BANDAGES/DRESSINGS) ×1
APL SRG 38 LTWT LNG FL B (MISCELLANEOUS)
APPLICATOR ARISTA FLEXITIP XL (MISCELLANEOUS) IMPLANT
DERMABOND ADVANCED .7 DNX12 (GAUZE/BANDAGES/DRESSINGS) ×1 IMPLANT
DRSG OPSITE POSTOP 3X4 (GAUZE/BANDAGES/DRESSINGS) ×1 IMPLANT
DURAPREP 26ML APPLICATOR (WOUND CARE) ×1 IMPLANT
GAUZE 4X4 16PLY ~~LOC~~+RFID DBL (SPONGE) ×1 IMPLANT
GLOVE BIOGEL PI IND STRL 8 (GLOVE) ×1 IMPLANT
GLOVE SURG ORTHO 8.0 STRL STRW (GLOVE) ×1 IMPLANT
GOWN STRL REUS W/TWL LRG LVL3 (GOWN DISPOSABLE) ×1 IMPLANT
GOWN STRL REUS W/TWL XL LVL3 (GOWN DISPOSABLE) ×1 IMPLANT
HEMOSTAT ARISTA ABSORB 3G PWDR (HEMOSTASIS) IMPLANT
KIT TURNOVER CYSTO (KITS) ×1 IMPLANT
LIGASURE VESSEL 5MM BLUNT TIP (ELECTROSURGICAL) ×1 IMPLANT
NS IRRIG 1000ML POUR BTL (IV SOLUTION) ×1 IMPLANT
PACK LAPAROSCOPY BASIN (CUSTOM PROCEDURE TRAY) ×1 IMPLANT
PACK TRENDGUARD 450 HYBRID PRO (MISCELLANEOUS) ×1 IMPLANT
SET IRRIG TUBING LAPAROSCOPIC (IRRIGATION / IRRIGATOR) IMPLANT
SET TUBE SMOKE EVAC HIGH FLOW (TUBING) ×1 IMPLANT
SLEEVE Z-THREAD 5X100MM (TROCAR) ×1 IMPLANT
SUT MNCRL AB 4-0 PS2 18 (SUTURE) ×1 IMPLANT
SUT VICRYL 0 UR6 27IN ABS (SUTURE) ×1 IMPLANT
SYS BAG RETRIEVAL 10MM (BASKET)
SYSTEM BAG RETRIEVAL 10MM (BASKET) IMPLANT
TOWEL OR 17X26 10 PK STRL BLUE (TOWEL DISPOSABLE) ×2 IMPLANT
TRAY FOLEY W/BAG SLVR 14FR (SET/KITS/TRAYS/PACK) ×1 IMPLANT
TRENDGUARD 450 HYBRID PRO PACK (MISCELLANEOUS)
TROCAR BALLN 12MMX100 BLUNT (TROCAR) ×1 IMPLANT
TROCAR Z-THREAD FIOS 5X100MM (TROCAR) ×1 IMPLANT
WARMER LAPAROSCOPE (MISCELLANEOUS) ×1 IMPLANT

## 2022-01-02 NOTE — Op Note (Signed)
Colleen Stone PROCEDURE DATE: 01/02/2022   PREOPERATIVE DIAGNOSIS:  Undesired fertility  POSTOPERATIVE DIAGNOSIS:  Undesired fertility  PROCEDURE:  Laparoscopic Bilateral Salpingectomy   SURGEON:  Dr. Mariel Aloe  ASSISTANT:  Dr. Rivka Barbara  . An experienced assistant was required given the standard of surgical care given the complexity of the case.  This assistant was needed for exposure, dissection, suctioning, retraction, instrument exchange, and for overall help during the procedure.  ANESTHESIA:  General endotracheal  COMPLICATIONS:  None immediate.  ESTIMATED BLOOD LOSS:  10 ml.  FLUIDS: 500 ml LR.  URINE OUTPUT:  30 ml of clear urine.  INDICATIONS: 26 y.o. [id number] with undesired fertility, desires permanent sterilization. Other reversible forms of contraception were discussed with patient; she declines all other modalities.  Risks of procedure discussed with patient including permanence of method, risk of regret, bleeding, infection, injury to surrounding organs and need for additional procedures including laparotomy.  Failure risk less than 0.5% with increased risk of ectopic gestation if pregnancy occurs was also discussed with patient.  Written informed consent was obtained.    FINDINGS:  Normal uterus, fallopian tubes, and ovaries. Normal liver and gallbladder, small focus of apparent endometriosis in the posterior cul de sac  TECHNIQUE:  The patient was taken to the operating room where general anesthesia was obtained without difficulty.  She was then placed in the dorsal lithotomy position and prepared and draped in sterile fashion.  After an adequate timeout was performed, a bivalved speculum was then placed in the patient's vagina, and the anterior lip of cervix grasped with the single-tooth tenaculum.  The Hulka uterine manipulator was then advanced into the uterus and secured.  The speculum was removed from the vagina as was the tenaculum.  A foley catheter was  also placed.  Attention was then turned to the patient's abdomen where a 6-mm skin incision was made in the umbilical fold.  The Optiview 5-mm trocar and sleeve were then advanced without difficulty with the laparoscope under direct visualization into the abdomen.  The abdomen was then insufflated with carbon dioxide gas.  Adequate pneumoperitoneum was obtained.  A survey of the patient's pelvis and abdomen revealed the findings above. Bilateral 5-mm lower quadrant ports were then placed under direct visualization.  The fallopian tubes were transected from the uterine attachments and the underlying mesosalpinx with the LigaSure device allowing for bilateral salpingectomy.  The fallopian tubes were then removed from the abdomen under direct visualization.  The operative site was surveyed, and it was found to be hemostatic.   No intraoperative injury to other surrounding organs was noted.  The abdomen was desufflated and all instruments were then removed from the patient's abdomen.  All skin incisions were closed with 4-0 Vicryl and Dermabond.  The uterine manipulator was removed from the cervix without complications. The patient tolerated the procedure well.  Sponge, lap, and needle counts were correct times two.  The patient was then taken to the recovery room awake, extubated and in stable condition.  The patient will be discharged to home as per PACU criteria.  Routine postoperative instructions given.  She was prescribed oxycodone and ibuprofen.  She will follow up in the clinic in 2-3 weeks for postoperative evaluation.   Mariel Aloe, MD, FACOG Attending Obstetrician & Gynecologist Faculty Practice, [facility] [place]

## 2022-01-02 NOTE — Discharge Instructions (Signed)

## 2022-01-02 NOTE — Interval H&P Note (Signed)
History and Physical Interval Note:  01/02/2022 12:38 PM  Colleen Stone  has presented today for surgery, with the diagnosis of Undesired Fertility.  The various methods of treatment have been discussed with the patient and family. After consideration of risks, benefits and other options for treatment, the patient has consented to  Procedure(s): LAPAROSCOPIC BILATERAL SALPINGECTOMY (Bilateral) as a surgical intervention.  The patient's history has been reviewed, patient examined, no change in status, stable for surgery.  I have reviewed the patient's chart and labs.  Questions were answered to the patient's satisfaction.     Warden Fillers

## 2022-01-02 NOTE — Anesthesia Postprocedure Evaluation (Signed)
Anesthesia Post Note  Patient: Colleen Stone  Procedure(s) Performed: LAPAROSCOPIC BILATERAL SALPINGECTOMY (Bilateral: Abdomen)     Patient location during evaluation: PACU Anesthesia Type: General Level of consciousness: awake and alert Pain management: pain level controlled Vital Signs Assessment: post-procedure vital signs reviewed and stable Respiratory status: spontaneous breathing, nonlabored ventilation and respiratory function stable Cardiovascular status: blood pressure returned to baseline and stable Postop Assessment: no apparent nausea or vomiting Anesthetic complications: no  No notable events documented.  Last Vitals:  Vitals:   01/02/22 1443 01/02/22 1445  BP: (!) 143/97 (!) 142/84  Pulse: 62 66  Resp: (!) 23 (!) 24  Temp:    SpO2: 100% 100%    Last Pain:  Vitals:   01/02/22 1443  TempSrc:   PainSc: 5                  Kamalani Mastro,W. EDMOND

## 2022-01-02 NOTE — Transfer of Care (Signed)
Immediate Anesthesia Transfer of Care Note  Patient: Tausha Done  Procedure(s) Performed: LAPAROSCOPIC BILATERAL SALPINGECTOMY (Bilateral: Abdomen)  Patient Location: PACU  Anesthesia Type:General  Level of Consciousness: drowsy and patient cooperative  Airway & Oxygen Therapy: Patient Spontanous Breathing  Post-op Assessment: Report given to RN and Post -op Vital signs reviewed and stable  Post vital signs: Reviewed and stable  Last Vitals:  Vitals Value Taken Time  BP 180/108 01/02/22 1407  Temp    Pulse 81 01/02/22 1413  Resp 15 01/02/22 1413  SpO2 100 % 01/02/22 1413  Vitals shown include unvalidated device data.  Last Pain:  Vitals:   01/02/22 1056  TempSrc: Oral      Patients Stated Pain Goal: 5 (01/02/22 1122)  Complications: No notable events documented.

## 2022-01-02 NOTE — Anesthesia Preprocedure Evaluation (Addendum)
Anesthesia Evaluation  Patient identified by MRN, date of birth, ID band Patient awake    Reviewed: Allergy & Precautions, H&P , NPO status , Patient's Chart, lab work & pertinent test results, reviewed documented beta blocker date and time   Airway Mallampati: I  TM Distance: >3 FB Neck ROM: Full    Dental no notable dental hx. (+) Teeth Intact, Dental Advisory Given   Pulmonary asthma , former smoker   Pulmonary exam normal breath sounds clear to auscultation       Cardiovascular hypertension, Pt. on medications and Pt. on home beta blockers  Rhythm:Regular Rate:Normal     Neuro/Psych  Headaches, Seizures -, Well Controlled,   Anxiety  Bipolar Disorder      GI/Hepatic Neg liver ROS,GERD  Medicated,,  Endo/Other    Morbid obesity  Renal/GU negative Renal ROS  negative genitourinary   Musculoskeletal   Abdominal   Peds  Hematology negative hematology ROS (+)   Anesthesia Other Findings   Reproductive/Obstetrics negative OB ROS                             Anesthesia Physical Anesthesia Plan  ASA: 3  Anesthesia Plan: General   Post-op Pain Management: Tylenol PO (pre-op)* and Toradol IV (intra-op)*   Induction: Intravenous  PONV Risk Score and Plan: 4 or greater and Ondansetron, Dexamethasone and Midazolam  Airway Management Planned: Oral ETT  Additional Equipment:   Intra-op Plan:   Post-operative Plan: Extubation in OR  Informed Consent: I have reviewed the patients History and Physical, chart, labs and discussed the procedure including the risks, benefits and alternatives for the proposed anesthesia with the patient or authorized representative who has indicated his/her understanding and acceptance.     Dental advisory given  Plan Discussed with: CRNA  Anesthesia Plan Comments:        Anesthesia Quick Evaluation

## 2022-01-02 NOTE — Anesthesia Procedure Notes (Signed)
Procedure Name: Intubation Date/Time: 01/02/2022 1:09 PM  Performed by: Clearnce Sorrel, CRNAPre-anesthesia Checklist: Patient identified, Emergency Drugs available, Suction available and Patient being monitored Patient Re-evaluated:Patient Re-evaluated prior to induction Oxygen Delivery Method: Circle System Utilized Preoxygenation: Pre-oxygenation with 100% oxygen Induction Type: IV induction Ventilation: Mask ventilation without difficulty Laryngoscope Size: Mac and 3 Grade View: Grade I Tube type: Oral Tube size: 7.0 mm Number of attempts: 1 Airway Equipment and Method: Stylet and Oral airway Placement Confirmation: ETT inserted through vocal cords under direct vision, positive ETCO2 and breath sounds checked- equal and bilateral Secured at: 22 cm Tube secured with: Tape Dental Injury: Teeth and Oropharynx as per pre-operative assessment

## 2022-01-03 LAB — SURGICAL PATHOLOGY

## 2022-01-04 ENCOUNTER — Encounter (HOSPITAL_BASED_OUTPATIENT_CLINIC_OR_DEPARTMENT_OTHER): Payer: Self-pay | Admitting: Obstetrics and Gynecology

## 2022-01-10 ENCOUNTER — Telehealth: Payer: Self-pay | Admitting: Emergency Medicine

## 2022-01-10 NOTE — Telephone Encounter (Signed)
TC from patient reporting leg swelling and visual change. Pt states BP has been high in the 160s. Is not taking BP meds since before surgery on 12/19. Per Donavan Foil, MD patient should resume BP meds as prescribed.  ED precautions given.

## 2022-01-23 ENCOUNTER — Encounter: Payer: Medicaid Other | Admitting: Obstetrics and Gynecology

## 2022-01-25 ENCOUNTER — Ambulatory Visit
Admission: RE | Admit: 2022-01-25 | Discharge: 2022-01-25 | Disposition: A | Discharge status: Home / Self Care | Payer: Medicaid Other | Source: Ambulatory Visit | Attending: Physician Assistant | Admitting: Physician Assistant

## 2022-01-25 VITALS — BP 171/95 | HR 78 | Temp 98.2°F | Resp 12

## 2022-01-25 DIAGNOSIS — Z113 Encounter for screening for infections with a predominantly sexual mode of transmission: Secondary | ICD-10-CM | POA: Diagnosis present

## 2022-01-25 DIAGNOSIS — N760 Acute vaginitis: Secondary | ICD-10-CM | POA: Insufficient documentation

## 2022-01-25 MED ORDER — FLUCONAZOLE 150 MG PO TABS: 150.0000 mg | ORAL_TABLET | Freq: Every day | ORAL | 1 refills | Status: AC

## 2022-01-25 MED ORDER — METRONIDAZOLE 250 MG PO TABS: 250.0000 mg | ORAL_TABLET | Freq: Three times a day (TID) | ORAL | 0 refills | Status: AC

## 2022-01-25 NOTE — Discharge Instructions (Signed)
Testing will be completed in 48 hours.  If you do not get a call from this office that indicates the test is negative.  Log onto MyChart to be the test results with post in 48 hours.  Advised to take the Diflucan 150 mg, 1 tablet initially and then may repeat in 3 to 4 days if needed.  There is a refill on this medication. Advised to take the Flagyl 250 mg 3 times daily until completed.  (Avoid any medications or exposure to alcohol as this will cause nausea and stomach upset when taking this medication and for 2 days after completion)  Advised follow-up PCP or return to urgent care as needed.

## 2022-01-25 NOTE — ED Provider Notes (Signed)
[name]    [name]: [id number] Arrival date & time: 01/25/22  1103      History   Chief Complaint Chief Complaint  Patient presents with   SEXUALLY TRANSMITTED DISEASE    I needed testing and treatment for sexual transmitted infections and bv - Entered by patient    HPI Colleen Stone is a 27 y.o. female.   27 year old female presents with vaginal discharge and for STI testing.  Patient indicates that for the past 2 days she has been having vaginal discharge which she describes as thick, white, with internal and external itching.  She also indicates that she is concerned about having BV as this usually occurs with her yeast infections.  Patient indicates she is not having any fever, chills, body aches or pain, frequency, urgency or dysuria.  Patient indicates she desires to have STI testing excluding HIV and RPR testing as she had this done recently.  She indicates her last period was December 20 just before she had bilateral ovarian tube ligation and removal.     Past Medical History:  Diagnosis Date   Anxiety    Asthma    Chlamydia    GERD (gastroesophageal reflux disease)    Gonorrhea    Headache    migraines   Hypertension    takes labetalol   MRSA infection    8th grade   Other acne 06/30/2008   Overview:  Acne   PCOS (polycystic ovarian syndrome)    Seizures (HCC)    Last seizure in this pregnancy not  sure when   Trichomonas infection     Patient Active Problem List   Diagnosis Date Noted   Yeast infection 09/19/2021   Encounter for tubal ligation 09/19/2021   BMI 45.0-49.9, adult (HCC) 12/26/2020   Alpha thalassemia silent carrier 11/28/2020   Hypertension    History of seizure disorder 06/15/2018   Bipolar 1 disorder (HCC) 01/13/2018   Family history of schizophrenia 01/13/2018   Smoker 01/13/2018   Atypical squamous cells of undetermined significance (ASCUS) on Papanicolaou smear of cervix 01/26/2013   Family history of breast cancer  08/31/2012    Past Surgical History:  Procedure Laterality Date   ADENOIDECTOMY     KNEE ARTHROSCOPY WITH ANTERIOR CRUCIATE LIGAMENT (ACL) REPAIR WITH HAMSTRING GRAFT Right 10/26/2021   Procedure: KNEE ARTHROSCOPY RECONSTRUCTION ANTERIOR CRUCIATE LIGAMENT (ACL) WITH QUADRICEPS ALLOGRAFT;  Surgeon: Yolonda Kida, MD;  Location: [facility];  Service: Orthopedics;  Laterality: Right;  120   LAPAROSCOPIC BILATERAL SALPINGECTOMY Bilateral 01/02/2022   Procedure: LAPAROSCOPIC BILATERAL SALPINGECTOMY;  Surgeon: Warden Fillers, MD;  Location: [facility];  Service: Gynecology;  Laterality: Bilateral;   MENISCUS REPAIR Right 10/26/2021   Procedure: REPAIR OF  MEDIAL MENISCUS;  Surgeon: Yolonda Kida, MD;  Location: [facility];  Service: Orthopedics;  Laterality: Right;  120   NASAL SEPTOPLASTY W/ TURBINOPLASTY     ROOT CANAL     TONSILLECTOMY     WISDOM TOOTH EXTRACTION      OB History     Gravida  3   Para  2   Term  2   Preterm      AB  1   Living  2      SAB  1   IAB      Ectopic      Multiple  0   Live Births  2            Home Medications  Prior to Admission medications   Medication Sig Start Date End Date Taking? Authorizing Provider  fluconazole (DIFLUCAN) 150 MG tablet Take 1 tablet (150 mg total) by mouth daily. May repeat in 3-4 days if needed. 01/25/22  Yes Nyoka Lint, PA-C  acetaminophen (TYLENOL) 500 MG tablet Take 2 tablets (1,000 mg total) by mouth every 8 (eight) hours as needed (pain). 04/19/21   Patriciaann Clan, DO  aspirin-acetaminophen-caffeine (EXCEDRIN MIGRAINE) [phone] MG tablet Take 1-2 tablets by mouth every 6 (six) hours as needed for headache.    [provider]  ibuprofen (ADVIL) 600 MG tablet Take 1 tablet (600 mg total) by mouth every 6 (six) hours as needed for headache, mild pain, moderate pain or cramping. 01/02/22   Griffin Basil, MD  metroNIDAZOLE (FLAGYL) 250 MG tablet Take 1 tablet (250 mg  total) by mouth 3 (three) times daily. Unsure of dosage. Patient prescribed on 12/28/21 for trich. 01/25/22   Nyoka Lint, PA-C  norelgestromin-ethinyl estradiol Marilu Favre) 150-35 MCG/24HR transdermal patch Place 1 patch onto the skin once a week. Patient taking differently: Place 1 patch onto the skin See admin instructions. Apply 1 patch weekly for 3 weeks, then off x 1 week--repeat 09/19/21   Chancy Milroy, MD  oxyCODONE (ROXICODONE) 5 MG immediate release tablet Take 1 tablet (5 mg total) by mouth every 6 (six) hours as needed for severe pain. 01/02/22   Griffin Basil, MD  terconazole (TERAZOL 3) 0.8 % vaginal cream Place 1 applicator vaginally at bedtime. Prescribed on 12/28/21, unsure of dosage.    [provider]  cetirizine (ZYRTEC ALLERGY) 10 MG tablet Take 1 tablet (10 mg total) by mouth 2 (two) times daily. 06/21/19 06/25/19  Muthersbaugh, Jarrett Soho, PA-C    Family History Family History  Problem Relation Age of Onset   Asthma Mother    Heart disease Mother        murmur   Hypertension Mother    Heart disease Father     Social History Social History   Tobacco Use   Smoking status: Former    Types: Cigarettes    Quit date: 10/03/2017    Years since quitting: 4.3   Smokeless tobacco: Never  Vaping Use   Vaping Use: Former   Substances: Nicotine, Flavoring  Substance Use Topics   Alcohol use: Yes   Drug use: Not Currently    Types: Marijuana    Comment: Last marijuana 09/08/20     Allergies   Red dye and Latex   Review of Systems Review of Systems  Genitourinary:  Positive for vaginal discharge (white and thick without odor.).     Physical Exam Triage Vital Signs ED Triage Vitals  Enc Vitals Group     BP 01/25/22 1111 (!) 171/95     Pulse Rate 01/25/22 1111 78     Resp 01/25/22 1111 12     Temp 01/25/22 1111 98.2 F (36.8 C)     Temp src --      SpO2 01/25/22 1111 98 %     Weight --      Height --      Head Circumference --      Peak Flow --       Pain Score 01/25/22 1115 0     Pain Loc --      Pain Edu? --      Excl. in [place]? --    No data found.  Updated Vital Signs BP (!) 171/95 (BP Location: Left Wrist)  Pulse 78   Temp 98.2 F (36.8 C)   Resp 12   LMP 01/02/2022 (Approximate)   SpO2 98%   Visual Acuity Right Eye Distance:   Left Eye Distance:   Bilateral Distance:    Right Eye Near:   Left Eye Near:    Bilateral Near:     Physical Exam Constitutional:      Appearance: Normal appearance.  Neurological:     Mental Status: She is alert.      UC Treatments / Results  Labs (all labs ordered are listed, but only abnormal results are displayed) Labs Reviewed  CERVICOVAGINAL ANCILLARY ONLY    EKG   Radiology No results found.  Procedures Procedures (including critical care time)  Medications Ordered in UC Medications - No data to display  Initial Impression / Assessment and Plan / UC Course  I have reviewed the triage vital signs and the nursing notes.  Pertinent labs & imaging results that were available during my care of the patient were reviewed by me and considered in my medical decision making (see chart for details).    Plan: 1.  The vaginitis will be treated with the following: A.  Diflucan 150 mg, 1 initially and then may repeat in 3 to 4 days if needed. B.  Flagyl 250 mg every 8 hours for 5 days only to treat BV. 2.  Screening for STI testing will be treated with the following: A.  Treatment may be considered depending on results of STI testing. 3.  Advised follow-up PCP or return to urgent care as needed. Final Clinical Impressions(s) / UC Diagnoses   Final diagnoses:  Vaginitis and vulvovaginitis  Routine screening for STI (sexually transmitted infection)     Discharge Instructions      Testing will be completed in 48 hours.  If you do not get a call from this office that indicates the test is negative.  Log onto MyChart to be the test results with post in 48  hours.  Advised to take the Diflucan 150 mg, 1 tablet initially and then may repeat in 3 to 4 days if needed.  There is a refill on this medication. Advised to take the Flagyl 250 mg 3 times daily until completed.  (Avoid any medications or exposure to alcohol as this will cause nausea and stomach upset when taking this medication and for 2 days after completion)  Advised follow-up PCP or return to urgent care as needed.    ED Prescriptions     Medication Sig Dispense Auth. Provider   metroNIDAZOLE (FLAGYL) 250 MG tablet Take 1 tablet (250 mg total) by mouth 3 (three) times daily. Unsure of dosage. Patient prescribed on 12/28/21 for trich. 15 tablet Nyoka Lint, PA-C   fluconazole (DIFLUCAN) 150 MG tablet Take 1 tablet (150 mg total) by mouth daily. May repeat in 3-4 days if needed. 2 tablet Nyoka Lint, PA-C      PDMP not reviewed this encounter.   Nyoka Lint, PA-C 01/25/22 1137

## 2022-01-25 NOTE — ED Triage Notes (Addendum)
Pt is here for vaginal irritation , discharge x 2days

## 2022-01-28 LAB — CERVICOVAGINAL ANCILLARY ONLY
Bacterial Vaginitis (gardnerella): NEGATIVE
Candida Glabrata: NEGATIVE
Candida Vaginitis: POSITIVE — AB
Chlamydia: NEGATIVE
Comment: NEGATIVE
Comment: NEGATIVE
Comment: NEGATIVE
Comment: NEGATIVE
Comment: NEGATIVE
Comment: NORMAL
Neisseria Gonorrhea: NEGATIVE
Trichomonas: POSITIVE — AB

## 2022-02-28 ENCOUNTER — Telehealth: Payer: Self-pay | Admitting: *Deleted

## 2022-02-28 NOTE — Telephone Encounter (Signed)
TC from pt reporting VB and cramping worse with cycle. Recent BTL. Discussed how VB and cramping after BTL can seem worse if pt was previously accustomed to cycles while on birth control. Offered provider appt to discuss management. Call transferred to schedulers.

## 2022-03-04 ENCOUNTER — Encounter: Payer: Self-pay | Admitting: Obstetrics and Gynecology

## 2022-03-04 ENCOUNTER — Telehealth (INDEPENDENT_AMBULATORY_CARE_PROVIDER_SITE_OTHER): Payer: Medicaid Other | Admitting: Obstetrics and Gynecology

## 2022-03-04 VITALS — Ht 64.5 in | Wt 252.0 lb

## 2022-03-04 DIAGNOSIS — N946 Dysmenorrhea, unspecified: Secondary | ICD-10-CM | POA: Insufficient documentation

## 2022-03-04 MED ORDER — IBUPROFEN 600 MG PO TABS: 600.0000 mg | ORAL_TABLET | Freq: Four times a day (QID) | ORAL | 1 refills | Status: AC | PRN

## 2022-03-04 NOTE — Progress Notes (Signed)
GYNECOLOGY VIRTUAL VISIT ENCOUNTER NOTE  Provider location: [facility] [place] [facility]   Patient location: Home  I connected with Colleen Stone on 03/04/22 at 10:35 AM EST by MyChart Video Encounter and verified that I am speaking with the correct person using two identifiers.   I discussed the limitations, risks, security and privacy concerns of performing an evaluation and management service virtually and the availability of in person appointments. I also discussed with the patient that there may be a patient responsible charge related to this service. The patient expressed understanding and agreed to proceed.   History:  Colleen Stone is a 27 y.o. [id number] female being evaluated today for dysmenorrhea. She denies any abnormal vaginal discharge, bleeding,  or other concerns.   Pt notes increased discomfort since her lap BTL.  She states that she has had increased pain and bleeding with her menses.  The increased cramping starts 1-2 days prior to her menses, but her menses only last about 2 days.  Discussed with patient the possibility of post tubal occlusion syndrome as the cause of increased discomfort.  Advised trial of ibuprofen 600 mg for pain control.  Pt advised to take the medication when the cramping begins and for the next 48-72 hours for pain control.     Past Medical History:  Diagnosis Date   Anxiety    Asthma    Chlamydia    GERD (gastroesophageal reflux disease)    Gonorrhea    Headache    migraines   Hypertension    takes labetalol   MRSA infection    8th grade   Other acne 06/30/2008   Overview:  Acne   PCOS (polycystic ovarian syndrome)    Seizures ([place])    Last seizure in this pregnancy not  sure when   Trichomonas infection    Past Surgical History:  Procedure Laterality Date   ADENOIDECTOMY     KNEE ARTHROSCOPY WITH ANTERIOR CRUCIATE LIGAMENT (ACL) REPAIR WITH HAMSTRING GRAFT Right 10/26/2021   Procedure: KNEE ARTHROSCOPY  RECONSTRUCTION ANTERIOR CRUCIATE LIGAMENT (ACL) WITH QUADRICEPS ALLOGRAFT;  Surgeon: Nicholes Stairs, MD;  Location: [facility];  Service: Orthopedics;  Laterality: Right;  120   LAPAROSCOPIC BILATERAL SALPINGECTOMY Bilateral 01/02/2022   Procedure: LAPAROSCOPIC BILATERAL SALPINGECTOMY;  Surgeon: Griffin Basil, MD;  Location[facility];  Service: Gynecology;  Laterality: Bilateral;   MENISCUS REPAIR Right 10/26/2021   Procedure: REPAIR OF  MEDIAL MENISCUS;  Surgeon: Nicholes Stairs, MD;  Location: [facility];  Service: Orthopedics;  Laterality: Right;  120   NASAL SEPTOPLASTY W/ TURBINOPLASTY     ROOT CANAL     TONSILLECTOMY     WISDOM TOOTH EXTRACTION     The following portions of the patient's history were reviewed and updated as appropriate: allergies, current medications, past family history, past medical history, past social history, past surgical history and problem list.   Health Maintenance:  Normal pap and negative HRHPV on 10/05/20.   Review of Systems:  Pertinent items noted in HPI and remainder of comprehensive ROS otherwise negative.  Physical Exam:   General:  Alert, oriented and cooperative. Patient appears to be in no acute distress.  Mental Status: Normal mood and affect. Normal behavior. Normal judgment and thought content.   Respiratory: Normal respiratory effort, no problems with respiration noted  Rest of physical exam deferred due to type of encounter  Labs and Imaging No results found for this or any previous visit (from the past 336  hour(s)). No results found.     Assessment and Plan:     1. 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  Trial of ibuprofent for pain control.  - ibuprofen (ADVIL) 600 MG tablet; Take 1 tablet (600 mg total) by mouth every 6 (six) hours as needed for headache, mild pain, moderate pain or cramping.  Dispense: 30 tablet; Refill: 1    Pt also advised she still needs TOC for trich.  Will schedule in  2-3 weeks for clearance.   I discussed the assessment and treatment plan with the patient. The patient was provided an opportunity to ask questions and all were answered. The patient agreed with the plan and demonstrated an understanding of the instructions.   The patient was advised to call back or seek an in-person evaluation/go to the ED if the symptoms worsen or if the condition fails to improve as anticipated.  I provided 10 minutes of face-to-face time during this encounter.   Griffin Basil, MD [facility] [employer], [place]

## 2022-03-04 NOTE — Progress Notes (Signed)
I connected with  Colleen Stone on 03/04/22 by a video enabled telemedicine application and verified that I am speaking with the correct person using two identifiers.   I discussed the limitations of evaluation and management by telemedicine. The patient expressed understanding and agreed to proceed.   MyChart GYN, c/o severe cramps 10/10 and heavy bleeding since 12/2021.  Needs TOC for Trich.

## 2022-03-19 ENCOUNTER — Telehealth: Payer: Self-pay

## 2022-03-19 NOTE — Telephone Encounter (Signed)
Patient called stating that she had her tubes removed on 12/20 and has gotten 6 positive pregnancy test at home. She states that her breast had been tender and she has been feeling nauseous. LMP 02/25/22 states that period lasted for 3 days and was very heavy.  Patient declines coming in for a pregnancy confirmation. Patient states that she has plans to terminate pregnancy.

## 2022-04-14 ENCOUNTER — Other Ambulatory Visit: Payer: Self-pay

## 2022-04-14 ENCOUNTER — Ambulatory Visit
Admission: RE | Admit: 2022-04-14 | Discharge: 2022-04-14 | Disposition: A | Discharge status: Home / Self Care | Payer: Medicaid Other | Source: Ambulatory Visit | Attending: Physician Assistant | Admitting: Physician Assistant

## 2022-04-14 VITALS — BP 143/89 | HR 83 | Temp 98.3°F | Resp 18

## 2022-04-14 DIAGNOSIS — H00014 Hordeolum externum left upper eyelid: Secondary | ICD-10-CM

## 2022-04-14 MED ORDER — TOBRAMYCIN 0.3 % OP SOLN: 1.0000 [drp] | OPHTHALMIC | 0 refills | Status: AC

## 2022-04-14 NOTE — ED Triage Notes (Signed)
Pt here for drainage and irritation to right eye x 3 days

## 2022-04-14 NOTE — Discharge Instructions (Signed)
Soak area 20 minutes 4 times a day °

## 2022-04-15 NOTE — ED Provider Notes (Signed)
[name]    [name]: [id number] Arrival date & time: 04/14/22  1340      History   Chief Complaint Chief Complaint  Patient presents with   Eye Problem    Eye swelling and pussing - Entered by patient    HPI Colleen Stone is a 27 y.o. female.   Patient complains of swelling to right upper eyelid.  Patient has a history of a stye on the same eyelid.  The history is provided by the patient. No language interpreter was used.  Eye Problem Location:  Left eye Onset quality:  Gradual Relieved by:  Nothing Worsened by:  Nothing Associated symptoms: no blurred vision and no double vision     Past Medical History:  Diagnosis Date   Anxiety    Asthma    Chlamydia    GERD (gastroesophageal reflux disease)    Gonorrhea    Headache    migraines   Hypertension    takes labetalol   MRSA infection    8th grade   Other acne 06/30/2008   Overview:  Acne   PCOS (polycystic ovarian syndrome)    Seizures ([place])    Last seizure in this pregnancy not  sure when   Trichomonas infection     Patient Active Problem List   Diagnosis Date Noted   Dysmenorrhea 03/04/2022   Yeast infection 09/19/2021   Encounter for tubal ligation 09/19/2021   BMI 45.0-49.9, adult 12/26/2020   Alpha thalassemia silent carrier 11/28/2020   Hypertension    History of seizure disorder 06/15/2018   Bipolar 1 disorder 01/13/2018   Family history of schizophrenia 01/13/2018   Smoker 01/13/2018   Atypical squamous cells of undetermined significance (ASCUS) on Papanicolaou smear of cervix 01/26/2013   Family history of breast cancer 08/31/2012    Past Surgical History:  Procedure Laterality Date   ADENOIDECTOMY     KNEE ARTHROSCOPY WITH ANTERIOR CRUCIATE LIGAMENT (ACL) REPAIR WITH HAMSTRING GRAFT Right 10/26/2021   Procedure: KNEE ARTHROSCOPY RECONSTRUCTION ANTERIOR CRUCIATE LIGAMENT (ACL) WITH QUADRICEPS ALLOGRAFT;  Surgeon: Nicholes Stairs, MD;  Location: [facility];  Service:  Orthopedics;  Laterality: Right;  120   LAPAROSCOPIC BILATERAL SALPINGECTOMY Bilateral 01/02/2022   Procedure: LAPAROSCOPIC BILATERAL SALPINGECTOMY;  Surgeon: Griffin Basil, MD;  Location: [facility];  Service: Gynecology;  Laterality: Bilateral;   MENISCUS REPAIR Right 10/26/2021   Procedure: REPAIR OF  MEDIAL MENISCUS;  Surgeon: Nicholes Stairs, MD;  Location: [facility];  Service: Orthopedics;  Laterality: Right;  120   NASAL SEPTOPLASTY W/ TURBINOPLASTY     ROOT CANAL     TONSILLECTOMY     WISDOM TOOTH EXTRACTION      OB History     Gravida  3   Para  2   Term  2   Preterm      AB  1   Living  2      SAB  1   IAB      Ectopic      Multiple  0   Live Births  2            Home Medications    Prior to Admission medications   Medication Sig Start Date End Date Taking? Authorizing Provider  tobramycin (TOBREX) 0.3 % ophthalmic solution Place 1 drop into the right eye every 4 (four) hours for 10 days. 04/14/22 04/24/22 Yes Fransico Meadow, PA-C  acetaminophen (TYLENOL) 500 MG tablet Take 2 tablets (1,000 mg total) by mouth  every 8 (eight) hours as needed (pain). 04/19/21   Patriciaann Clan, DO  aspirin-acetaminophen-caffeine (EXCEDRIN MIGRAINE) [phone] MG tablet Take 1-2 tablets by mouth every 6 (six) hours as needed for headache.    [provider]  fluconazole (DIFLUCAN) 150 MG tablet Take 1 tablet (150 mg total) by mouth daily. May repeat in 3-4 days if needed. 01/25/22   Nyoka Lint, PA-C  ibuprofen (ADVIL) 600 MG tablet Take 1 tablet (600 mg total) by mouth every 6 (six) hours as needed for headache, mild pain, moderate pain or cramping. 03/04/22   Griffin Basil, MD  metroNIDAZOLE (FLAGYL) 250 MG tablet Take 1 tablet (250 mg total) by mouth 3 (three) times daily. Unsure of dosage. Patient prescribed on 12/28/21 for trich. 01/25/22   Nyoka Lint, PA-C  norelgestromin-ethinyl estradiol Marilu Favre) 150-35 MCG/24HR transdermal patch  Place 1 patch onto the skin once a week. Patient taking differently: Place 1 patch onto the skin See admin instructions. Apply 1 patch weekly for 3 weeks, then off x 1 week--repeat 09/19/21   Chancy Milroy, MD  oxyCODONE (ROXICODONE) 5 MG immediate release tablet Take 1 tablet (5 mg total) by mouth every 6 (six) hours as needed for severe pain. 01/02/22   Griffin Basil, MD  terconazole (TERAZOL 3) 0.8 % vaginal cream Place 1 applicator vaginally at bedtime. Prescribed on 12/28/21, unsure of dosage.    [provider]  cetirizine (ZYRTEC ALLERGY) 10 MG tablet Take 1 tablet (10 mg total) by mouth 2 (two) times daily. 06/21/19 06/25/19  Muthersbaugh, Jarrett Soho, PA-C    Family History Family History  Problem Relation Age of Onset   Asthma Mother    Heart disease Mother        murmur   Hypertension Mother    Heart disease Father     Social History Social History   Tobacco Use   Smoking status: Former    Types: Cigarettes    Quit date: 10/03/2017    Years since quitting: 4.5   Smokeless tobacco: Never  Vaping Use   Vaping Use: Former   Substances: Nicotine, Flavoring  Substance Use Topics   Alcohol use: Yes   Drug use: Not Currently    Types: Marijuana    Comment: Last marijuana 09/08/20     Allergies   Red dye and Latex   Review of Systems Review of Systems  Eyes:  Negative for blurred vision and double vision.  All other systems reviewed and are negative.    Physical Exam Triage Vital Signs ED Triage Vitals  Enc Vitals Group     BP 04/14/22 1406 (!) 143/89     Pulse Rate 04/14/22 1406 83     Resp 04/14/22 1406 18     Temp 04/14/22 1406 98.3 F (36.8 C)     Temp Source 04/14/22 1406 Oral     SpO2 04/14/22 1406 99 %     Weight --      Height --      Head Circumference --      Peak Flow --      Pain Score 04/14/22 1407 0     Pain Loc --      Pain Edu? --      Excl. in [place]? --    No data found.  Updated Vital Signs BP (!) 143/89 (BP Location: Left  Arm)   Pulse 83   Temp 98.3 F (36.8 C) (Oral)   Resp 18   SpO2 99%   Visual  Acuity Right Eye Distance:   Left Eye Distance:   Bilateral Distance:    Right Eye Near:   Left Eye Near:    Bilateral Near:     Physical Exam Vitals and nursing note reviewed.  Constitutional:      Appearance: She is well-developed.  HENT:     Head: Normocephalic.  Eyes:     Extraocular Movements: Extraocular movements intact.     Pupils: Pupils are equal, round, and reactive to light.     Comments: Swelling upper right eyelid  Pulmonary:     Effort: Pulmonary effort is normal.  Abdominal:     General: There is no distension.  Musculoskeletal:        General: Normal range of motion.     Cervical back: Normal range of motion.  Neurological:     General: No focal deficit present.     Mental Status: She is alert and oriented to person, place, and time.  Psychiatric:        Mood and Affect: Mood normal.      UC Treatments / Results  Labs (all labs ordered are listed, but only abnormal results are displayed) Labs Reviewed - No data to display  EKG   Radiology No results found.  Procedures Procedures (including critical care time)  Medications Ordered in UC Medications - No data to display  Initial Impression / Assessment and Plan / UC Course  I have reviewed the triage vital signs and the nursing notes.  Pertinent labs & imaging results that were available during my care of the patient were reviewed by me and considered in my medical decision making (see chart for details).      Final Clinical Impressions(s) / UC Diagnoses   Final diagnoses:  Hordeolum externum of left upper eyelid     Discharge Instructions      Soak area 20 minutes 4 times a day   ED Prescriptions     Medication Sig Dispense Auth. Provider   tobramycin (TOBREX) 0.3 % ophthalmic solution Place 1 drop into the right eye every 4 (four) hours for 10 days. 5 mL Fransico Meadow, [place]      PDMP  not reviewed this encounter. An After Visit Summary was printed and given to the patient.       Fransico Meadow, [place] 04/15/22 [phone]

## 2022-04-16 ENCOUNTER — Telehealth: Payer: Self-pay

## 2022-04-16 NOTE — Telephone Encounter (Signed)
Patient called stating that she has been bleeding for 2 weeks. Patient states that she just started taking the Slynd ocp last week. Patient advised to give ocp a little more time to help regulate her bleeding. Patient verbalized understanding.

## 2022-05-09 ENCOUNTER — Encounter (HOSPITAL_COMMUNITY): Payer: Self-pay

## 2022-05-09 ENCOUNTER — Emergency Department (HOSPITAL_COMMUNITY)
Admission: EM | Admit: 2022-05-09 | Discharge: 2022-05-10 | Disposition: A | Discharge status: Home / Self Care | Payer: Medicaid Other | Attending: Emergency Medicine | Admitting: Emergency Medicine

## 2022-05-09 DIAGNOSIS — Z91040 Latex allergy status: Secondary | ICD-10-CM | POA: Insufficient documentation

## 2022-05-09 DIAGNOSIS — Z7982 Long term (current) use of aspirin: Secondary | ICD-10-CM | POA: Insufficient documentation

## 2022-05-09 DIAGNOSIS — J029 Acute pharyngitis, unspecified: Secondary | ICD-10-CM | POA: Diagnosis present

## 2022-05-09 LAB — GROUP A STREP BY PCR: Group A Strep by PCR: NOT DETECTED

## 2022-05-09 NOTE — ED Triage Notes (Signed)
Pt arrived POV for sore throat and left ear pain, reports "knot to back of throat" pt has had tonsillectomy at age 27. No other s/s, VSS, NAD noted

## 2022-05-10 MED ORDER — FLUTICASONE PROPIONATE 50 MCG/ACT NA SUSP: 1.0000 | Freq: Every day | NASAL | 2 refills | Status: AC

## 2022-05-10 MED ORDER — DEXAMETHASONE 4 MG PO TABS
6.0000 mg | ORAL_TABLET | Freq: Once | ORAL | Status: AC
  Administered 2022-05-10: 6 mg via ORAL
  Filled 2022-05-10: qty 1

## 2022-05-10 MED ORDER — MONTELUKAST SODIUM 10 MG PO TABS: 10.0000 mg | ORAL_TABLET | Freq: Every day | ORAL | 0 refills | Status: AC

## 2022-05-10 NOTE — Discharge Instructions (Signed)
Singulair and Flonase daily. Recheck with your doctor as needed.

## 2022-05-10 NOTE — ED Provider Notes (Signed)
[facility] AT [facility] Provider Note   [name]: [id number] Arrival date & time: 05/09/22  2220     History  Chief Complaint  Patient presents with   Sore Throat    Colleen Stone is a 27 y.o. female.  27 year old female with complaint of 1 week of left side sore throat and ear pain. Exposed to her infant who has had URI symptoms for the past few weeks and was diagnosed with a sinus infection yesterday. Notes history of tonsillectomy, has noted two red spots on the left side of her throat, unsure if related to her pain today. Taking NyQuil for her symptoms without resolution.   Tubaligation, denies possibility of pregnancy        Home Medications Prior to Admission medications   Medication Sig Start Date End Date Taking? Authorizing Provider  fluticasone (FLONASE) 50 MCG/ACT nasal spray Place 1 spray into both nostrils daily. 05/10/22  Yes Jeannie Fend, PA-C  montelukast (SINGULAIR) 10 MG tablet Take 1 tablet (10 mg total) by mouth at bedtime. 05/10/22  Yes Jeannie Fend, PA-C  acetaminophen (TYLENOL) 500 MG tablet Take 2 tablets (1,000 mg total) by mouth every 8 (eight) hours as needed (pain). 04/19/21   Allayne Stack, DO  aspirin-acetaminophen-caffeine (EXCEDRIN MIGRAINE) [phone] MG tablet Take 1-2 tablets by mouth every 6 (six) hours as needed for headache.    [provider]  fluconazole (DIFLUCAN) 150 MG tablet Take 1 tablet (150 mg total) by mouth daily. May repeat in 3-4 days if needed. 01/25/22   Ellsworth Lennox, PA-C  ibuprofen (ADVIL) 600 MG tablet Take 1 tablet (600 mg total) by mouth every 6 (six) hours as needed for headache, mild pain, moderate pain or cramping. 03/04/22   Warden Fillers, MD  metroNIDAZOLE (FLAGYL) 250 MG tablet Take 1 tablet (250 mg total) by mouth 3 (three) times daily. Unsure of dosage. Patient prescribed on 12/28/21 for trich. 01/25/22   Ellsworth Lennox, PA-C  norelgestromin-ethinyl estradiol [facility])  150-35 MCG/24HR transdermal patch Place 1 patch onto the skin once a week. Patient taking differently: Place 1 patch onto the skin See admin instructions. Apply 1 patch weekly for 3 weeks, then off x 1 week--repeat 09/19/21   Hermina Staggers, MD  oxyCODONE (ROXICODONE) 5 MG immediate release tablet Take 1 tablet (5 mg total) by mouth every 6 (six) hours as needed for severe pain. 01/02/22   Warden Fillers, MD  terconazole (TERAZOL 3) 0.8 % vaginal cream Place 1 applicator vaginally at bedtime. Prescribed on 12/28/21, unsure of dosage.    [provider]  cetirizine (ZYRTEC ALLERGY) 10 MG tablet Take 1 tablet (10 mg total) by mouth 2 (two) times daily. 06/21/19 06/25/19  Muthersbaugh, Dahlia Client, PA-C      Allergies    Red dye and Latex    Review of Systems   Review of Systems Negative except as per HPI Physical Exam Updated Vital Signs BP (!) 130/94 (BP Location: Left Arm)   Pulse 98   Temp 98.2 F (36.8 C) (Oral)   Resp 16   Ht 5' 4.5" (1.638 m)   Wt 104.3 kg   SpO2 99%   BMI 38.87 kg/m  Physical Exam Vitals and nursing note reviewed.  Constitutional:      General: She is not in acute distress.    Appearance: She is well-developed. She is not diaphoretic.  HENT:     Head: Normocephalic and atraumatic.     Right  Ear: Tympanic membrane and ear canal normal.     Left Ear: Tympanic membrane and ear canal normal.     Nose: Congestion present.     Mouth/Throat:     Mouth: Mucous membranes are moist.     Pharynx: Uvula midline. Posterior oropharyngeal erythema present. No pharyngeal swelling, oropharyngeal exudate or uvula swelling.     Tonsils: No tonsillar exudate or tonsillar abscesses. 0 on the right. 0 on the left.     Comments: Cobblestone appearance to posterior oropharynx  Eyes:     Conjunctiva/sclera: Conjunctivae normal.  Cardiovascular:     Rate and Rhythm: Normal rate and regular rhythm.     Heart sounds: Normal heart sounds.  Pulmonary:     Effort: Pulmonary  effort is normal.     Breath sounds: Normal breath sounds.  Musculoskeletal:     Cervical back: Neck supple.  Lymphadenopathy:     Cervical: No cervical adenopathy.  Skin:    General: Skin is warm and dry.  Neurological:     Mental Status: She is alert and oriented to person, place, and time.  Psychiatric:        Behavior: Behavior normal.     ED Results / Procedures / Treatments   Labs (all labs ordered are listed, but only abnormal results are displayed) Labs Reviewed  GROUP A STREP BY PCR    EKG None  Radiology No results found.  Procedures Procedures    Medications Ordered in ED Medications  dexamethasone (DECADRON) tablet 6 mg (has no administration in time range)    ED Course/ Medical Decision Making/ A&P                             Medical Decision Making Risk Prescription drug management.   27 yo female with symptoms as above. On exam, well appearing, non toxic, in no distress. Mild nasal congestion, cobble stone appearance to posterior oropharynx. Tms normal, no cervical Lns.  Strep negative. Suspect viral vs allergic source for patient's symptoms.  Recommend drinking plenty of fluids. Decadron in ER tonight, hopefully will provide some relief. Singulair and Flonase as prescribed.         Final Clinical Impression(s) / ED Diagnoses Final diagnoses:  Pharyngitis, unspecified etiology    Rx / DC Orders ED Discharge Orders          Ordered    montelukast (SINGULAIR) 10 MG tablet  Daily at bedtime        05/10/22 0014    fluticasone (FLONASE) 50 MCG/ACT nasal spray  Daily        05/10/22 0014              Jeannie Fend, PA-C 05/10/22 0032    Melene Plan, DO 05/10/22 0100

## 2022-05-17 ENCOUNTER — Other Ambulatory Visit: Payer: Self-pay | Admitting: Medical

## 2022-05-17 DIAGNOSIS — Z3009 Encounter for other general counseling and advice on contraception: Secondary | ICD-10-CM

## 2023-01-06 IMAGING — US US ABDOMEN LIMITED
1 series · 14 of 25 positions shown · non-contrast
Comparison: None.

CLINICAL DATA: Epigastric pain

EXAM:
ULTRASOUND ABDOMEN LIMITED RIGHT UPPER QUADRANT

[Series 1: us abdomen limited ruq (liver/gb) · 14 of 52 slices shown]
[im 1/52]
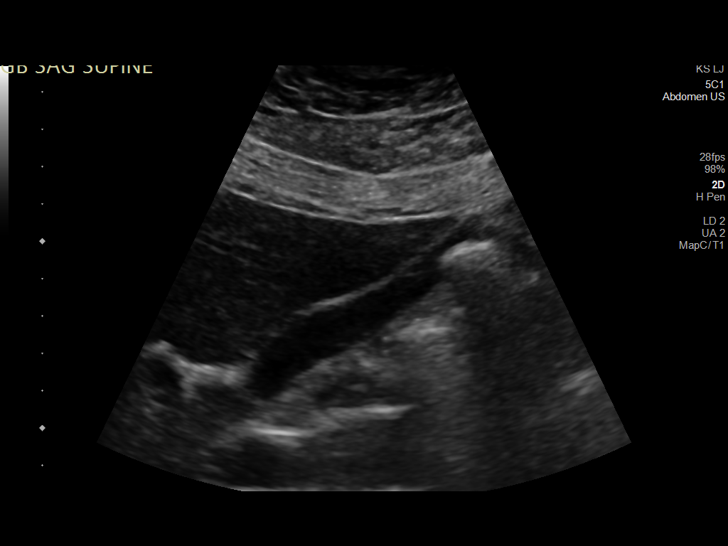
[im 5/52]
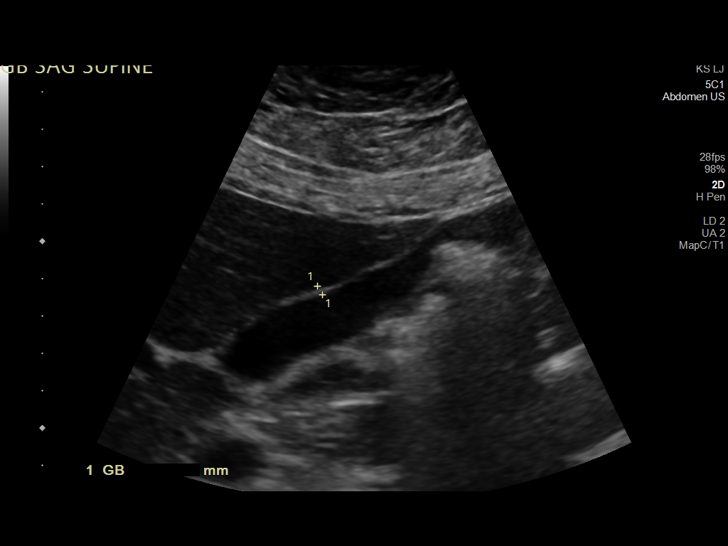
[im 9/52]
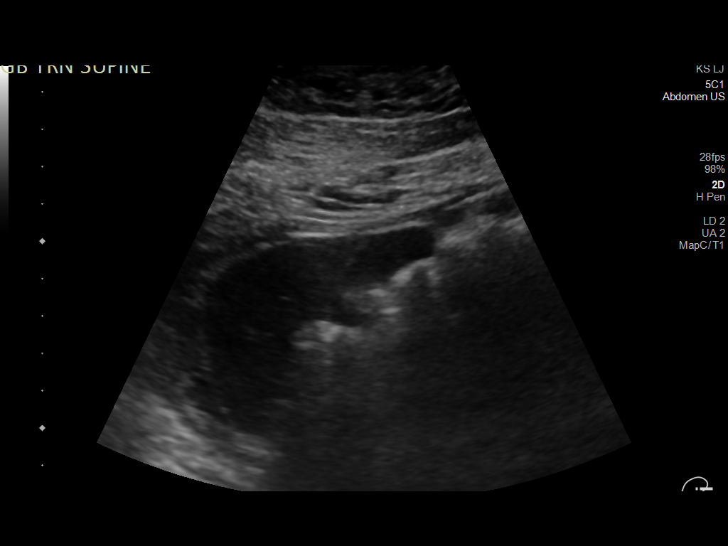
[im 13/52]
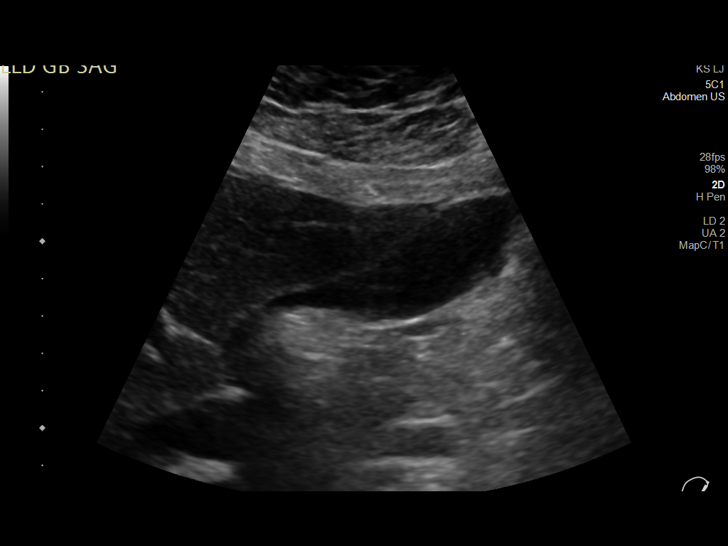
[im 18/52]
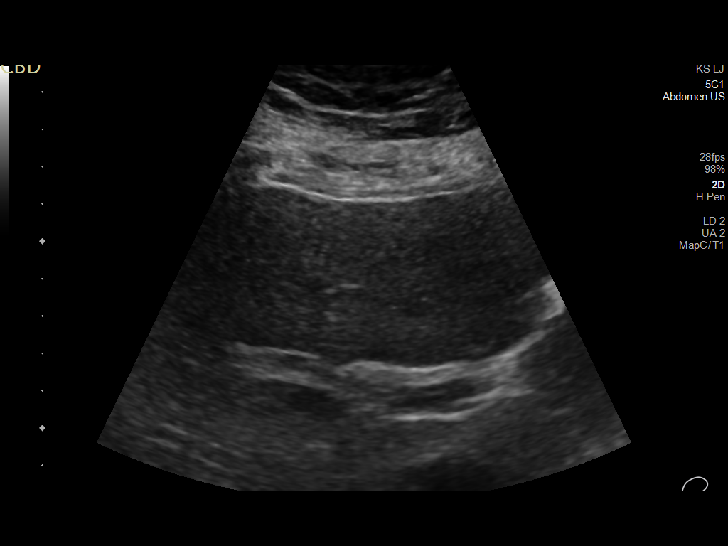
[im 20/52]
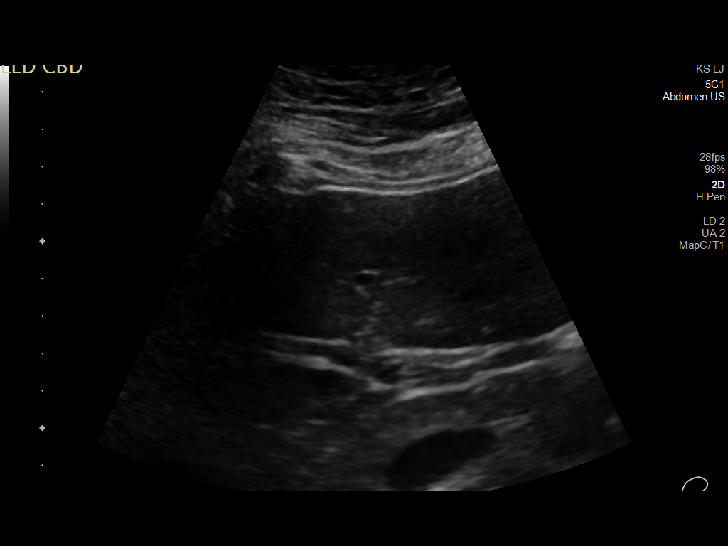
[im 24/52]
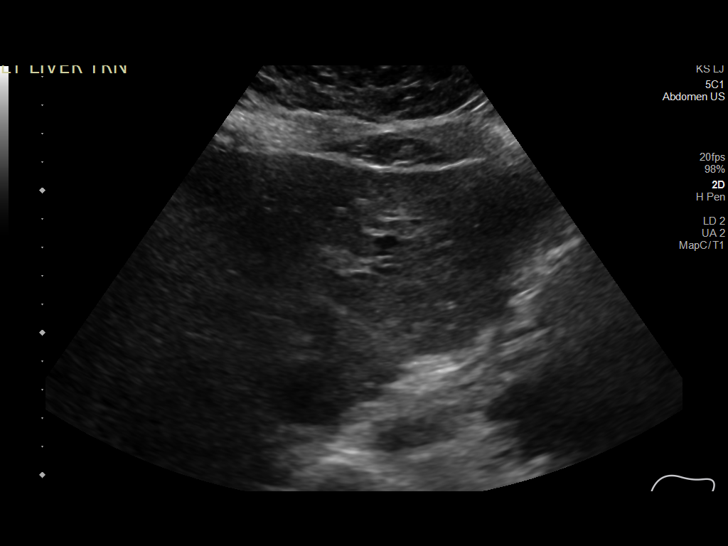
[im 28/52]
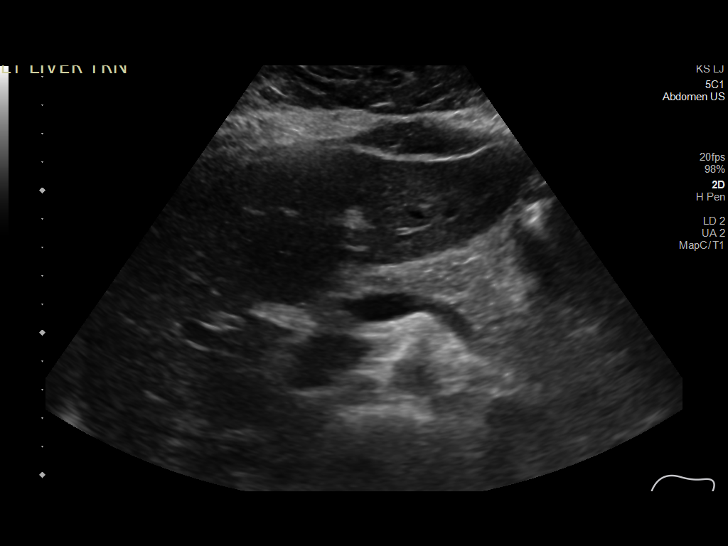
[im 32/52]
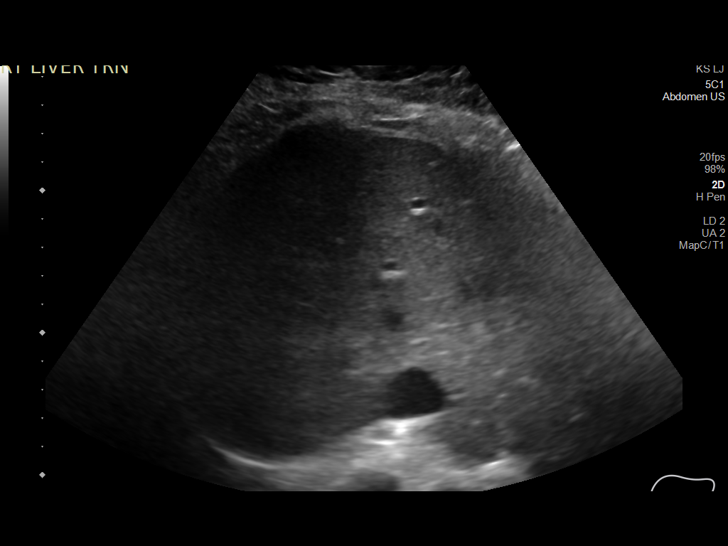
[im 35/52]
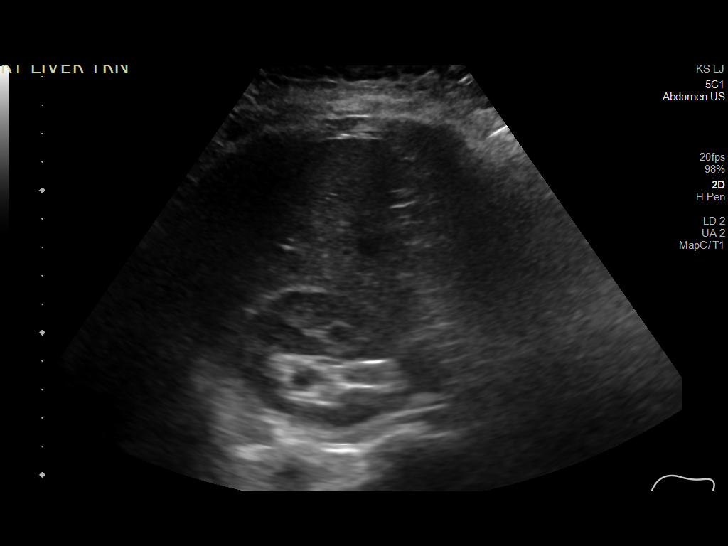
[im 39/52]
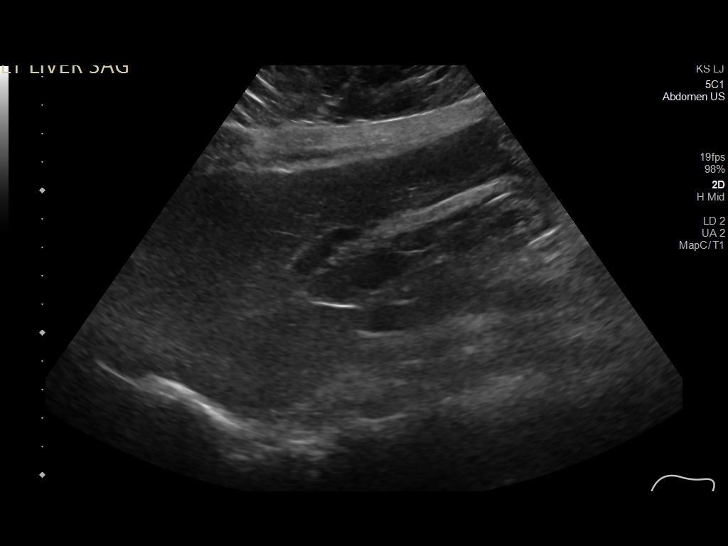
[im 43/52]
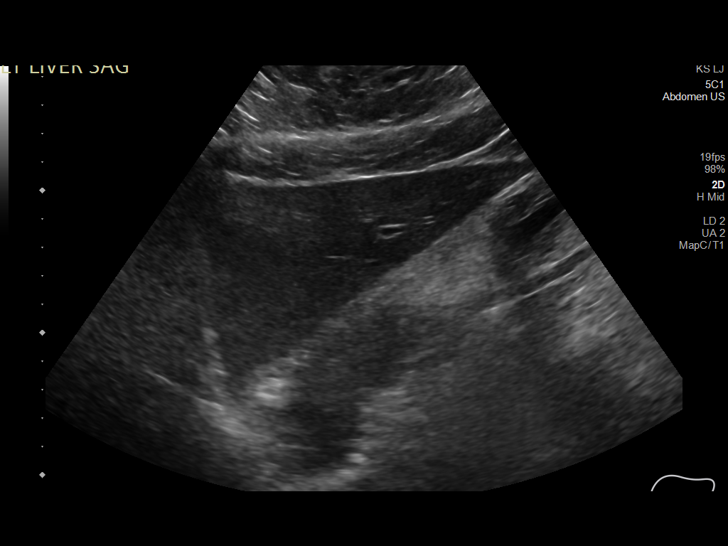
[im 47/52]
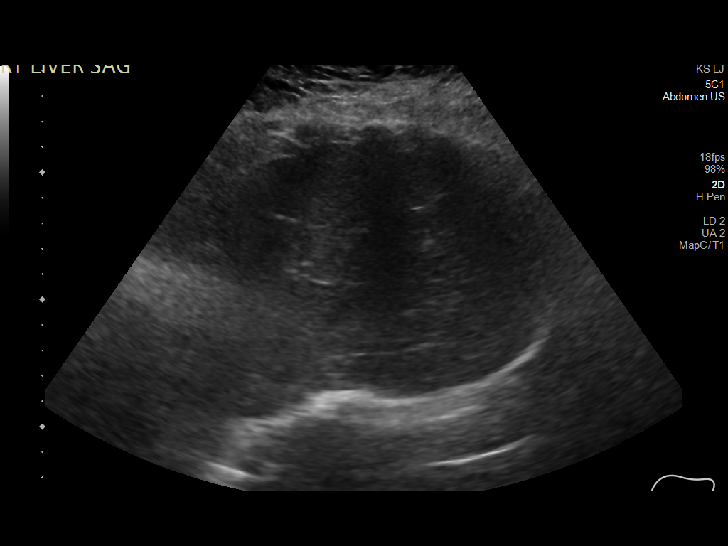
[im 52/52]
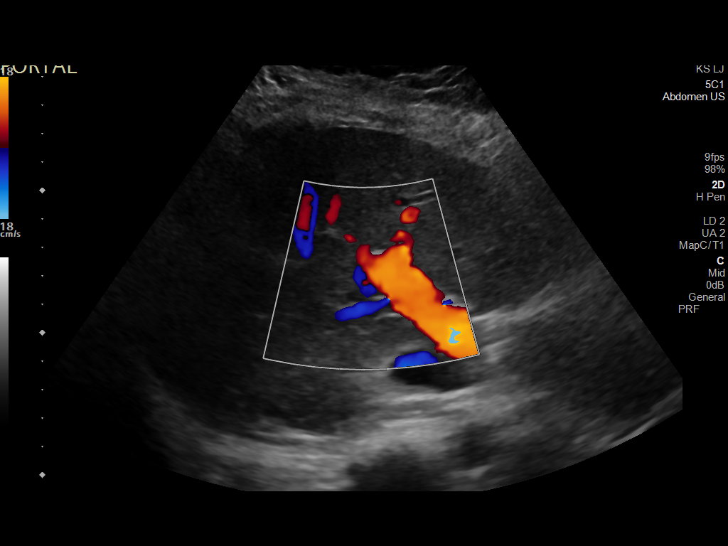

[14 of 25 positions shown; findings below may reference images not displayed]

FINDINGS: Gallbladder:

No gallstones or wall thickening visualized. No sonographic Murphy
sign noted by sonographer.

Common bile duct:

Limited visualization.  3 mm diameter.

Liver:

No focal lesion identified. Within normal limits in parenchymal
echogenicity. Portal vein is patent on color Doppler imaging with
normal direction of blood flow towards the liver.
IMPRESSION: Normal right upper quadrant ultrasound.

## 2023-10-07 IMAGING — US US MFM FETAL BPP W/O NON-STRESS
1 series · 13 of 28 positions shown · non-contrast
Comparison: none

[Series 1: us mfm fetal bpp w/o non-stress · 38 acquisitions, 13 frames shown]
[im 2/38]
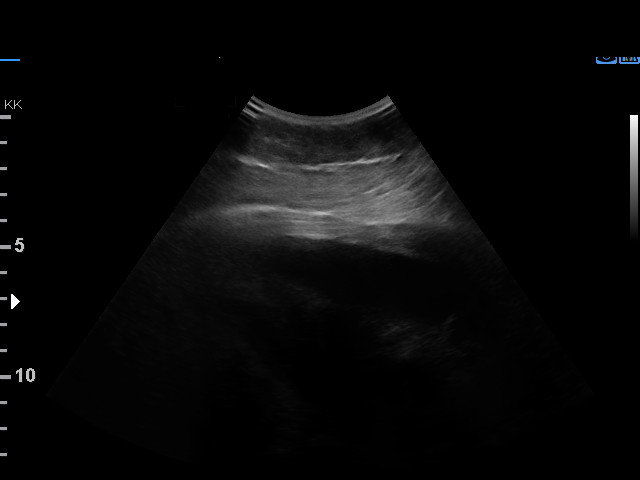
[im 5/38]
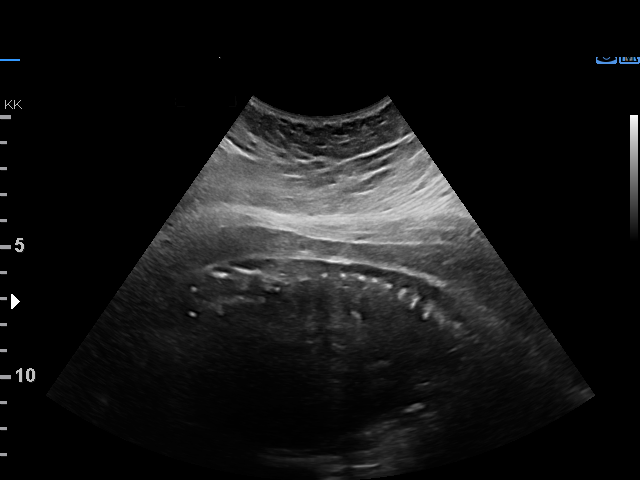
[im 7/38]
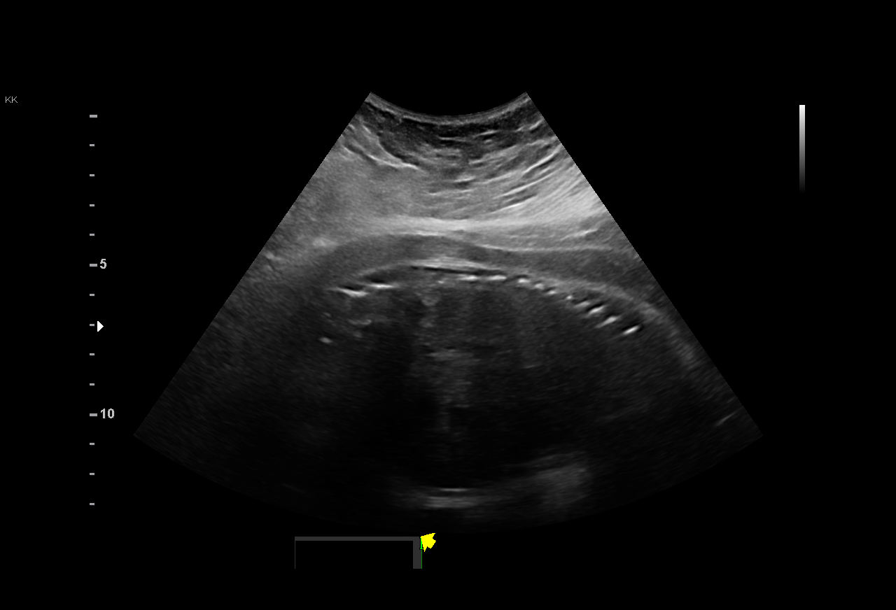
[im 10/38]
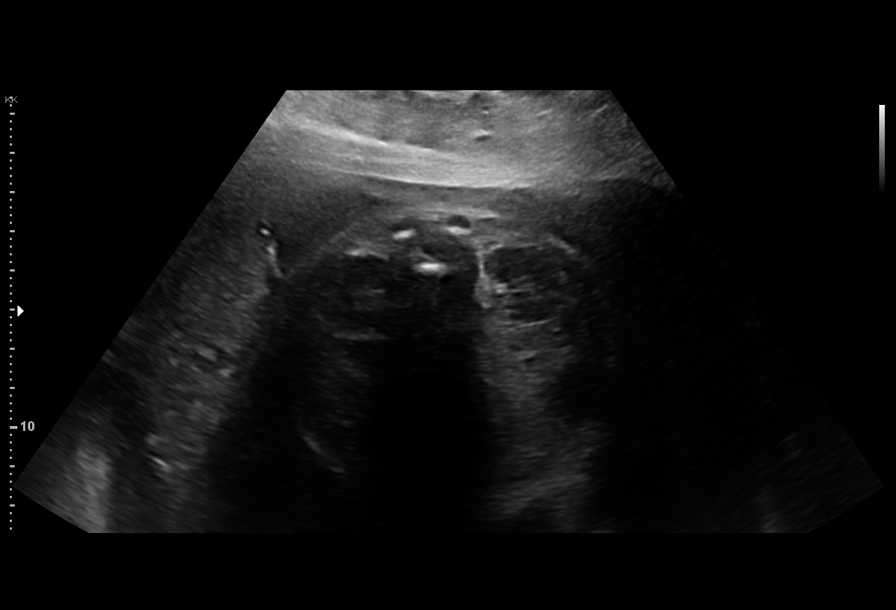
[im 13/38]
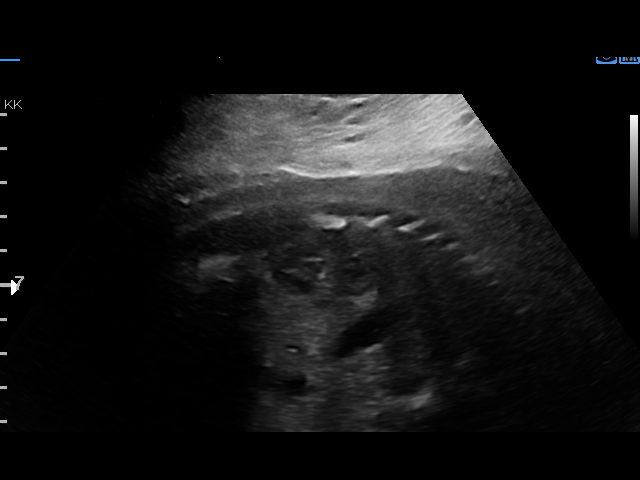
[im 16/38]
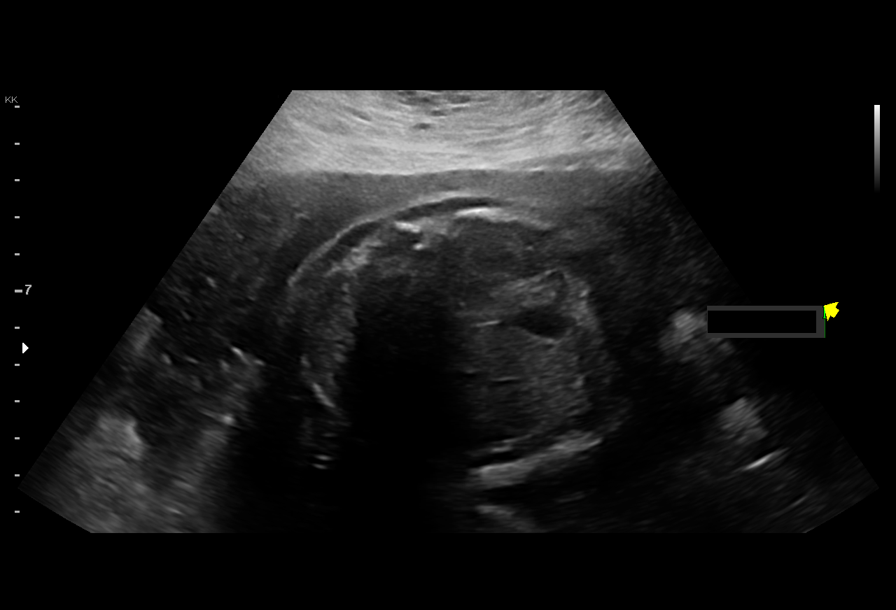
[im 20/38]
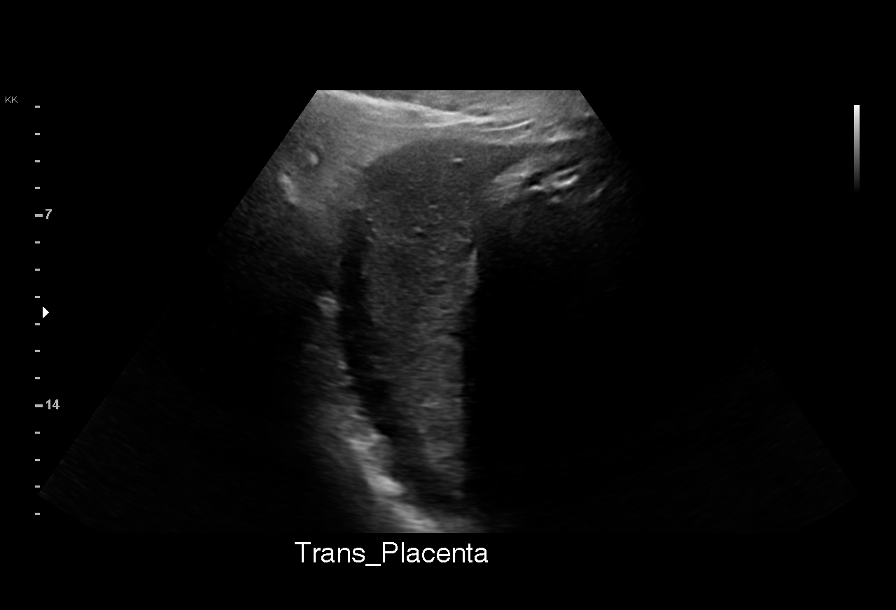
[im 22/38]
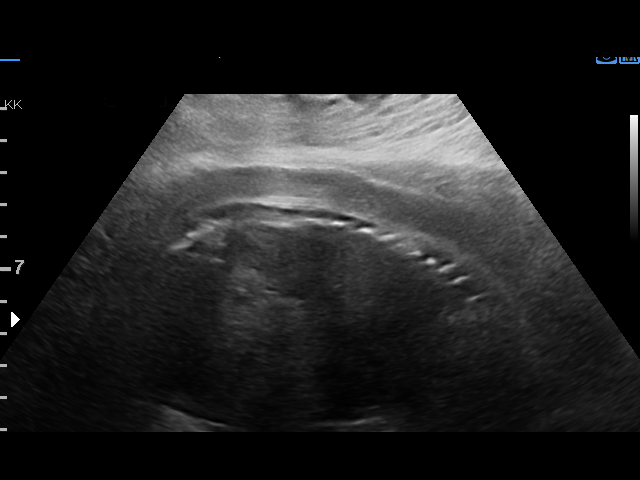
[im 25/38]
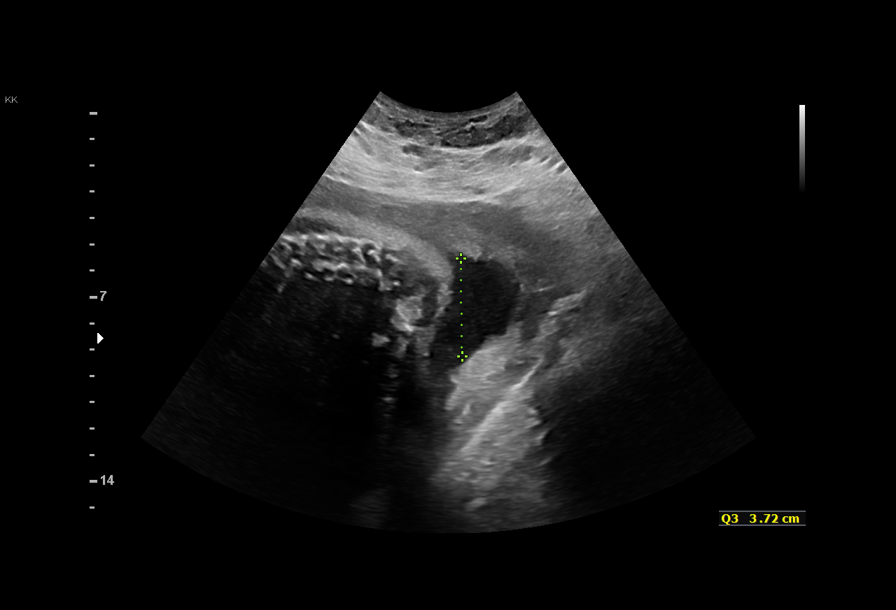
[im 28/38]
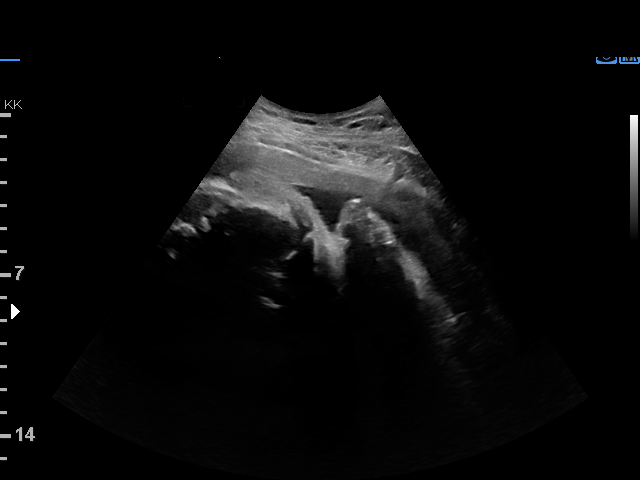
[im 31/38]
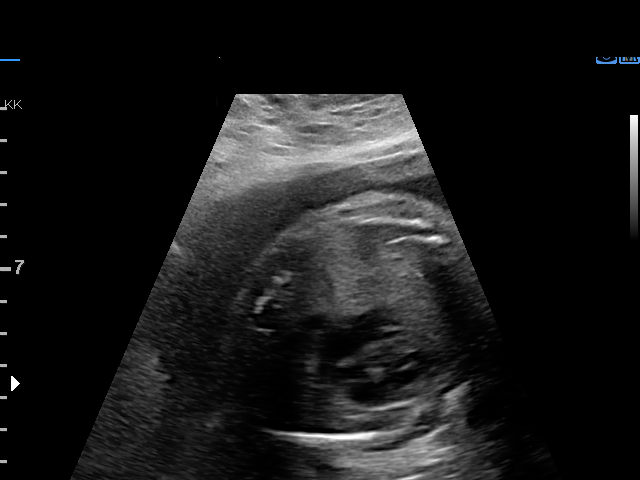
[im 33/38]
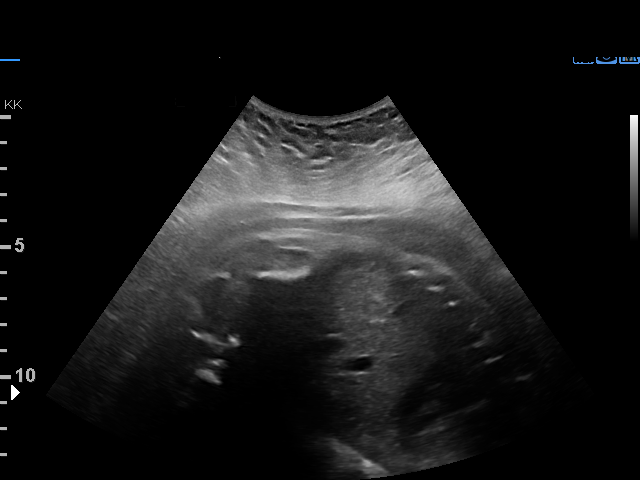
[im 36/38]
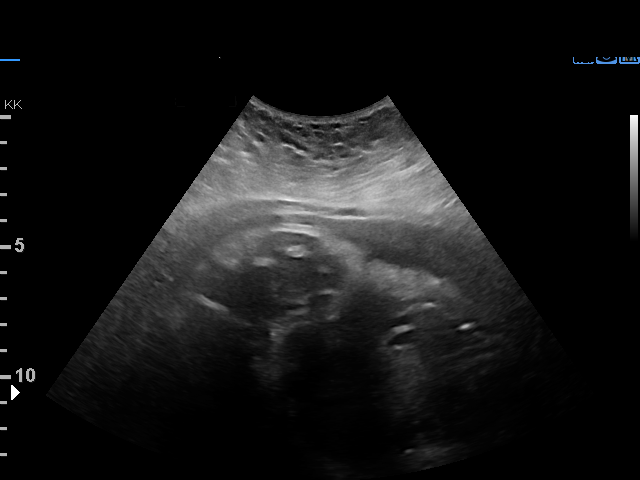

[13 of 28 positions shown; findings below may reference images not displayed]

[facility]                                   [HOSPITAL] at

Indications

 Obesity complicating pregnancy, third
 trimester
 Pre-existing hypertension with preeclampsia,
 third trimester
 Echogenic intracardiac focus of the heart
 (EIF)
 Seizure disorder
 Genetic carrier (Petromila Rendla silent carrier)
 32 weeks gestation of pregnancy
 LR NIPS
Fetal Evaluation

 Num Of Fetuses:         1
 Fetal Heart Rate(bpm):  153
 Cardiac Activity:       Observed
 Presentation:           Cephalic
 Placenta:               Posterior
 P. Cord Insertion:      Previously Visualized

 Amniotic Fluid
 AFI FV:      Within normal limits

 AFI Sum(cm)     %Tile       Largest Pocket(cm)
 14.8            52

 RUQ(cm)       RLQ(cm)       LUQ(cm)        LLQ(cm)

Biophysical Evaluation

 Amniotic F.V:   Pocket => 2 cm             F. Tone:        Observed
 F. Movement:    Observed                   Score:          [DATE]
 F. Breathing:   Observed
OB History

 Gravidity:    3         Term:   1
Gestational Age

 LMP:           32w 3d        Date:  07/25/20                 EDD:   05/01/21
 Best:          32w 3d     Det. By:  LMP  (07/25/20)          EDD:   05/01/21
Anatomy

 Diaphragm:             Appears normal         Bladder:                Appears normal
 Stomach:               Appears normal, left
                        sided
Comments

 This patient was seen for a biophysical profile due to chronic
 hypertension treated with labetalol and obesity with a BMI of
 49.  She denies any problems since her last exam.
 A biophysical profile performed today was [DATE].
 There was normal amniotic fluid noted on today's ultrasound
 exam.
 She will return in 1 week for another BPP.
